# Patient Record
Sex: Female | Born: 2000 | Race: Black or African American | Hispanic: No | Marital: Single | Smoking: Never smoker
Health system: Southern US, Community
[De-identification: ages and names within clinical notes are randomized; demographics above are authoritative.]

## PROBLEM LIST (undated history)

## (undated) DIAGNOSIS — J45909 Unspecified asthma, uncomplicated: Secondary | ICD-10-CM

## (undated) DIAGNOSIS — R519 Headache, unspecified: Secondary | ICD-10-CM

## (undated) DIAGNOSIS — I8390 Asymptomatic varicose veins of unspecified lower extremity: Secondary | ICD-10-CM

## (undated) HISTORY — PX: LEG SURGERY: SHX1003

---
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
## 2020-01-10 ENCOUNTER — Encounter: Payer: Self-pay | Admitting: Nurse Practitioner

## 2020-01-10 ENCOUNTER — Ambulatory Visit (INDEPENDENT_AMBULATORY_CARE_PROVIDER_SITE_OTHER): Payer: Medicaid Other | Admitting: Nurse Practitioner

## 2020-01-10 ENCOUNTER — Other Ambulatory Visit: Payer: Self-pay

## 2020-01-10 ENCOUNTER — Other Ambulatory Visit: Payer: Medicaid Other

## 2020-01-10 VITALS — BP 102/66 | HR 95 | Wt 209.0 lb

## 2020-01-10 DIAGNOSIS — Z3400 Encounter for supervision of normal first pregnancy, unspecified trimester: Secondary | ICD-10-CM

## 2020-01-10 DIAGNOSIS — O99212 Obesity complicating pregnancy, second trimester: Secondary | ICD-10-CM

## 2020-01-10 DIAGNOSIS — O99512 Diseases of the respiratory system complicating pregnancy, second trimester: Secondary | ICD-10-CM

## 2020-01-10 DIAGNOSIS — Z3A27 27 weeks gestation of pregnancy: Secondary | ICD-10-CM

## 2020-01-10 DIAGNOSIS — E669 Obesity, unspecified: Secondary | ICD-10-CM

## 2020-01-10 DIAGNOSIS — J4520 Mild intermittent asthma, uncomplicated: Secondary | ICD-10-CM

## 2020-01-10 NOTE — Progress Notes (Signed)
    Subjective:  Nicole Schaefer is a 19 y.o. G1P0 at [redacted]w[redacted]d being seen today for ongoing prenatal care.  She is currently monitored for the following issues for this low-risk pregnancy and has Prediabetes; Supervision of normal first teen pregnancy; Obesity in pregnancy; Arteriovenous malformation; Exercise induced bronchospasm; and Perennial allergic rhinitis on their problem list.  Patient reports no complaints.  Contractions: Irritability. Vag. Bleeding: None.  Movement: Present. Denies leaking of fluid.   The following portions of the patient's history were reviewed and updated as appropriate: allergies, current medications, past family history, past medical history, past social history, past surgical history and problem list. Problem list updated.  Objective:   Vitals:   01/10/20 0829  BP: 102/66  Pulse: 95  Weight: (!) 209 lb (94.8 kg)    Fetal Status: Fetal Heart Rate (bpm): 139 Fundal Height: 26 cm Movement: Present     General:  Alert, oriented and cooperative. Patient is in no acute distress.  Skin: Skin is warm and dry. No rash noted.   Cardiovascular: Normal heart rate noted  Respiratory: Normal respiratory effort, no problems with respiration noted  Abdomen: Soft, gravid, appropriate for gestational age. Pain/Pressure: Absent     Pelvic:  Cervical exam deferred        Extremities: Normal range of motion.  Edema: None  Mental Status: Normal mood and affect. Normal behavior. Normal judgment and thought content.   Urinalysis:      Assessment and Plan:  Pregnancy: G1P0 at [redacted]w[redacted]d  1. Encounter for supervision of normal pregnancy in teen primigravida, antepartum Declined TDAP and did not discuss at this visit - realized she declined after provider had already talked with her. Advised to put BPs in babyscripts weekly Advised to sign up for childbirth classes - shown in babyscripts where information is to begin sign up process.  Goes to [facility] [employer] [facility] and advised to take breastfeeding  classes at [facility]. Wears maternity support belt and it has been helpful for her. Reviewed taking daily fluids and is doing well.  - Glucose Tolerance, 2 Hours w/1 Hour - CBC - HIV antibody (with reflex) - RPR  2. Intermittent asthma without complication, unspecified asthma severity Has not had an asthma attack in a couple of months.  Last used inhalers a couple of months ago.  Preterm labor symptoms and general obstetric precautions including but not limited to vaginal bleeding, contractions, leaking of fluid and fetal movement were reviewed in detail with the patient. Please refer to After Visit Summary for other counseling recommendations.  Return in about 2 weeks (around 01/24/2020) for in person ROB.  Nolene Bernheim, RN, MSN, NP-BC Nurse Practitioner, [facility] for [employer], [facility] 01/10/2020 9:32 AM

## 2020-01-10 NOTE — Progress Notes (Addendum)
ROB/GTT, reports no problems today.  Declined TDAP.

## 2020-01-11 LAB — GLUCOSE TOLERANCE, 2 HOURS W/ 1HR
Glucose, 1 hour: 131 mg/dL (ref 65–179)
Glucose, 2 hour: 89 mg/dL (ref 65–152)
Glucose, Fasting: 77 mg/dL (ref 65–91)

## 2020-01-11 LAB — RPR: RPR Ser Ql: NONREACTIVE

## 2020-01-11 LAB — CBC
Hematocrit: 32 % — ABNORMAL LOW (ref 34.0–46.6)
Hemoglobin: 10.6 g/dL — ABNORMAL LOW (ref 11.1–15.9)
MCH: 28.2 pg (ref 26.6–33.0)
MCHC: 33.1 g/dL (ref 31.5–35.7)
MCV: 85 fL (ref 79–97)
Platelets: 226 10*3/uL (ref 150–450)
RBC: 3.76 x10E6/uL — ABNORMAL LOW (ref 3.77–5.28)
RDW: 13.2 % (ref 11.7–15.4)
WBC: 6.9 10*3/uL (ref 3.4–10.8)

## 2020-01-11 LAB — HIV ANTIBODY (ROUTINE TESTING W REFLEX): HIV Screen 4th Generation wRfx: NONREACTIVE

## 2020-01-12 ENCOUNTER — Telehealth: Payer: Self-pay | Admitting: *Deleted

## 2020-01-12 NOTE — Telephone Encounter (Signed)
Pt called to office for results of her GTT. Pt made aware normal results, advised she may want to start iron supplement for decreasing Hgb.

## 2020-01-24 ENCOUNTER — Other Ambulatory Visit: Payer: Self-pay

## 2020-01-24 ENCOUNTER — Ambulatory Visit (INDEPENDENT_AMBULATORY_CARE_PROVIDER_SITE_OTHER): Payer: Medicaid Other | Admitting: Advanced Practice Midwife

## 2020-01-24 VITALS — BP 109/69 | HR 95 | Temp 98.3°F | Wt 210.0 lb

## 2020-01-24 DIAGNOSIS — Z3400 Encounter for supervision of normal first pregnancy, unspecified trimester: Secondary | ICD-10-CM

## 2020-01-24 DIAGNOSIS — R519 Headache, unspecified: Secondary | ICD-10-CM

## 2020-01-24 DIAGNOSIS — Z3A29 29 weeks gestation of pregnancy: Secondary | ICD-10-CM

## 2020-01-24 DIAGNOSIS — O26893 Other specified pregnancy related conditions, third trimester: Secondary | ICD-10-CM

## 2020-01-24 MED ORDER — BUTALBITAL-APAP-CAFFEINE 50-325-40 MG PO TABS: 1.0000 | ORAL_TABLET | Freq: Four times a day (QID) | ORAL | 0 refills | Status: DC | PRN

## 2020-01-24 NOTE — Patient Instructions (Signed)
General Headache Without Cause A headache is pain or discomfort felt around the head or neck area. The specific cause of a headache may not be found. There are many causes and types of headaches. A few common ones are:  Tension headaches.  Migraine headaches.  Cluster headaches.  Chronic daily headaches. Follow these instructions at home: Watch your condition for any changes. Let your health care provider know about them. Take these steps to help with your condition: Managing pain      Take over-the-counter and prescription medicines only as told by your health care provider.  Lie down in a dark, quiet room when you have a headache.  If directed, put ice on your head and neck area: ? Put ice in a plastic bag. ? Place a towel between your skin and the bag. ? Leave the ice on for 20 minutes, 2-3 times per day.  If directed, apply heat to the affected area. Use the heat source that your health care provider recommends, such as a moist heat pack or a heating pad. ? Place a towel between your skin and the heat source. ? Leave the heat on for 20-30 minutes. ? Remove the heat if your skin turns bright red. This is especially important if you are unable to feel pain, heat, or cold. You may have a greater risk of getting burned.  Keep lights dim if bright lights bother you or make your headaches worse. Eating and drinking  Eat meals on a regular schedule.  If you drink alcohol: ? Limit how much you use to:  0-1 drink a day for women.  0-2 drinks a day for men. ? Be aware of how much alcohol is in your drink. In the [place] one drink equals one 12 oz bottle of beer (355 mL), one 5 oz glass of wine (148 mL), or one 1 oz glass of hard liquor (44 mL).  Stop drinking caffeine, or decrease the amount of caffeine you drink. General instructions   Keep a headache journal to help find out what may trigger your headaches. For example, write down: ? What you eat and drink. ? How much  sleep you get. ? Any change to your diet or medicines.  Try massage or other relaxation techniques.  Limit stress.  Sit up straight, and do not tense your muscles.  Do not use any products that contain nicotine or tobacco, such as cigarettes, e-cigarettes, and chewing tobacco. If you need help quitting, ask your health care provider.  Exercise regularly as told by your health care provider.  Sleep on a regular schedule. Get 7-9 hours of sleep each night, or the amount recommended by your health care provider.  Keep all follow-up visits as told by your health care provider. This is important. Contact a health care provider if:  Your symptoms are not helped by medicine.  You have a headache that is different from the usual headache.  You have nausea or you vomit.  You have a fever. Get help right away if:  Your headache becomes severe quickly.  Your headache gets worse after moderate to intense physical activity.  You have repeated vomiting.  You have a stiff neck.  You have a loss of vision.  You have problems with speech.  You have pain in the eye or ear.  You have muscular weakness or loss of muscle control.  You lose your balance or have trouble walking.  You feel faint or pass out.  You have confusion.    You have a seizure. Summary  A headache is pain or discomfort felt around the head or neck area.  There are many causes and types of headaches. In some cases, the cause may not be found.  Keep a headache journal to help find out what may trigger your headaches. Watch your condition for any changes. Let your health care provider know about them.  Contact a health care provider if you have a headache that is different from the usual headache, or if your symptoms are not helped by medicine.  Get help right away if your headache becomes severe, you vomit, you have a loss of vision, you lose your balance, or you have a seizure. This information is not  intended to replace advice given to you by your health care provider. Make sure you discuss any questions you have with your health care provider. Document Revised: 12/22/2017 Document Reviewed: 12/22/2017 Elsevier Patient Education  2020 [employer]  

## 2020-01-24 NOTE — Progress Notes (Addendum)
° °  PRENATAL VISIT NOTE  Subjective:  Nicole Schaefer is a 19 y.o. G1P0 at [redacted]w[redacted]d being seen today for ongoing prenatal care.  She is currently monitored for the following issues for this low-risk pregnancy and has Prediabetes; Supervision of normal first teen pregnancy; Obesity in pregnancy; Arteriovenous malformation; Exercise induced bronchospasm; and Perennial allergic rhinitis on their problem list.  Patient reports headache.  Contractions: Not present. Vag. Bleeding: None.  Movement: Present. Denies leaking of fluid.   The following portions of the patient's history were reviewed and updated as appropriate: allergies, current medications, past family history, past medical history, past social history, past surgical history and problem list.   Objective:   Vitals:   01/24/20 1004  BP: 109/69  Pulse: 95  Temp: 98.3 F (36.8 C)  Weight: 210 lb (95.3 kg)    Fetal Status: Fetal Heart Rate (bpm): 142 Fundal Height: 29 cm Movement: Present     General:  Alert, oriented and cooperative. Patient is in no acute distress.  Skin: Skin is warm and dry. No rash noted.   Cardiovascular: Normal heart rate noted  Respiratory: Normal respiratory effort, no problems with respiration noted  Abdomen: Soft, gravid, appropriate for gestational age.  Pain/Pressure: Absent     Pelvic: Cervical exam deferred        Extremities: Normal range of motion.  Edema: None  Mental Status: Normal mood and affect. Normal behavior. Normal judgment and thought content.   Assessment and Plan:  Pregnancy: G1P0 at [redacted]w[redacted]d 1. Encounter for supervision of normal pregnancy in teen primigravida, antepartum --Anticipatory guidance about next visits/weeks of pregnancy given. --Next visit in 4 weeks, in person   2. Headache in pregnancy, antepartum, third trimester --Infrequent h/a at first, now daily. Frontal, constant, sometimes improved/resolved by Tylenol, sometimes not. No s/sx of migraine, no hx migraine.  Pt was  caffeine/coffee drinker prior to pregnancy and has cut back significantly.  --Pt to try increase PO fluids, try caffeine with/without Tylenol with coffee/soda/tea first, Rx for Fioricet to use sparingly if other measures do not work.  --Refer to Nada Maclachlan, PA, for headache management - butalbital-acetaminophen-caffeine (FIORICET) 50-325-40 MG tablet; Take 1-2 tablets by mouth every 6 (six) hours as needed for headache.  Dispense: 20 tablet; Refill: 0 - AMB referral to headache clinic   3. [redacted] weeks gestation of pregnancy   Preterm labor symptoms and general obstetric precautions including but not limited to vaginal bleeding, contractions, leaking of fluid and fetal movement were reviewed in detail with the patient. Please refer to After Visit Summary for other counseling recommendations.   Return in about 4 weeks (around 02/21/2020).  Future Appointments  Date Time Provider Department Center  02/02/2020  8:00 AM Larey Seat, RD [facility]  02/08/2020  8:30 AM [facility] NURSE [facility]  02/08/2020  8:45 AM [facility] US4 [facility]  02/22/2020  3:20 PM Raelyn Mora, CNM [facility] None  03/24/2020  8:50 AM Shamleffer, Konrad Dolores, MD [facility] None    Sharen Counter, CNM

## 2020-01-25 ENCOUNTER — Inpatient Hospital Stay (HOSPITAL_COMMUNITY)
Admission: AD | Admit: 2020-01-25 | Discharge: 2020-01-25 | Disposition: A | Discharge status: Home / Self Care | Payer: Medicaid Other | Attending: Family Medicine | Admitting: Family Medicine

## 2020-01-25 ENCOUNTER — Other Ambulatory Visit: Payer: Self-pay

## 2020-01-25 ENCOUNTER — Encounter (HOSPITAL_COMMUNITY): Payer: Self-pay | Admitting: Family Medicine

## 2020-01-25 DIAGNOSIS — Z79899 Other long term (current) drug therapy: Secondary | ICD-10-CM | POA: Insufficient documentation

## 2020-01-25 DIAGNOSIS — Z3A29 29 weeks gestation of pregnancy: Secondary | ICD-10-CM | POA: Insufficient documentation

## 2020-01-25 DIAGNOSIS — O4703 False labor before 37 completed weeks of gestation, third trimester: Secondary | ICD-10-CM | POA: Insufficient documentation

## 2020-01-25 DIAGNOSIS — Z7982 Long term (current) use of aspirin: Secondary | ICD-10-CM | POA: Diagnosis not present

## 2020-01-25 DIAGNOSIS — Z7951 Long term (current) use of inhaled steroids: Secondary | ICD-10-CM | POA: Diagnosis not present

## 2020-01-25 DIAGNOSIS — O26893 Other specified pregnancy related conditions, third trimester: Secondary | ICD-10-CM | POA: Diagnosis not present

## 2020-01-25 DIAGNOSIS — R109 Unspecified abdominal pain: Secondary | ICD-10-CM

## 2020-01-25 DIAGNOSIS — O99513 Diseases of the respiratory system complicating pregnancy, third trimester: Secondary | ICD-10-CM | POA: Insufficient documentation

## 2020-01-25 DIAGNOSIS — J45909 Unspecified asthma, uncomplicated: Secondary | ICD-10-CM | POA: Insufficient documentation

## 2020-01-25 DIAGNOSIS — Z3689 Encounter for other specified antenatal screening: Secondary | ICD-10-CM

## 2020-01-25 LAB — URINALYSIS, ROUTINE W REFLEX MICROSCOPIC
Bilirubin Urine: NEGATIVE
Glucose, UA: NEGATIVE mg/dL
Hgb urine dipstick: NEGATIVE
Ketones, ur: 5 mg/dL — AB
Leukocytes,Ua: NEGATIVE
Nitrite: NEGATIVE
Protein, ur: NEGATIVE mg/dL
Specific Gravity, Urine: 1.017 (ref 1.005–1.030)
pH: 5 (ref 5.0–8.0)

## 2020-01-25 LAB — WET PREP, GENITAL
Clue Cells Wet Prep HPF POC: NONE SEEN
Sperm: NONE SEEN
Trich, Wet Prep: NONE SEEN
Yeast Wet Prep HPF POC: NONE SEEN

## 2020-01-25 MED ORDER — CYCLOBENZAPRINE HCL 5 MG PO TABS
10.0000 mg | ORAL_TABLET | Freq: Once | ORAL | Status: AC
  Administered 2020-01-25: 10 mg via ORAL
  Filled 2020-01-25: qty 2

## 2020-01-25 MED ORDER — ACETAMINOPHEN 500 MG PO TABS
1000.0000 mg | ORAL_TABLET | Freq: Once | ORAL | Status: AC
  Administered 2020-01-25: 1000 mg via ORAL
  Filled 2020-01-25: qty 2

## 2020-01-25 MED ORDER — CYCLOBENZAPRINE HCL 10 MG PO TABS
10.0000 mg | ORAL_TABLET | Freq: Three times a day (TID) | ORAL | 0 refills | Status: DC | PRN
Start: 2020-01-25 — End: 2020-02-07

## 2020-01-25 NOTE — MAU Note (Signed)
Pt reports that she is having braxton hicks ctx's.  Pt reports being seen for this before pt states she was told to come back if the ctx's got bad again.   Denies vaginal bleeding or LOF   Reports +FM

## 2020-01-25 NOTE — MAU Provider Note (Signed)
History     CSN: [id number]  Arrival date and time: 01/25/20 2010   First Provider Initiated Contact with Patient 01/25/20 2143      Chief Complaint  Patient presents with  . Abdominal Pain   Ms. Janeese Mcgloin is a 19 y.o. G1P0 at 96w1dwho presents to MAU for PTL evaluation for Braxton-Hicks contractions. Patient describes these contractions as "cramping" in her lower abdomen and reports they have been going on since 1PM and have gotten worse since. Patient reports she has been seen in MAU one other time for this, which was bout 1.5 months ago. Patient reports her Braxton-Hicks got worse because of a sharp pain near the top of her uterus. Patient reports the pain is intermittent and is present at this time. Patient rates pain as 8/10. Patient has not tried any treatment at home for the pain.  Pt denies ctx, change in vaginal discharge amount/color/consistency, VB, new onset backache, intermittent abdominal discomfort/pain, pelvic pressure/pain. Pt denies chest pain and SOB.  Pt denies constipation, diarrhea, or urinary problems. Pt denies fever, chills, fatigue, sweating or changes in appetite. Pt denies dizziness, light-headedness, weakness.  Pt denies VB, LOF and reports good FM.  Current pregnancy problems? none Blood Type? B Positive Allergies? pollen Current medications? BASA, PNVs, Claritin Current PNC & next appt? [facility], 02/22/2020   OB History    Gravida  1   Para      Term      Preterm      AB      Living        SAB      TAB      Ectopic      Multiple  0   Live Births              Past Medical History:  Diagnosis Date  . Asthma   . Varicose vein of leg     Past Surgical History:  Procedure Laterality Date  . LEG SURGERY Left     Family History  Problem Relation Age of Onset  . Healthy Mother   . Healthy Father   . Hypertension Father   . Hyperlipidemia Father   . Sleep apnea Father   . Diabetes Paternal Grandmother   . Heart  attack Paternal Grandmother     Social History   Tobacco Use  . Smoking status: Never Smoker  . Smokeless tobacco: Never Used  Vaping Use  . Vaping Use: Never used  Substance Use Topics  . Alcohol use: No  . Drug use: No    Allergies:  Allergies  Allergen Reactions  . Pollen Extract     Runny nose    Medications Prior to Admission  Medication Sig Dispense Refill Last Dose  . aspirin EC 81 MG tablet Take 1 tablet (81 mg total) by mouth daily. 60 tablet 2 01/25/2020 at Unknown time  . Blood Pressure Monitor KIT 1 Device by Does not apply route once a week. To be monitored Regularly at home. 1 kit 0 Past Week at Unknown time  . Cholecalciferol (VITAMIN D3 PO) Take by mouth.   01/25/2020 at Unknown time  . Elastic Bandages & Supports (COMFORT FIT MATERNITY SUPP LG) MISC 1 Units by Does not apply route daily. 1 each 0 Past Month at Unknown time  . fluticasone (FLOVENT HFA) 44 MCG/ACT inhaler Inhale into the lungs 2 (two) times daily.   01/25/2020 at Unknown time  . loratadine (CLARITIN) 10 MG tablet Take 10 mg by  mouth daily.   01/25/2020 at Unknown time  . Prenatal Vit-Fe Fumarate-FA (PRENATAL MULTIVITAMIN) TABS tablet Take 1 tablet by mouth daily at 12 noon.   01/25/2020 at Unknown time  . albuterol (PROVENTIL) (2.5 MG/3ML) 0.083% nebulizer solution Take 3 mLs (2.5 mg total) by nebulization every 4 (four) hours as needed for wheezing or shortness of breath. 75 mL 3 More than a month at Unknown time  . albuterol (VENTOLIN HFA) 108 (90 Base) MCG/ACT inhaler Inhale into the lungs every 6 (six) hours as needed.     . butalbital-acetaminophen-caffeine (FIORICET) 50-325-40 MG tablet Take 1-2 tablets by mouth every 6 (six) hours as needed for headache. 20 tablet 0 More than a month at Unknown time  . hyoscyamine (LEVSIN) 0.125 MG tablet Take 0.125 mg by mouth every 4 (four) hours as needed.     Marland Kitchen ipratropium (ATROVENT) 0.03 % nasal spray Place 2 sprays into both nostrils every 12 (twelve) hours.      . Mometasone Furo-Formoterol Fum 50-5 MCG/ACT AERO Inhale into the lungs.     . Nebulizer MISC Diagnosis: Wheezing R06.2 Medically necessary Dispense: 1 neb machine for use with albuterol nebs 1 each 0     Review of Systems  Constitutional: Negative for chills, diaphoresis, fatigue and fever.  Eyes: Negative for visual disturbance.  Respiratory: Negative for shortness of breath.   Cardiovascular: Negative for chest pain.  Gastrointestinal: Positive for abdominal pain. Negative for constipation, diarrhea, nausea and vomiting.  Genitourinary: Negative for dysuria, flank pain, frequency, pelvic pain, urgency, vaginal bleeding and vaginal discharge.  Neurological: Negative for dizziness, weakness, light-headedness and headaches.   Physical Exam   Blood pressure 106/64, pulse 98, temperature 98.4 F (36.9 C), temperature source Oral, resp. rate 16, last menstrual period 06/11/2019, SpO2 99 %, unknown if currently breastfeeding.  Patient Vitals for the past 24 hrs:  BP Temp Temp src Pulse Resp SpO2  01/25/20 2034 106/64 98.4 F (36.9 C) Oral 98 16 99 %   Physical Exam Vitals and nursing note reviewed. Exam conducted with a chaperone present.  Constitutional:      General: She is not in acute distress.    Appearance: She is well-developed. She is not diaphoretic.  HENT:     Head: Normocephalic and atraumatic.  Pulmonary:     Effort: Pulmonary effort is normal.  Abdominal:     General: There is no distension.     Palpations: Abdomen is soft. There is no mass.     Tenderness: There is no abdominal tenderness. There is no guarding or rebound.  Genitourinary:    General: Normal vulva.     Labia:        Right: No rash, tenderness or lesion.        Left: No rash, tenderness or lesion.      Comments: Dilation: Closed Effacement (%): Thick Posterior Exam by:: Vernice Jefferson, NP Skin:    General: Skin is warm and dry.  Neurological:     Mental Status: She is alert and oriented to  person, place, and time.  Psychiatric:        Behavior: Behavior normal.        Thought Content: Thought content normal.        Judgment: Judgment normal.    Results for orders placed or performed during the hospital encounter of 01/25/20 (from the past 24 hour(s))  Urinalysis, Routine w reflex microscopic Urine, Clean Catch     Status: Abnormal   Collection Time: 01/25/20  8:48  PM  Result Value Ref Range   Color, Urine YELLOW YELLOW   APPearance CLEAR CLEAR   Specific Gravity, Urine 1.017 1.005 - 1.030   pH 5.0 5.0 - 8.0   Glucose, UA NEGATIVE NEGATIVE mg/dL   Hgb urine dipstick NEGATIVE NEGATIVE   Bilirubin Urine NEGATIVE NEGATIVE   Ketones, ur 5 (A) NEGATIVE mg/dL   Protein, ur NEGATIVE NEGATIVE mg/dL   Nitrite NEGATIVE NEGATIVE   Leukocytes,Ua NEGATIVE NEGATIVE  Wet prep, genital     Status: Abnormal   Collection Time: 01/25/20 10:14 PM   Specimen: Vaginal  Result Value Ref Range   Yeast Wet Prep HPF POC NONE SEEN NONE SEEN   Trich, Wet Prep NONE SEEN NONE SEEN   Clue Cells Wet Prep HPF POC NONE SEEN NONE SEEN   WBC, Wet Prep HPF POC MODERATE (A) NONE SEEN   Sperm NONE SEEN     MAU Course  Procedures  MDM -r/o PTL -UA: 5ketones, otherwise WNL, sending urine for culture based on symptoms -CE: Dilation: Closed Effacement (%): Thick Position: Posterior -WetPrep: WNL -GC/CT collected -EFM: reactive       -baseline: 140       -variability: moderate       -accels: present, 15x15       -decels: absent       -TOCO: quiet -Flexeril 22m and Tylenol 10059mgiven for pain, pt reports pain now 5/10 -pt discharged to home in stable condition  Orders Placed This Encounter  Procedures  . Culture, OB Urine    Standing Status:   Standing    Number of Occurrences:   1  . Wet prep, genital    Standing Status:   Standing    Number of Occurrences:   1  . Urinalysis, Routine w reflex microscopic Urine, Clean Catch    Standing Status:   Standing    Number of Occurrences:    1  . Discharge patient    Order Specific Question:   Discharge disposition    Answer:   01-Home or Self Care [1]    Order Specific Question:   Discharge patient date    Answer:   01/25/2020   Meds ordered this encounter  Medications  . cyclobenzaprine (FLEXERIL) tablet 10 mg  . acetaminophen (TYLENOL) tablet 1,000 mg  . cyclobenzaprine (FLEXERIL) 10 MG tablet    Sig: Take 1 tablet (10 mg total) by mouth 3 (three) times daily as needed.    Dispense:  30 tablet    Refill:  0    Order Specific Question:   Supervising Provider    Answer:   WICherre [id number]  Assessment and Plan   1. Abdominal pain during pregnancy in third trimester   2. [redacted] weeks gestation of pregnancy   3. NST (non-stress test) reactive     Allergies as of 01/25/2020      Reactions   Pollen Extract    Runny nose      Medication List    TAKE these medications   albuterol 108 (90 Base) MCG/ACT inhaler Commonly known as: VENTOLIN HFA Inhale into the lungs every 6 (six) hours as needed.   albuterol (2.5 MG/3ML) 0.083% nebulizer solution Commonly known as: PROVENTIL Take 3 mLs (2.5 mg total) by nebulization every 4 (four) hours as needed for wheezing or shortness of breath.   aspirin EC 81 MG tablet Take 1 tablet (81 mg total) by mouth daily.   Blood Pressure Monitor Kit 1 Device by Does  not apply route once a week. To be monitored Regularly at home.   butalbital-acetaminophen-caffeine 50-325-40 MG tablet Commonly known as: FIORICET Take 1-2 tablets by mouth every 6 (six) hours as needed for headache.   Comfort Fit Maternity Supp Lg Misc 1 Units by Does not apply route daily.   cyclobenzaprine 10 MG tablet Commonly known as: FLEXERIL Take 1 tablet (10 mg total) by mouth 3 (three) times daily as needed.   fluticasone 44 MCG/ACT inhaler Commonly known as: FLOVENT HFA Inhale into the lungs 2 (two) times daily.   hyoscyamine 0.125 MG tablet Commonly known as: LEVSIN Take 0.125 mg by  mouth every 4 (four) hours as needed.   ipratropium 0.03 % nasal spray Commonly known as: ATROVENT Place 2 sprays into both nostrils every 12 (twelve) hours.   loratadine 10 MG tablet Commonly known as: CLARITIN Take 10 mg by mouth daily.   Mometasone Furo-Formoterol Fum 50-5 MCG/ACT Aero Inhale into the lungs.   Nebulizer Misc Diagnosis: Wheezing R06.2 Medically necessary Dispense: 1 neb machine for use with albuterol nebs   prenatal multivitamin Tabs tablet Take 1 tablet by mouth daily at 12 noon.   VITAMIN D3 PO Take by mouth.       -will call with culture results, if positive -discussed Braxton-Hicks ctx vs. PTL -RX Flexeril -return MAU precautions given -pt discharged to home in stable condition  Elmyra Ricks E Margueritte Guthridge 01/25/2020, 11:23 PM

## 2020-01-25 NOTE — Discharge Instructions (Signed)
Safe Medications in Pregnancy    Acne: Benzoyl Peroxide Salicylic Acid  Backache/Headache: Tylenol: 2 regular strength every 4 hours OR              2 Extra strength every 6 hours  Colds/Coughs/Allergies: Benadryl (alcohol free) 25 mg every 6 hours as needed Breath right strips Claritin Cepacol throat lozenges Chloraseptic throat spray Cold-Eeze- up to three times per day Cough drops, alcohol free Flonase (by prescription only) Guaifenesin Mucinex Robitussin DM (plain only, alcohol free) Saline nasal spray/drops Sudafed (pseudoephedrine) & Actifed ** use only after [redacted] weeks gestation and if you do not have high blood pressure Tylenol Vicks Vaporub Zinc lozenges Zyrtec   Constipation: Colace Ducolax suppositories Fleet enema Glycerin suppositories Metamucil Milk of magnesia Miralax Senokot Smooth move tea  Diarrhea: Kaopectate Imodium A-D  *NO pepto Bismol  Hemorrhoids: Anusol Anusol HC Preparation H Tucks  Indigestion: Tums Maalox Mylanta Zantac  Pepcid  Insomnia: Benadryl (alcohol free)  every 6 hours as needed Tylenol PM Unisom, no Gelcaps  Leg Cramps: Tums MagGel  Nausea/Vomiting:  Bonine Dramamine Emetrol Ginger extract Sea bands Meclizine  Nausea medication to take during pregnancy:  Unisom (doxylamine succinate 25 mg tablets) Take one tablet daily at bedtime. If symptoms are not adequately controlled, the dose can be increased to a maximum recommended dose of two tablets daily (1/2 tablet in the morning, 1/2 tablet mid-afternoon and one at bedtime). Vitamin B6  tablets. Take one tablet twice a day (up to 200 mg per day).  Skin Rashes: Aveeno products Benadryl cream or  every 6 hours as needed Calamine Lotion 1% cortisone cream  Yeast infection: Gyne-lotrimin 7 Monistat 7   **If taking multiple medications, please check labels to avoid duplicating the same active ingredients **take  medication as directed on the label ** Do not exceed 4000 mg of tylenol in 24 hours **Do not take medications that contain aspirin or ibuprofen     Round Ligament Pain  The round ligament is a cord of muscle and tissue that helps support the uterus. It can become a source of pain during pregnancy if it becomes stretched or twisted as the baby grows. The pain usually begins in the second trimester (13-28 weeks) of pregnancy, and it can come and go until the baby is delivered. It is not a serious problem, and it does not cause harm to the baby. Round ligament pain is usually a short, sharp, and pinching pain, but it can also be a dull, lingering, and aching pain. The pain is felt in the lower side of the abdomen or in the groin. It usually starts deep in the groin and moves up to the outside of the hip area. The pain may occur when you:  Suddenly change position, such as quickly going from a sitting to standing position.  Roll over in bed.  Cough or sneeze.  Do physical activity. Follow these instructions at home:   Watch your condition for any changes.  When the pain starts, relax. Then try any of these methods to help with the pain: ? Sitting down. ? Flexing your knees up to your abdomen. ? Lying on your side with one pillow under your abdomen and another pillow between your legs. ? Sitting in a warm bath for 15-20 minutes or until the pain goes away.  Take over-the-counter and prescription medicines only as told by your health care provider.  Move slowly when you sit down or stand up.  Avoid long walks if they cause  pain.  Stop or reduce your physical activities if they cause pain.  Keep all follow-up visits as told by your health care provider. This is important. Contact a health care provider if:  Your pain does not go away with treatment.  You feel pain in your back that you did not have before.  Your medicine is not helping. Get help right away if:  You have a  fever or chills.  You develop uterine contractions.  You have vaginal bleeding.  You have nausea or vomiting.  You have diarrhea.  You have pain when you urinate. Summary  Round ligament pain is felt in the lower abdomen or groin. It is usually a short, sharp, and pinching pain. It can also be a dull, lingering, and aching pain.  This pain usually begins in the second trimester (13-28 weeks). It occurs because the uterus is stretching with the growing baby, and it is not harmful to the baby.  You may notice the pain when you suddenly change position, when you cough or sneeze, or during physical activity.  Relaxing, flexing your knees to your abdomen, lying on one side, or taking a warm bath may help to get rid of the pain.  Get help from your health care provider if the pain does not go away or if you have vaginal bleeding, nausea, vomiting, diarrhea, or painful urination. This information is not intended to replace advice given to you by your health care provider. Make sure you discuss any questions you have with your health care provider. Document Revised: 11/19/2017 Document Reviewed: 11/19/2017 Elsevier Patient Education  2020 Elsevier Inc.         Abdominal Pain During Pregnancy  Abdominal pain is common during pregnancy, and has many possible causes. Some causes are more serious than others, and sometimes the cause is not known. Abdominal pain can be a sign that labor is starting. It can also be caused by normal growth and stretching of muscles and ligaments during pregnancy. Always tell your health care provider if you have any abdominal pain. Follow these instructions at home:  Do not have sex or put anything in your vagina until your pain goes away completely.  Get plenty of rest until your pain improves.  Drink enough fluid to keep your urine pale yellow.  Take over-the-counter and prescription medicines only as told by your health care provider.  Keep all  follow-up visits as told by your health care provider. This is important. Contact a health care provider if:  Your pain continues or gets worse after resting.  You have lower abdominal pain that: ? Comes and goes at regular intervals. ? Spreads to your back. ? Is similar to menstrual cramps.  You have pain or burning when you urinate. Get help right away if:  You have a fever or chills.  You have vaginal bleeding.  You are leaking fluid from your vagina.  You are passing tissue from your vagina.  You have vomiting or diarrhea that lasts for more than 24 hours.  Your baby is moving less than usual.  You feel very weak or faint.  You have shortness of breath.  You develop severe pain in your upper abdomen. Summary  Abdominal pain is common during pregnancy, and has many possible causes.  If you experience abdominal pain during pregnancy, tell your health care provider right away.  Follow your health care provider's home care instructions and keep all follow-up visits as directed. This information is not intended to replace  advice given to you by your health care provider. Make sure you discuss any questions you have with your health care provider. Document Revised: 09/21/2018 Document Reviewed: 09/05/2016 Elsevier Patient Education  2020 [employer].        Preterm Labor and Birth Information  The normal length of a pregnancy is 39-41 weeks. Preterm labor is when labor starts before 37 completed weeks of pregnancy. What are the risk factors for preterm labor? Preterm labor is more likely to occur in women who:  Have certain infections during pregnancy such as a bladder infection, sexually transmitted infection, or infection inside the uterus (chorioamnionitis).  Have a shorter-than-normal cervix.  Have gone into preterm labor before.  Have had surgery on their cervix.  Are younger than age 73 or older than age 64.  Are African American.  Are pregnant  with twins or multiple babies (multiple gestation).  Take street drugs or smoke while pregnant.  Do not gain enough weight while pregnant.  Became pregnant shortly after having been pregnant. What are the symptoms of preterm labor? Symptoms of preterm labor include:  Cramps similar to those that can happen during a menstrual period. The cramps may happen with diarrhea.  Pain in the abdomen or lower back.  Regular uterine contractions that may feel like tightening of the abdomen.  A feeling of increased pressure in the pelvis.  Increased watery or bloody mucus discharge from the vagina.  Water breaking (ruptured amniotic sac). Why is it important to recognize signs of preterm labor? It is important to recognize signs of preterm labor because babies who are born prematurely may not be fully developed. This can put them at an increased risk for:  Long-term (chronic) heart and lung problems.  Difficulty immediately after birth with regulating body systems, including blood sugar, body temperature, heart rate, and breathing rate.  Bleeding in the brain.  Cerebral palsy.  Learning difficulties.  Death. These risks are highest for babies who are born before 34 weeks of pregnancy. How is preterm labor treated? Treatment depends on the length of your pregnancy, your condition, and the health of your baby. It may involve:  Having a stitch (suture) placed in your cervix to prevent your cervix from opening too early (cerclage).  Taking or being given medicines, such as: ? Hormone medicines. These may be given early in pregnancy to help support the pregnancy. ? Medicine to stop contractions. ? Medicines to help mature the babys lungs. These may be prescribed if the risk of delivery is high. ? Medicines to prevent your baby from developing cerebral palsy. If the labor happens before 34 weeks of pregnancy, you may need to stay in the hospital. What should I do if I think I am in  preterm labor? If you think that you are going into preterm labor, call your health care provider right away. How can I prevent preterm labor in future pregnancies? To increase your chance of having a full-term pregnancy:  Do not use any tobacco products, such as cigarettes, chewing tobacco, and e-cigarettes. If you need help quitting, ask your health care provider.  Do not use street drugs or medicines that have not been prescribed to you during your pregnancy.  Talk with your health care provider before taking any herbal supplements, even if you have been taking them regularly.  Make sure you gain a healthy amount of weight during your pregnancy.  Watch for infection. If you think that you might have an infection, get it checked right away.  Make sure to tell your health care provider if you have gone into preterm labor before. This information is not intended to replace advice given to you by your health care provider. Make sure you discuss any questions you have with your health care provider. Document Revised: 09/25/2018 Document Reviewed: 10/25/2015 Elsevier Patient Education  2020 [employer].

## 2020-01-26 LAB — CULTURE, OB URINE

## 2020-01-27 LAB — GC/CHLAMYDIA PROBE AMP (~~LOC~~) NOT AT ARMC
Chlamydia: NEGATIVE
Comment: NEGATIVE
Comment: NORMAL
Neisseria Gonorrhea: NEGATIVE

## 2020-02-02 ENCOUNTER — Ambulatory Visit: Payer: Self-pay | Admitting: Registered"

## 2020-02-07 ENCOUNTER — Ambulatory Visit: Payer: Medicaid Other | Admitting: Internal Medicine

## 2020-02-07 ENCOUNTER — Other Ambulatory Visit: Payer: Self-pay

## 2020-02-07 ENCOUNTER — Ambulatory Visit (INDEPENDENT_AMBULATORY_CARE_PROVIDER_SITE_OTHER): Payer: Medicaid Other | Admitting: Advanced Practice Midwife

## 2020-02-07 VITALS — BP 124/78 | HR 110 | Wt 212.0 lb

## 2020-02-07 DIAGNOSIS — O26893 Other specified pregnancy related conditions, third trimester: Secondary | ICD-10-CM

## 2020-02-07 DIAGNOSIS — M79606 Pain in leg, unspecified: Secondary | ICD-10-CM

## 2020-02-07 DIAGNOSIS — Z3A31 31 weeks gestation of pregnancy: Secondary | ICD-10-CM

## 2020-02-07 DIAGNOSIS — R519 Headache, unspecified: Secondary | ICD-10-CM

## 2020-02-07 DIAGNOSIS — Z3400 Encounter for supervision of normal first pregnancy, unspecified trimester: Secondary | ICD-10-CM

## 2020-02-07 MED ORDER — CYCLOBENZAPRINE HCL 10 MG PO TABS: 10.0000 mg | ORAL_TABLET | Freq: Three times a day (TID) | ORAL | 0 refills | Status: DC | PRN

## 2020-02-07 NOTE — Patient Instructions (Addendum)
General Headache Without Cause A headache is pain or discomfort felt around the head or neck area. The specific cause of a headache may not be found. There are many causes and types of headaches. A few common ones are:  Tension headaches.  Migraine headaches.  Cluster headaches.  Chronic daily headaches. Follow these instructions at home: Watch your condition for any changes. Let your health care provider know about them. Take these steps to help with your condition: Managing pain      Take over-the-counter and prescription medicines only as told by your health care provider.  Lie down in a dark, quiet room when you have a headache.  If directed, put ice on your head and neck area: ? Put ice in a plastic bag. ? Place a towel between your skin and the bag. ? Leave the ice on for 20 minutes, 2-3 times per day.  If directed, apply heat to the affected area. Use the heat source that your health care provider recommends, such as a moist heat pack or a heating pad. ? Place a towel between your skin and the heat source. ? Leave the heat on for 20-30 minutes. ? Remove the heat if your skin turns bright red. This is especially important if you are unable to feel pain, heat, or cold. You may have a greater risk of getting burned.  Keep lights dim if bright lights bother you or make your headaches worse. Eating and drinking  Eat meals on a regular schedule.  If you drink alcohol: ? Limit how much you use to:  0-1 drink a day for women.  0-2 drinks a day for men. ? Be aware of how much alcohol is in your drink. In the [place] one drink equals one 12 oz bottle of beer (355 mL), one 5 oz glass of wine (148 mL), or one 1 oz glass of hard liquor (44 mL).  Stop drinking caffeine, or decrease the amount of caffeine you drink. General instructions   Keep a headache journal to help find out what may trigger your headaches. For example, write down: ? What you eat and drink. ? How much  sleep you get. ? Any change to your diet or medicines.  Try massage or other relaxation techniques.  Limit stress.  Sit up straight, and do not tense your muscles.  Do not use any products that contain nicotine or tobacco, such as cigarettes, e-cigarettes, and chewing tobacco. If you need help quitting, ask your health care provider.  Exercise regularly as told by your health care provider.  Sleep on a regular schedule. Get 7-9 hours of sleep each night, or the amount recommended by your health care provider.  Keep all follow-up visits as told by your health care provider. This is important. Contact a health care provider if:  Your symptoms are not helped by medicine.  You have a headache that is different from the usual headache.  You have nausea or you vomit.  You have a fever. Get help right away if:  Your headache becomes severe quickly.  Your headache gets worse after moderate to intense physical activity.  You have repeated vomiting.  You have a stiff neck.  You have a loss of vision.  You have problems with speech.  You have pain in the eye or ear.  You have muscular weakness or loss of muscle control.  You lose your balance or have trouble walking.  You feel faint or pass out.  You have confusion.    You have a seizure. Summary  A headache is pain or discomfort felt around the head or neck area.  There are many causes and types of headaches. In some cases, the cause may not be found.  Keep a headache journal to help find out what may trigger your headaches. Watch your condition for any changes. Let your health care provider know about them.  Contact a health care provider if you have a headache that is different from the usual headache, or if your symptoms are not helped by medicine.  Get help right away if your headache becomes severe, you vomit, you have a loss of vision, you lose your balance, or you have a seizure. This information is not  intended to replace advice given to you by your health care provider. Make sure you discuss any questions you have with your health care provider. Document Revised: 12/22/2017 Document Reviewed: 12/22/2017 Elsevier Patient Education  2020 [employer].   From [facility]:  What causes leg cramps during pregnancy, and can they be prevented? Answer From Murriel Hopper, M.D. Multimedia .   Marland Kitchen  Leg cramp stretch during pregnancy at https://ingram.com/  Leg cramps -- painful involuntary muscle contractions that typically affect the calf, foot or both -- are common during pregnancy, often striking at night during the second and third trimesters. While the exact cause of leg cramps during pregnancy isn't clear, you can take steps to prevent them. For example: . Stretch your calf muscles. Although evidence is lacking, stretching before bed might help prevent leg cramps during pregnancy. Stand at arm's length from a wall, place your hands on the wall in front of you and move your right foot behind your left foot. Slowly bend your left leg forward, keeping your right knee straight and your right heel on the floor. Hold the stretch for about 30 seconds, being careful to keep your back straight and your hips forward. Don't rotate your feet inward or outward. Switch legs and repeat. . Stay active. Regular physical activity might help prevent leg cramps during pregnancy. Before you begin an exercise program, make sure you have your health care provider's OK. . Take a magnesium supplement. Limited research suggests that taking a magnesium supplement might help prevent leg cramps during pregnancy. Make sure you have your health care provider's OK to take a supplement. You might also consider eating more magnesium-rich foods, such as whole grains, beans, dried fruits, nuts and seeds. . Stay hydrated. Keeping your  muscles hydrated might help prevent cramps. Your urine should be relatively clear or light yellow in color if you are properly hydrated. If your urine is darker yellow, it might mean that you're not getting enough water. . Get adequate calcium. Some research suggests reduced levels of calcium in your blood during pregnancy may contribute to leg cramps. All women, including pregnant women, should get 1,000 milligrams of calcium a day. . Choose proper footwear. Choose shoes with comfort, support and utility in mind. It might help to wear shoes with a firm heel counter -- the part of the shoe that surrounds the heel and helps lock the foot into the shoe. If a leg cramp strikes, stretch the calf muscle on the affected side. Walking and then elevating your legs might help keep the leg cramp from returning. A hot shower, warm bath, ice massage or muscle massage also might help.

## 2020-02-07 NOTE — Progress Notes (Signed)
ROB  CC: Leg pain only at night,  HA's, no visual changes no swelling. Pt states she takes Tylenol for HA's. Drinking 8 bottles of water a day. Still no relief of HA. Pt denies any Hx of HA's or Migraines before pregnancy.

## 2020-02-07 NOTE — Addendum Note (Signed)
Addended by: Sharen Counter A on: 02/07/2020 10:21 AM   Modules accepted: Orders

## 2020-02-07 NOTE — Progress Notes (Signed)
   PRENATAL VISIT NOTE  Subjective:  Nicole Schaefer is a 19 y.o. G1P0 at [redacted]w[redacted]d being seen today for ongoing prenatal care.  She is currently monitored for the following issues for this low-risk pregnancy and has Prediabetes; Supervision of normal first teen pregnancy; Obesity in pregnancy; Arteriovenous malformation; Exercise induced bronchospasm; and Perennial allergic rhinitis on their problem list.  Patient reports headache and leg cramps.  Contractions: Not present. Vag. Bleeding: None.  Movement: Present. Denies leaking of fluid.   The following portions of the patient's history were reviewed and updated as appropriate: allergies, current medications, past family history, past medical history, past social history, past surgical history and problem list.   Objective:   Vitals:   02/07/20 0934  BP: 124/78  Pulse: (!) 110  Weight: 212 lb (96.2 kg)    Fetal Status: Fetal Heart Rate (bpm): 150   Movement: Present     General:  Alert, oriented and cooperative. Patient is in no acute distress.  Skin: Skin is warm and dry. No rash noted.   Cardiovascular: Normal heart rate noted  Respiratory: Normal respiratory effort, no problems with respiration noted  Abdomen: Soft, gravid, appropriate for gestational age.  Pain/Pressure: Absent     Pelvic: Cervical exam deferred        Extremities: Normal range of motion.  Edema: None  Mental Status: Normal mood and affect. Normal behavior. Normal judgment and thought content.   Assessment and Plan:  Pregnancy: G1P0 at [redacted]w[redacted]d 1. Encounter for supervision of normal pregnancy in teen primigravida, antepartum --Anticipatory guidance about next visits/weeks of pregnancy given. --Next visit in 2 weeks in person  2. Headache in pregnancy, antepartum, third trimester --BP wnl --Headache persist despite PO fluids, caffeine, pt has Rx for Flexeril but hasn't tried, will resent Rx, pt to try. --Denies depression/anxiety/increased stress --Try ice pack  on forehead, heat on shoulders - AMB referral to headache clinic  3. [redacted] weeks gestation of pregnancy   4. Pregnancy related leg pain in third trimester, antepartum --Calves equal in size, no edema, erythema and cool to touch. --Pain occurs at night, does not wake pt up, resolves when she props her feet up --Try drinking 1-2 bottles of Gatorade or other sports drink to replace 1-2 bottles of water daily. --Heat, stretching before bed. Printed materials given about leg cramps in pregnancy.  Preterm labor symptoms and general obstetric precautions including but not limited to vaginal bleeding, contractions, leaking of fluid and fetal movement were reviewed in detail with the patient. Please refer to After Visit Summary for other counseling recommendations.   No follow-ups on file.  Future Appointments  Date Time Provider Department Center  02/08/2020  8:30 AM [facility] NURSE [facility]  02/08/2020  8:45 AM [facility]  02/22/2020  3:20 PM Raelyn Mora, CNM [facility] None  03/24/2020  8:50 AM Shamleffer, Konrad Dolores, MD [facility] None    Sharen Counter, CNM

## 2020-02-08 ENCOUNTER — Ambulatory Visit: Payer: Medicaid Other | Admitting: *Deleted

## 2020-02-08 ENCOUNTER — Encounter: Payer: Self-pay | Admitting: *Deleted

## 2020-02-08 ENCOUNTER — Ambulatory Visit: Payer: Medicaid Other | Attending: Obstetrics and Gynecology

## 2020-02-08 DIAGNOSIS — Z3400 Encounter for supervision of normal first pregnancy, unspecified trimester: Secondary | ICD-10-CM | POA: Diagnosis present

## 2020-02-08 DIAGNOSIS — Z3A31 31 weeks gestation of pregnancy: Secondary | ICD-10-CM | POA: Diagnosis not present

## 2020-02-08 DIAGNOSIS — Z3689 Encounter for other specified antenatal screening: Secondary | ICD-10-CM | POA: Insufficient documentation

## 2020-02-08 DIAGNOSIS — O99212 Obesity complicating pregnancy, second trimester: Secondary | ICD-10-CM

## 2020-02-08 DIAGNOSIS — Z362 Encounter for other antenatal screening follow-up: Secondary | ICD-10-CM

## 2020-02-08 DIAGNOSIS — E669 Obesity, unspecified: Secondary | ICD-10-CM

## 2020-02-10 ENCOUNTER — Other Ambulatory Visit: Payer: Self-pay | Admitting: *Deleted

## 2020-02-22 ENCOUNTER — Encounter: Payer: Self-pay | Admitting: Obstetrics and Gynecology

## 2020-02-22 ENCOUNTER — Other Ambulatory Visit: Payer: Self-pay

## 2020-02-22 ENCOUNTER — Ambulatory Visit (INDEPENDENT_AMBULATORY_CARE_PROVIDER_SITE_OTHER): Payer: Medicaid Other | Admitting: Obstetrics and Gynecology

## 2020-02-22 VITALS — BP 123/71 | HR 113 | Wt 217.6 lb

## 2020-02-22 DIAGNOSIS — Z3A33 33 weeks gestation of pregnancy: Secondary | ICD-10-CM

## 2020-02-22 DIAGNOSIS — Z23 Encounter for immunization: Secondary | ICD-10-CM | POA: Diagnosis not present

## 2020-02-22 DIAGNOSIS — O99891 Other specified diseases and conditions complicating pregnancy: Secondary | ICD-10-CM

## 2020-02-22 DIAGNOSIS — M549 Dorsalgia, unspecified: Secondary | ICD-10-CM

## 2020-02-22 DIAGNOSIS — Z3403 Encounter for supervision of normal first pregnancy, third trimester: Secondary | ICD-10-CM

## 2020-02-22 NOTE — Patient Instructions (Signed)

## 2020-02-22 NOTE — Progress Notes (Signed)
   LOW-RISK PREGNANCY OFFICE VISIT Patient name: Nicole Schaefer MRN [id number]  Date of birth: 08-04-2000 Chief Complaint:   Routine Prenatal Visit  History of Present Illness:   Nicole Schaefer is a 19 y.o. G1P0 female at [redacted]w[redacted]d with an Estimated Date of Delivery: 04/10/20 being seen today for ongoing management of a low-risk pregnancy.  Today she reports backache. Contractions: Irritability. Vag. Bleeding: None.  Movement: Present. denies leaking of fluid. Review of Systems:   Pertinent items are noted in HPI Denies abnormal vaginal discharge w/ itching/odor/irritation, headaches, visual changes, shortness of breath, chest pain, abdominal pain, severe nausea/vomiting, or problems with urination or bowel movements unless otherwise stated above. Pertinent History Reviewed:  Reviewed past medical,surgical, social, obstetrical and family history.  Reviewed problem list, medications and allergies. Physical Assessment:   Vitals:   02/22/20 1545  BP: 123/71  Pulse: (!) 113  Weight: 217 lb 9.6 oz (98.7 kg)  Body mass index is 38.55 kg/m.        Physical Examination:   General appearance: Well appearing, and in no distress  Mental status: Alert, oriented to person, place, and time  Skin: Warm & dry  Cardiovascular: Normal heart rate noted  Respiratory: Normal respiratory effort, no distress  Abdomen: Soft, gravid, nontender  Pelvic: Cervical exam deferred         Extremities: Edema: None  Fetal Status: Fetal Heart Rate (bpm): 144   Movement: Present    No results found for this or any previous visit (from the past 24 hour(s)).  Assessment & Plan:  1) Low-risk pregnancy G1P0 at [redacted]w[redacted]d with an Estimated Date of Delivery: 04/10/20   2) Supervision of normal first teen pregnancy in third trimester - Discussed GBS and cervical check nv  3) [redacted] weeks gestation of pregnancy  4) Back pain affecting pregnancy in third trimester - Not wearing maternity belt   Meds: No orders of the defined  types were placed in this encounter.  Labs/procedures today: none  Plan:  Continue routine obstetrical care   Reviewed: Preterm labor symptoms and general obstetric precautions including but not limited to vaginal bleeding, contractions, leaking of fluid and fetal movement were reviewed in detail with the patient.  All questions were answered. Has home bp cuff. Check bp weekly, let us know if >140/90.   Follow-up: Return in about 3 weeks (around 03/14/2020) for Return OB w/GBS.  Orders Placed This Encounter  Procedures  . Flu Vaccine QUAD 36+ mos IM (Fluarix, Quad PF)   Raelyn Mora MSN, CNM 02/22/2020 4:16 PM

## 2020-02-22 NOTE — Progress Notes (Signed)
Pt presents for ROB c/o low back pain.   Flu vaccine given in RD without difficulty

## 2020-02-25 ENCOUNTER — Telehealth: Payer: Medicaid Other | Admitting: Physician Assistant

## 2020-02-26 ENCOUNTER — Encounter (HOSPITAL_COMMUNITY): Payer: Self-pay | Admitting: Obstetrics & Gynecology

## 2020-02-26 ENCOUNTER — Inpatient Hospital Stay (HOSPITAL_COMMUNITY)
Admission: AD | Admit: 2020-02-26 | Discharge: 2020-02-27 | Disposition: A | Discharge status: Home / Self Care | Payer: Medicaid Other | Attending: Obstetrics & Gynecology | Admitting: Obstetrics & Gynecology

## 2020-02-26 ENCOUNTER — Other Ambulatory Visit: Payer: Self-pay

## 2020-02-26 DIAGNOSIS — R1030 Lower abdominal pain, unspecified: Secondary | ICD-10-CM | POA: Diagnosis not present

## 2020-02-26 DIAGNOSIS — J45909 Unspecified asthma, uncomplicated: Secondary | ICD-10-CM | POA: Diagnosis not present

## 2020-02-26 DIAGNOSIS — O23593 Infection of other part of genital tract in pregnancy, third trimester: Secondary | ICD-10-CM | POA: Insufficient documentation

## 2020-02-26 DIAGNOSIS — B9689 Other specified bacterial agents as the cause of diseases classified elsewhere: Secondary | ICD-10-CM | POA: Diagnosis not present

## 2020-02-26 DIAGNOSIS — O26899 Other specified pregnancy related conditions, unspecified trimester: Secondary | ICD-10-CM

## 2020-02-26 DIAGNOSIS — Z3689 Encounter for other specified antenatal screening: Secondary | ICD-10-CM

## 2020-02-26 DIAGNOSIS — Z7982 Long term (current) use of aspirin: Secondary | ICD-10-CM | POA: Insufficient documentation

## 2020-02-26 DIAGNOSIS — O26893 Other specified pregnancy related conditions, third trimester: Secondary | ICD-10-CM | POA: Diagnosis not present

## 2020-02-26 DIAGNOSIS — Z7951 Long term (current) use of inhaled steroids: Secondary | ICD-10-CM | POA: Diagnosis not present

## 2020-02-26 DIAGNOSIS — Z3A33 33 weeks gestation of pregnancy: Secondary | ICD-10-CM

## 2020-02-26 DIAGNOSIS — R109 Unspecified abdominal pain: Secondary | ICD-10-CM | POA: Diagnosis present

## 2020-02-26 DIAGNOSIS — O99513 Diseases of the respiratory system complicating pregnancy, third trimester: Secondary | ICD-10-CM | POA: Insufficient documentation

## 2020-02-26 DIAGNOSIS — N760 Acute vaginitis: Secondary | ICD-10-CM

## 2020-02-26 LAB — URINALYSIS, ROUTINE W REFLEX MICROSCOPIC
Bilirubin Urine: NEGATIVE
Glucose, UA: NEGATIVE mg/dL
Hgb urine dipstick: NEGATIVE
Ketones, ur: NEGATIVE mg/dL
Leukocytes,Ua: NEGATIVE
Nitrite: NEGATIVE
Protein, ur: NEGATIVE mg/dL
Specific Gravity, Urine: 1.02 (ref 1.005–1.030)
pH: 6 (ref 5.0–8.0)

## 2020-02-26 LAB — WET PREP, GENITAL
Sperm: NONE SEEN
Trich, Wet Prep: NONE SEEN
WBC, Wet Prep HPF POC: NONE SEEN
Yeast Wet Prep HPF POC: NONE SEEN

## 2020-02-26 MED ORDER — CYCLOBENZAPRINE HCL 5 MG PO TABS
10.0000 mg | ORAL_TABLET | Freq: Once | ORAL | Status: AC
  Administered 2020-02-26: 10 mg via ORAL
  Filled 2020-02-26: qty 2

## 2020-02-26 MED ORDER — METRONIDAZOLE 0.75 % VA GEL: 1.0000 | Freq: Every day | VAGINAL | 0 refills | Status: DC

## 2020-02-26 NOTE — MAU Note (Signed)
Pt reporting constant cramping in lower/mid abdomen since around 1400 today.  Also has been pelvic pressure for past week.  Denies LOF or vaginal bleeding.  Reports good fetal movement.  Took 1000mg  of Tylenol around 1415 and it didn't help at all.

## 2020-02-26 NOTE — MAU Provider Note (Signed)
History     CSN: [id number]  Arrival date and time: 02/26/20 2202   First Provider Initiated Contact with Patient 02/26/20 2300      Chief Complaint  Patient presents with  . Abdominal Pain   Nicole Schaefer is a 19 y.o. G1P0 at 82w5dwho receives care at [facility].  She presents today for Abdominal Pain.  She states she started having "a real sharp cramp" around 2pm that is constant.  She states the cramping started after sitting down and has remained.  She states she tried tylenol without relief of her symptoms.  She states the pain is intensified with sitting and walking, but has not improved with any known factors. She rates the pain a 9/10. Patient denies vaginal discharge, bleeding, or leaking. She endorses fetal movement and denies contractions. She denies sexual activity in the past 72 hours as well issues with urination, constipation, or diarrhea.    OB History    Gravida  1   Para      Term      Preterm      AB      Living        SAB      TAB      Ectopic      Multiple  0   Live Births              Past Medical History:  Diagnosis Date  . Asthma   . Varicose vein of leg     Past Surgical History:  Procedure Laterality Date  . LEG SURGERY Left     Family History  Problem Relation Age of Onset  . Healthy Mother   . Healthy Father   . Hypertension Father   . Hyperlipidemia Father   . Sleep apnea Father   . Diabetes Paternal Grandmother   . Heart attack Paternal Grandmother     Social History   Tobacco Use  . Smoking status: Never Smoker  . Smokeless tobacco: Never Used  Vaping Use  . Vaping Use: Never used  Substance Use Topics  . Alcohol use: No  . Drug use: No    Allergies:  Allergies  Allergen Reactions  . Pollen Extract     Runny nose    Medications Prior to Admission  Medication Sig Dispense Refill Last Dose  . albuterol (PROVENTIL) (2.5 MG/3ML) 0.083% nebulizer solution Take 3 mLs (2.5 mg total) by nebulization  every 4 (four) hours as needed for wheezing or shortness of breath. 75 mL 3 Past Week at Unknown time  . albuterol (VENTOLIN HFA) 108 (90 Base) MCG/ACT inhaler Inhale into the lungs every 6 (six) hours as needed.   Past Week at Unknown time  . aspirin EC 81 MG tablet Take 1 tablet (81 mg total) by mouth daily. 60 tablet 2 02/26/2020 at Unknown time  . Cholecalciferol (VITAMIN D3 PO) Take by mouth.   02/26/2020 at Unknown time  . cyclobenzaprine (FLEXERIL) 10 MG tablet Take 1 tablet (10 mg total) by mouth 3 (three) times daily as needed. 30 tablet 0 Past Month at Unknown time  . fluticasone (FLOVENT HFA) 44 MCG/ACT inhaler Inhale into the lungs 2 (two) times daily.   Past Month at Unknown time  . loratadine (CLARITIN) 10 MG tablet Take 10 mg by mouth daily.   02/26/2020 at Unknown time  . Mometasone Furo-Formoterol Fum 50-5 MCG/ACT AERO Inhale into the lungs.   Past Month at Unknown time  . Prenatal Vit-Fe Fumarate-FA (PRENATAL MULTIVITAMIN) TABS  tablet Take 1 tablet by mouth daily at 12 noon.   02/26/2020 at Unknown time  . Blood Pressure Monitor KIT 1 Device by Does not apply route once a week. To be monitored Regularly at home. 1 kit 0   . butalbital-acetaminophen-caffeine (FIORICET) 50-325-40 MG tablet Take 1-2 tablets by mouth every 6 (six) hours as needed for headache. 20 tablet 0   . Elastic Bandages & Supports (COMFORT FIT MATERNITY SUPP LG) MISC 1 Units by Does not apply route daily. 1 each 0   . hyoscyamine (LEVSIN) 0.125 MG tablet Take 0.125 mg by mouth every 4 (four) hours as needed.     Marland Kitchen ipratropium (ATROVENT) 0.03 % nasal spray Place 2 sprays into both nostrils every 12 (twelve) hours.     . Nebulizer MISC Diagnosis: Wheezing R06.2 Medically necessary Dispense: 1 neb machine for use with albuterol nebs 1 each 0 More than a month at Unknown time    Review of Systems  Constitutional: Negative for chills and fever.  Respiratory: Negative for cough and shortness of breath.    Gastrointestinal: Positive for abdominal pain. Negative for constipation, diarrhea, nausea and vomiting.  Genitourinary: Negative for difficulty urinating, dysuria, pelvic pain, vaginal bleeding and vaginal discharge.  Musculoskeletal: Positive for back pain (Intermittent).  Neurological: Negative for dizziness, light-headedness and headaches.   Physical Exam   Blood pressure 129/70, pulse (!) 112, temperature 98 F (36.7 C), temperature source Oral, resp. rate 14, last menstrual period 06/11/2019, SpO2 96 %, unknown if currently breastfeeding.  Physical Exam Vitals reviewed.  Constitutional:      Appearance: Normal appearance. She is well-developed.  HENT:     Head: Normocephalic and atraumatic.  Eyes:     Conjunctiva/sclera: Conjunctivae normal.  Cardiovascular:     Rate and Rhythm: Normal rate and regular rhythm.  Pulmonary:     Effort: Pulmonary effort is normal. No respiratory distress.  Abdominal:     Tenderness: There is abdominal tenderness in the right lower quadrant, suprapubic area and left lower quadrant.     Comments: Gravid  Genitourinary:    Cervix: No cervical motion tenderness.     Comments: GC/CT and Wet Prep collected blindly NEFG: Moderate amt milky white discharge noted at introitus.  Cervix closed/soft/long Musculoskeletal:        General: Normal range of motion.  Skin:    General: Skin is warm and dry.  Neurological:     Mental Status: She is alert and oriented to person, place, and time.  Psychiatric:        Mood and Affect: Mood normal.        Behavior: Behavior normal.        Thought Content: Thought content normal.     Fetal Assessment 145 bpm, Mod Var, -Decels, +15x15Accels Toco: No ctx graphed  MAU Course   Results for orders placed or performed during the hospital encounter of 02/26/20 (from the past 24 hour(s))  Urinalysis, Routine w reflex microscopic Urine, Clean Catch     Status: None   Collection Time: 02/26/20 10:47 PM  Result  Value Ref Range   Color, Urine YELLOW YELLOW   APPearance CLEAR CLEAR   Specific Gravity, Urine 1.020 1.005 - 1.030   pH 6.0 5.0 - 8.0   Glucose, UA NEGATIVE NEGATIVE mg/dL   Hgb urine dipstick NEGATIVE NEGATIVE   Bilirubin Urine NEGATIVE NEGATIVE   Ketones, ur NEGATIVE NEGATIVE mg/dL   Protein, ur NEGATIVE NEGATIVE mg/dL   Nitrite NEGATIVE NEGATIVE   Leukocytes,Ua NEGATIVE  NEGATIVE  Wet prep, genital     Status: Abnormal   Collection Time: 02/26/20 11:13 PM   Specimen: PATH Cytology Cervicovaginal Ancillary Only  Result Value Ref Range   Yeast Wet Prep HPF POC NONE SEEN NONE SEEN   Trich, Wet Prep NONE SEEN NONE SEEN   Clue Cells Wet Prep HPF POC PRESENT (A) NONE SEEN   WBC, Wet Prep HPF POC NONE SEEN NONE SEEN   Sperm NONE SEEN    No results found.  MDM PE Labs: UA, Wet prep, GC/CT  EFM Pain Medication  Assessment and Plan  19 year old G1P0  SIUP at 33.5weeks Cat I FT Abdominal Pain  -POC reviewed. -Patient refuses/declines speculum usage. -Informed that swabs can and will be collected blindly. -Patient agreeable -Cultures collected and pending.  -Will give flexeril for abdominal cramp -Will await results.    Maryann Conners MSN, CNM 02/26/2020, 11:00 PM   Reassessment (11:40 PM)  -Wet prep returns positive for clue cells. -Provider to bedside to discuss results and diagnosis of bacterial vaginosis. -Patient educated on what it is and how we treat. -Will send in prescription for Metrogel. -Patient informed that BV is not the same as GBS and she should keep next scheduled appt. -Patient without further questions or concerns. -Patient reports improvement in cramping with flexeril dosing.  -NST reactive. -Encouraged to call or return to MAU if symptoms worsen or with the onset of new symptoms. -Discharged to home in improved condition.  Maryann Conners MSN, CNM Advanced Practice Provider, [facility]

## 2020-02-26 NOTE — Discharge Instructions (Signed)
Bacterial Vaginosis  Bacterial vaginosis is a vaginal infection that occurs when the normal balance of bacteria in the vagina is disrupted. It results from an overgrowth of certain bacteria. This is the most common vaginal infection among women ages 15-44. Because bacterial vaginosis increases your risk for STIs (sexually transmitted infections), getting treated can help reduce your risk for chlamydia, gonorrhea, herpes, and HIV (human immunodeficiency virus). Treatment is also important for preventing complications in pregnant women, because this condition can cause an early (premature) delivery. What are the causes? This condition is caused by an increase in harmful bacteria that are normally present in small amounts in the vagina. However, the reason that the condition develops is not fully understood. What increases the risk? The following factors may make you more likely to develop this condition:  Having a new sexual partner or multiple sexual partners.  Having unprotected sex.  Douching.  Having an intrauterine device (IUD).  Smoking.  Drug and alcohol abuse.  Taking certain antibiotic medicines.  Being pregnant. You cannot get bacterial vaginosis from toilet seats, bedding, swimming pools, or contact with objects around you. What are the signs or symptoms? Symptoms of this condition include:  Grey or white vaginal discharge. The discharge can also be watery or foamy.  A fish-like odor with discharge, especially after sexual intercourse or during menstruation.  Itching in and around the vagina.  Burning or pain with urination. Some women with bacterial vaginosis have no signs or symptoms. How is this diagnosed? This condition is diagnosed based on:  Your medical history.  A physical exam of the vagina.  Testing a sample of vaginal fluid under a microscope to look for a large amount of bad bacteria or abnormal cells. Your health care provider may use a cotton swab or  a small wooden spatula to collect the sample. How is this treated? This condition is treated with antibiotics. These may be given as a pill, a vaginal cream, or a medicine that is put into the vagina (suppository). If the condition comes back after treatment, a second round of antibiotics may be needed. Follow these instructions at home: Medicines  Take over-the-counter and prescription medicines only as told by your health care provider.  Take or use your antibiotic as told by your health care provider. Do not stop taking or using the antibiotic even if you start to feel better. General instructions  If you have a female sexual partner, tell her that you have a vaginal infection. She should see her health care provider and be treated if she has symptoms. If you have a female sexual partner, he does not need treatment.  During treatment: ? Avoid sexual activity until you finish treatment. ? Do not douche. ? Avoid alcohol as directed by your health care provider. ? Avoid breastfeeding as directed by your health care provider.  Drink enough water and fluids to keep your urine clear or pale yellow.  Keep the area around your vagina and rectum clean. ? Wash the area daily with warm water. ? Wipe yourself from front to back after using the toilet.  Keep all follow-up visits as told by your health care provider. This is important. How is this prevented?  Do not douche.  Wash the outside of your vagina with warm water only.  Use protection when having sex. This includes latex condoms and dental dams.  Limit how many sexual partners you have. To help prevent bacterial vaginosis, it is best to have sex with just one partner (  monogamous).  Make sure you and your sexual partner are tested for STIs.  Wear cotton or cotton-lined underwear.  Avoid wearing tight pants and pantyhose, especially during summer.  Limit the amount of alcohol that you drink.  Do not use any products that contain  nicotine or tobacco, such as cigarettes and e-cigarettes. If you need help quitting, ask your health care provider.  Do not use illegal drugs. Where to find more information  Centers for Disease Control and Prevention: www.cdc.gov/std  American Sexual Health Association (ASHA): www.ashastd.org  U.S. Department of Health and Human Services, Office on Women's Health: www.womenshealth.gov/ or https://www.womenshealth.gov/a-z-topics/bacterial-vaginosis Contact a health care provider if:  Your symptoms do not improve, even after treatment.  You have more discharge or pain when urinating.  You have a fever.  You have pain in your abdomen.  You have pain during sex.  You have vaginal bleeding between periods. Summary  Bacterial vaginosis is a vaginal infection that occurs when the normal balance of bacteria in the vagina is disrupted.  Because bacterial vaginosis increases your risk for STIs (sexually transmitted infections), getting treated can help reduce your risk for chlamydia, gonorrhea, herpes, and HIV (human immunodeficiency virus). Treatment is also important for preventing complications in pregnant women, because the condition can cause an early (premature) delivery.  This condition is treated with antibiotic medicines. These may be given as a pill, a vaginal cream, or a medicine that is put into the vagina (suppository). This information is not intended to replace advice given to you by your health care provider. Make sure you discuss any questions you have with your health care provider. Document Revised: 05/16/2017 Document Reviewed: 02/17/2016 Elsevier Patient Education  2020 Elsevier Inc.  

## 2020-02-28 LAB — GC/CHLAMYDIA PROBE AMP (~~LOC~~) NOT AT ARMC
Chlamydia: NEGATIVE
Comment: NEGATIVE
Comment: NORMAL
Neisseria Gonorrhea: NEGATIVE

## 2020-03-07 ENCOUNTER — Encounter (HOSPITAL_COMMUNITY): Payer: Self-pay | Admitting: Family Medicine

## 2020-03-07 ENCOUNTER — Ambulatory Visit: Payer: Medicaid Other | Admitting: *Deleted

## 2020-03-07 ENCOUNTER — Inpatient Hospital Stay (HOSPITAL_COMMUNITY)
Admission: AD | Admit: 2020-03-07 | Discharge: 2020-03-08 | Disposition: A | Discharge status: Home / Self Care | Payer: Medicaid Other | Attending: Family Medicine | Admitting: Family Medicine

## 2020-03-07 ENCOUNTER — Encounter: Payer: Self-pay | Admitting: *Deleted

## 2020-03-07 ENCOUNTER — Other Ambulatory Visit: Payer: Self-pay

## 2020-03-07 ENCOUNTER — Ambulatory Visit (HOSPITAL_BASED_OUTPATIENT_CLINIC_OR_DEPARTMENT_OTHER): Payer: Medicaid Other

## 2020-03-07 VITALS — BP 109/66 | HR 97

## 2020-03-07 DIAGNOSIS — O99891 Other specified diseases and conditions complicating pregnancy: Secondary | ICD-10-CM | POA: Diagnosis not present

## 2020-03-07 DIAGNOSIS — Z6841 Body Mass Index (BMI) 40.0 and over, adult: Secondary | ICD-10-CM

## 2020-03-07 DIAGNOSIS — Z3A35 35 weeks gestation of pregnancy: Secondary | ICD-10-CM | POA: Diagnosis not present

## 2020-03-07 DIAGNOSIS — J45909 Unspecified asthma, uncomplicated: Secondary | ICD-10-CM | POA: Insufficient documentation

## 2020-03-07 DIAGNOSIS — O99213 Obesity complicating pregnancy, third trimester: Secondary | ICD-10-CM | POA: Insufficient documentation

## 2020-03-07 DIAGNOSIS — O9A213 Injury, poisoning and certain other consequences of external causes complicating pregnancy, third trimester: Secondary | ICD-10-CM | POA: Insufficient documentation

## 2020-03-07 DIAGNOSIS — Z7982 Long term (current) use of aspirin: Secondary | ICD-10-CM | POA: Diagnosis not present

## 2020-03-07 DIAGNOSIS — S8992XA Unspecified injury of left lower leg, initial encounter: Secondary | ICD-10-CM | POA: Diagnosis present

## 2020-03-07 DIAGNOSIS — Z6832 Body mass index (BMI) 32.0-32.9, adult: Secondary | ICD-10-CM | POA: Diagnosis not present

## 2020-03-07 DIAGNOSIS — Z79899 Other long term (current) drug therapy: Secondary | ICD-10-CM | POA: Diagnosis not present

## 2020-03-07 DIAGNOSIS — O99513 Diseases of the respiratory system complicating pregnancy, third trimester: Secondary | ICD-10-CM | POA: Insufficient documentation

## 2020-03-07 DIAGNOSIS — W19XXXA Unspecified fall, initial encounter: Secondary | ICD-10-CM

## 2020-03-07 DIAGNOSIS — S80912A Unspecified superficial injury of left knee, initial encounter: Secondary | ICD-10-CM | POA: Diagnosis not present

## 2020-03-07 LAB — URINALYSIS, ROUTINE W REFLEX MICROSCOPIC
Bilirubin Urine: NEGATIVE
Glucose, UA: NEGATIVE mg/dL
Hgb urine dipstick: NEGATIVE
Ketones, ur: NEGATIVE mg/dL
Leukocytes,Ua: NEGATIVE
Nitrite: NEGATIVE
Protein, ur: NEGATIVE mg/dL
Specific Gravity, Urine: 1.012 (ref 1.005–1.030)
pH: 5 (ref 5.0–8.0)
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
## 2020-03-12 ENCOUNTER — Encounter (HOSPITAL_COMMUNITY): Payer: Self-pay | Admitting: Family Medicine

## 2020-03-12 ENCOUNTER — Other Ambulatory Visit: Payer: Self-pay

## 2020-03-12 ENCOUNTER — Inpatient Hospital Stay (HOSPITAL_COMMUNITY)
Admission: AD | Admit: 2020-03-12 | Discharge: 2020-03-12 | Disposition: A | Discharge status: Home / Self Care | Payer: Medicaid Other | Attending: Family Medicine | Admitting: Family Medicine

## 2020-03-12 DIAGNOSIS — Z7982 Long term (current) use of aspirin: Secondary | ICD-10-CM | POA: Diagnosis not present

## 2020-03-12 DIAGNOSIS — O99613 Diseases of the digestive system complicating pregnancy, third trimester: Secondary | ICD-10-CM | POA: Diagnosis not present

## 2020-03-12 DIAGNOSIS — J45909 Unspecified asthma, uncomplicated: Secondary | ICD-10-CM | POA: Insufficient documentation

## 2020-03-12 DIAGNOSIS — Z7951 Long term (current) use of inhaled steroids: Secondary | ICD-10-CM | POA: Insufficient documentation

## 2020-03-12 DIAGNOSIS — O26893 Other specified pregnancy related conditions, third trimester: Secondary | ICD-10-CM

## 2020-03-12 DIAGNOSIS — R197 Diarrhea, unspecified: Secondary | ICD-10-CM | POA: Diagnosis not present

## 2020-03-12 DIAGNOSIS — Z3A35 35 weeks gestation of pregnancy: Secondary | ICD-10-CM | POA: Diagnosis not present

## 2020-03-12 DIAGNOSIS — O26899 Other specified pregnancy related conditions, unspecified trimester: Secondary | ICD-10-CM

## 2020-03-12 DIAGNOSIS — O99513 Diseases of the respiratory system complicating pregnancy, third trimester: Secondary | ICD-10-CM | POA: Diagnosis not present

## 2020-03-12 DIAGNOSIS — Z79899 Other long term (current) drug therapy: Secondary | ICD-10-CM | POA: Diagnosis not present

## 2020-03-12 LAB — URINALYSIS, ROUTINE W REFLEX MICROSCOPIC
Bilirubin Urine: NEGATIVE
Glucose, UA: NEGATIVE mg/dL
Hgb urine dipstick: NEGATIVE
Ketones, ur: NEGATIVE mg/dL
Leukocytes,Ua: NEGATIVE
Nitrite: NEGATIVE
Protein, ur: 30 mg/dL — AB
Specific Gravity, Urine: 1.018 (ref 1.005–1.030)
pH: 5 (ref 5.0–8.0)

## 2020-03-12 MED ORDER — LOPERAMIDE HCL 2 MG PO CAPS
4.0000 mg | ORAL_CAPSULE | Freq: Once | ORAL | Status: AC
  Administered 2020-03-12: 4 mg via ORAL
  Filled 2020-03-12: qty 2

## 2020-03-12 NOTE — MAU Provider Note (Signed)
History     CSN: [id number]  Arrival date and time: 03/12/20 2409   First Provider Initiated Contact with Patient 03/12/20 2050      Chief Complaint  Patient presents with  . Contractions  . Diarrhea   HPI Nicole Schaefer is a 19 y.o. G1P0 at 5w6dwho presents with lower abdominal cramping. She states for the last 2 days she has had more than 5 diarrhea stools a day. She denies any sick contacts or eating anything new. She states today she noticed she was having intermittent abdominal pain. She is unsure how often she is feeling it and unsure if it happens when she has the diarrhea. She denies any nausea or vomiting. She denies vaginal bleeding or leaking of fluid. She reports normal fetal movement.   OB History    Gravida  1   Para      Term      Preterm      AB      Living        SAB      TAB      Ectopic      Multiple  0   Live Births              Past Medical History:  Diagnosis Date  . Asthma    has been a while since inhaler/neb use   . Varicose vein of leg     Past Surgical History:  Procedure Laterality Date  . LEG SURGERY Left     Family History  Problem Relation Age of Onset  . Healthy Mother   . Healthy Father   . Hypertension Father   . Hyperlipidemia Father   . Sleep apnea Father   . Diabetes Paternal Grandmother   . Heart attack Paternal Grandmother     Social History   Tobacco Use  . Smoking status: Never Smoker  . Smokeless tobacco: Never Used  Vaping Use  . Vaping Use: Never used  Substance Use Topics  . Alcohol use: No  . Drug use: No    Allergies:  Allergies  Allergen Reactions  . Pollen Extract     Runny nose    Medications Prior to Admission  Medication Sig Dispense Refill Last Dose  . albuterol (VENTOLIN HFA) 108 (90 Base) MCG/ACT inhaler Inhale into the lungs every 6 (six) hours as needed.     .Marland Kitchenaspirin EC 81 MG tablet Take 1 tablet (81 mg total) by mouth daily. 60 tablet 2   . Blood Pressure Monitor  KIT 1 Device by Does not apply route once a week. To be monitored Regularly at home. 1 kit 0   . Cholecalciferol (VITAMIN D3 PO) Take by mouth.     . cyclobenzaprine (FLEXERIL) 10 MG tablet Take 1 tablet (10 mg total) by mouth 3 (three) times daily as needed. 30 tablet 0   . Elastic Bandages & Supports (COMFORT FIT MATERNITY SUPP LG) MISC 1 Units by Does not apply route daily. 1 each 0   . fluticasone (FLOVENT HFA) 44 MCG/ACT inhaler Inhale into the lungs 2 (two) times daily.     .Marland Kitchenloratadine (CLARITIN) 10 MG tablet Take 10 mg by mouth daily.     . Mometasone Furo-Formoterol Fum 50-5 MCG/ACT AERO Inhale into the lungs.     . Nebulizer MISC Diagnosis: Wheezing R06.2 Medically necessary Dispense: 1 neb machine for use with albuterol nebs 1 each 0   . Prenatal Vit-Fe Fumarate-FA (PRENATAL MULTIVITAMIN) TABS tablet Take 1  tablet by mouth daily at 12 noon.       Review of Systems  Constitutional: Negative.  Negative for fatigue and fever.  HENT: Negative.   Respiratory: Negative.  Negative for shortness of breath.   Cardiovascular: Negative.  Negative for chest pain.  Gastrointestinal: Positive for abdominal pain and diarrhea. Negative for constipation, nausea and vomiting.  Genitourinary: Negative.  Negative for dysuria, vaginal bleeding and vaginal discharge.  Neurological: Negative.  Negative for dizziness and headaches.   Physical Exam   Blood pressure 132/76, temperature 98.8 F (37.1 C), temperature source Oral, resp. rate 17, height 5' 3"  (1.6 m), weight 102 kg, last menstrual period 06/11/2019, SpO2 97 %, unknown if currently breastfeeding.  Physical Exam Vitals and nursing note reviewed.  Constitutional:      General: She is not in acute distress.    Appearance: She is well-developed.  HENT:     Head: Normocephalic.  Eyes:     Pupils: Pupils are equal, round, and reactive to light.  Cardiovascular:     Rate and Rhythm: Normal rate and regular rhythm.     Heart sounds: Normal  heart sounds.  Pulmonary:     Effort: Pulmonary effort is normal. No respiratory distress.     Breath sounds: Normal breath sounds.  Abdominal:     General: Bowel sounds are normal. There is no distension.     Palpations: Abdomen is soft.     Tenderness: There is no abdominal tenderness.  Skin:    General: Skin is warm and dry.  Neurological:     Mental Status: She is alert and oriented to person, place, and time.  Psychiatric:        Behavior: Behavior normal.        Thought Content: Thought content normal.        Judgment: Judgment normal.    Fetal Tracing:  Baseline: 135 Variability: moderate Accels: 15x15 Decels: none  Toco: ui  Dilation: Closed Effacement (%): Thick Exam by:: Haynes Bast, CNM  MAU Course  Procedures Results for orders placed or performed during the hospital encounter of 03/12/20 (from the past 24 hour(s))  Urinalysis, Routine w reflex microscopic Urine, Clean Catch     Status: Abnormal   Collection Time: 03/12/20  8:36 PM  Result Value Ref Range   Color, Urine YELLOW YELLOW   APPearance HAZY (A) CLEAR   Specific Gravity, Urine 1.018 1.005 - 1.030   pH 5.0 5.0 - 8.0   Glucose, UA NEGATIVE NEGATIVE mg/dL   Hgb urine dipstick NEGATIVE NEGATIVE   Bilirubin Urine NEGATIVE NEGATIVE   Ketones, ur NEGATIVE NEGATIVE mg/dL   Protein, ur 30 (A) NEGATIVE mg/dL   Nitrite NEGATIVE NEGATIVE   Leukocytes,Ua NEGATIVE NEGATIVE   RBC / HPF 0-5 0 - 5 RBC/hpf   WBC, UA 0-5 0 - 5 WBC/hpf   Bacteria, UA RARE (A) NONE SEEN   Squamous Epithelial / LPF 0-5 0 - 5   Mucus PRESENT    MDM UA Imodium  No episodes of diarrhea in MAU Occasional uc's but no cervical change, low suspicion for preterm labor at this time.   Assessment and Plan   1. Diarrhea during pregnancy   2. [redacted] weeks gestation of pregnancy    -Discharge home in stable condition -Preterm labor precautions discussed -Patient advised to follow-up with OB as scheduled for prenatal care -Patient may  return to MAU as needed or if her condition were to change or worsen   Wende Mott CNM  03/12/2020, 8:50 PM

## 2020-03-12 NOTE — Discharge Instructions (Signed)
Food Choices to Help Relieve Diarrhea, Adult When you have diarrhea, the foods you eat and your eating habits are very important. Choosing the right foods and drinks can help:  Relieve diarrhea.  Replace lost fluids and nutrients.  Prevent dehydration. What general guidelines should I follow?  Relieving diarrhea  Choose foods with less than 2 g or .07 oz. of fiber per serving.  Limit fats to less than 8 tsp (38 g or 1.34 oz.) a day.  Avoid the following: ? Foods and beverages sweetened with high-fructose corn syrup, honey, or sugar alcohols such as xylitol, sorbitol, and mannitol. ? Foods that contain a lot of fat or sugar. ? Fried, greasy, or spicy foods. ? High-fiber grains, breads, and cereals. ? Raw fruits and vegetables.  Eat foods that are rich in probiotics. These foods include dairy products such as yogurt and fermented milk products. They help increase healthy bacteria in the stomach and intestines (gastrointestinal tract, or GI tract).  If you have lactose intolerance, avoid dairy products. These may make your diarrhea worse.  Take medicine to help stop diarrhea (antidiarrheal medicine) only as told by your health care provider. Replacing nutrients  Eat small meals or snacks every 3-4 hours.  Eat bland foods, such as white rice, toast, or baked potato, until your diarrhea starts to get better. Gradually reintroduce nutrient-rich foods as tolerated or as told by your health care provider. This includes: ? Well-cooked protein foods. ? Peeled, seeded, and soft-cooked fruits and vegetables. ? Low-fat dairy products.  Take vitamin and mineral supplements as told by your health care provider. Preventing dehydration  Start by sipping water or a special solution to prevent dehydration (oral rehydration solution, ORS). Urine that is clear or pale yellow means that you are getting enough fluid.  Try to drink at least 8-10 cups of fluid each day to help replace lost  fluids.  You may add other liquids in addition to water, such as clear juice or decaffeinated sports drinks, as tolerated or as told by your health care provider.  Avoid drinks with caffeine, such as coffee, tea, or soft drinks.  Avoid alcohol. What foods are recommended?     The items listed may not be a complete list. Talk with your health care provider about what dietary choices are best for you. Grains White rice. White, French, or pita breads (fresh or toasted), including plain rolls, buns, or bagels. White pasta. Saltine, soda, or graham crackers. Pretzels. Low-fiber cereal. Cooked cereals made with water (such as cornmeal, farina, or cream cereals). Plain muffins. Matzo. Melba toast. Zwieback. Vegetables Potatoes (without the skin). Most well-cooked and canned vegetables without skins or seeds. Tender lettuce. Fruits Apple sauce. Fruits canned in juice. Cooked apricots, cherries, grapefruit, peaches, pears, or plums. Fresh bananas and cantaloupe. Meats and other protein foods Baked or boiled chicken. Eggs. Tofu. Fish. Seafood. Smooth nut butters. Ground or well-cooked tender beef, ham, veal, lamb, pork, or poultry. Dairy Plain yogurt, kefir, and unsweetened liquid yogurt. Lactose-free milk, buttermilk, skim milk, or soy milk. Low-fat or nonfat hard cheese. Beverages Water. Low-calorie sports drinks. Fruit juices without pulp. Strained tomato and vegetable juices. Decaffeinated teas. Sugar-free beverages not sweetened with sugar alcohols. Oral rehydration solutions, if approved by your health care provider. Seasoning and other foods Bouillon, broth, or soups made from recommended foods. What foods are not recommended? The items listed may not be a complete list. Talk with your health care provider about what dietary choices are best for you. Grains Whole   grain, whole wheat, bran, or rye breads, rolls, pastas, and crackers. Wild or brown rice. Whole grain or bran cereals. Barley.  Oats and oatmeal. Corn tortillas or taco shells. Granola. Popcorn. Vegetables Raw vegetables. Fried vegetables. Cabbage, broccoli, Brussels sprouts, artichokes, baked beans, beet greens, corn, kale, legumes, peas, sweet potatoes, and yams. Potato skins. Cooked spinach and cabbage. Fruits Dried fruit, including raisins and dates. Raw fruits. Stewed or dried prunes. Canned fruits with syrup. Meat and other protein foods Fried or fatty meats. Deli meats. Chunky nut butters. Nuts and seeds. Beans and lentils. Tomasa Blase. Hot dogs. Sausage. Dairy High-fat cheeses. Whole milk, chocolate milk, and beverages made with milk, such as milk shakes. Half-and-half. Cream. sour cream. Ice cream. Beverages Caffeinated beverages (such as coffee, tea, soda, or energy drinks). Alcoholic beverages. Fruit juices with pulp. Prune juice. Soft drinks sweetened with high-fructose corn syrup or sugar alcohols. High-calorie sports drinks. Fats and oils Butter. Cream sauces. Margarine. Salad oils. Plain salad dressings. Olives. Avocados. Mayonnaise. Sweets and desserts Sweet rolls, doughnuts, and sweet breads. Sugar-free desserts sweetened with sugar alcohols such as xylitol and sorbitol. Seasoning and other foods Honey. Hot sauce. Chili powder. Gravy. Cream-based or milk-based soups. Pancakes and waffles. Summary  When you have diarrhea, the foods you eat and your eating habits are very important.  Make sure you get at least 8-10 cups of fluid each day, or enough to keep your urine clear or pale yellow.  Eat bland foods and gradually reintroduce healthy, nutrient-rich foods as tolerated, or as told by your health care provider.  Avoid high-fiber, fried, greasy, or spicy foods. This information is not intended to replace advice given to you by your health care provider. Make sure you discuss any questions you have with your health care provider. Document Revised: 09/24/2018 Document Reviewed: 05/31/2016 Elsevier Patient  Education  2020 [employer].  Safe Medications in Pregnancy   Acne: Benzoyl Peroxide Salicylic Acid  Backache/Headache: Tylenol: 2 regular strength every 4 hours OR              2 Extra strength every 6 hours  Colds/Coughs/Allergies: Benadryl (alcohol free) 25 mg every 6 hours as needed Breath right strips Claritin Cepacol throat lozenges Chloraseptic throat spray Cold-Eeze- up to three times per day Cough drops, alcohol free Flonase (by prescription only) Guaifenesin Mucinex Robitussin DM (plain only, alcohol free) Saline nasal spray/drops Sudafed (pseudoephedrine) & Actifed ** use only after [redacted] weeks gestation and if you do not have high blood pressure Tylenol Vicks Vaporub Zinc lozenges Zyrtec   Constipation: Colace Ducolax suppositories Fleet enema Glycerin suppositories Metamucil Milk of magnesia Miralax Senokot Smooth move tea  Diarrhea: Kaopectate Imodium A-D  *NO pepto Bismol  Hemorrhoids: Anusol Anusol HC Preparation H Tucks  Indigestion: Tums Maalox Mylanta Zantac  Pepcid  Insomnia: Benadryl (alcohol free) 25mg  every 6 hours as needed Tylenol PM Unisom, no Gelcaps  Leg Cramps: Tums MagGel  Nausea/Vomiting:  Bonine Dramamine Emetrol Ginger extract Sea bands Meclizine  Nausea medication to take during pregnancy:  Unisom (doxylamine succinate 25 mg tablets) Take one tablet daily at bedtime. If symptoms are not adequately controlled, the dose can be increased to a maximum recommended dose of two tablets daily (1/2 tablet in the morning, 1/2 tablet mid-afternoon and one at bedtime). Vitamin B6 100mg  tablets. Take one tablet twice a day (up to 200 mg per day).  Skin Rashes: Aveeno products Benadryl cream or 25mg  every 6 hours as needed Calamine Lotion 1% cortisone cream  Yeast infection: Gyne-lotrimin  7 Monistat 7   **If taking multiple medications, please check labels to avoid duplicating the same active  ingredients **take medication as directed on the label ** Do not exceed 4000 mg of tylenol in 24 hours **Do not take medications that contain aspirin or ibuprofen     

## 2020-03-12 NOTE — MAU Note (Signed)
Pt reports to MAU complaining of abdominal cramping, ctx every 5-6 minutes, and sharp lower back pain. Pt also reports 5 episodes of diarrhea in the past 24 hours.   +FM. Denies VB/LOF.

## 2020-03-14 ENCOUNTER — Other Ambulatory Visit: Payer: Self-pay

## 2020-03-14 ENCOUNTER — Ambulatory Visit (INDEPENDENT_AMBULATORY_CARE_PROVIDER_SITE_OTHER): Payer: Medicaid Other | Admitting: Advanced Practice Midwife

## 2020-03-14 VITALS — BP 122/74 | HR 94 | Wt 230.0 lb

## 2020-03-14 DIAGNOSIS — Z3403 Encounter for supervision of normal first pregnancy, third trimester: Secondary | ICD-10-CM

## 2020-03-14 DIAGNOSIS — O479 False labor, unspecified: Secondary | ICD-10-CM

## 2020-03-14 DIAGNOSIS — R519 Headache, unspecified: Secondary | ICD-10-CM

## 2020-03-14 DIAGNOSIS — O26893 Other specified pregnancy related conditions, third trimester: Secondary | ICD-10-CM

## 2020-03-14 DIAGNOSIS — Z3A36 36 weeks gestation of pregnancy: Secondary | ICD-10-CM

## 2020-03-14 NOTE — Progress Notes (Signed)
   PRENATAL VISIT NOTE  Subjective:  Nicole Schaefer is a 19 y.o. G1P0 at [redacted]w[redacted]d being seen today for ongoing prenatal care.  She is currently monitored for the following issues for this low-risk pregnancy and has Prediabetes; Supervision of normal first teen pregnancy; Obesity in pregnancy; Arteriovenous malformation; Exercise induced bronchospasm; and Perennial allergic rhinitis on their problem list.  Patient reports daily contractions, sometimes irregular, sometimes as close together as every 2 minutes.  Contractions: Irritability. Vag. Bleeding: None.  Movement: Present. Denies leaking of fluid.   The following portions of the patient's history were reviewed and updated as appropriate: allergies, current medications, past family history, past medical history, past social history, past surgical history and problem list.   Objective:   Vitals:   03/14/20 1108  BP: 122/74  Pulse: 94  Weight: 230 lb (104.3 kg)    Fetal Status: Fetal Heart Rate (bpm): 158 Fundal Height: 35 cm Movement: Present  Presentation: Vertex  General:  Alert, oriented and cooperative. Patient is in no acute distress.  Skin: Skin is warm and dry. No rash noted.   Cardiovascular: Normal heart rate noted  Respiratory: Normal respiratory effort, no problems with respiration noted  Abdomen: Soft, gravid, appropriate for gestational age.  Pain/Pressure: Present     Pelvic: Cervical exam deferred Dilation: 2 Effacement (%): 50 Station: -3  Extremities: Normal range of motion.  Edema: Trace  Mental Status: Normal mood and affect. Normal behavior. Normal judgment and thought content.   Assessment and Plan:  Pregnancy: G1P0 at [redacted]w[redacted]d  1. [redacted] weeks gestation of pregnancy --GCC collected on 02/26/20 in MAU and negative - Strep Gp B NAA  2. Supervision of normal first teen pregnancy in third trimester --Anticipatory guidance about next visits/weeks of pregnancy given. --Frequent MAU visits for pain, will follow up in  office weekly --Labor readiness/cervical ripening to start at 37 weeks reviewed  --Next visit in 1 week in office  3. Braxton Hicks contractions --no evidence of labor but cervix 2 cm today, PTL precautions reviewed  4. Headache in pregnancy, antepartum, third trimester --Improved, pt seeing Nada Maclachlan, PA.  Preterm labor symptoms and general obstetric precautions including but not limited to vaginal bleeding, contractions, leaking of fluid and fetal movement were reviewed in detail with the patient. Please refer to After Visit Summary for other counseling recommendations.   Return in about 1 week (around 03/21/2020).  Future Appointments  Date Time Provider Department Center  03/24/2020  8:50 AM Shamleffer, Konrad Dolores, MD [facility] None  04/07/2020 10:15 AM Teague Edwena Blow, PA-C [facility]    Sharen Counter, CNM

## 2020-03-14 NOTE — Progress Notes (Signed)
ROB [redacted]w[redacted]d  GBS today.    GC/CT done on 02/26/20 NEGATIVE  CC: NONE

## 2020-03-14 NOTE — Patient Instructions (Signed)
Things to Try After 37 weeks to Encourage Labor/Get Ready for Labor:   1.  Try the Miles Circuit at www.milescircuit.com daily to improve baby's position and encourage the onset of labor.  2. Walk a little and rest a little every day.  Change positions often.  3. Cervical Ripening: May try one or both a. Red Raspberry Leaf capsules or tea:  two 300mg or 400mg tablets with each meal, 2-3 times a day, or 1-3 cups of tea daily  Potential Side Effects Of Raspberry Leaf:  Most women do not experience any side effects from drinking raspberry leaf tea. However, nausea and loose stools are possible   b. Evening Primrose Oil capsules: may take 1 to 3 capsules daily. Take 1-2 capsules by mouth each day and place one capsule vaginally at night.  You may also prick the vaginal capsule to release the oil prior to inserting in the vagina. Some of the potential side effects:  Upset stomach  Loose stools or diarrhea  Headaches  Nausea  4. Sex (and especially sex with orgasm) can also help the cervix ripen and encourage labor onset.  Labor Precautions Reasons to come to MAU at [facility]:  1.  Contractions are  5 minutes apart or less, each last 1 minute, these have been going on for 1-2 hours, and you cannot walk or talk during them 2.  You have a large gush of fluid, or a trickle of fluid that will not stop and you have to wear a pad 3.  You have bleeding that is bright red, heavier than spotting--like menstrual bleeding (spotting can be normal in early labor or after a check of your cervix) 4.  You do not feel the baby moving like he/she normally does 

## 2020-03-16 LAB — STREP GP B NAA: Strep Gp B NAA: NEGATIVE

## 2020-03-17 ENCOUNTER — Inpatient Hospital Stay (EMERGENCY_DEPARTMENT_HOSPITAL)
Admission: AD | Admit: 2020-03-17 | Discharge: 2020-03-17 | Disposition: A | Discharge status: Home / Self Care | Payer: Medicaid Other | Source: Home / Self Care | Attending: Obstetrics and Gynecology | Admitting: Obstetrics and Gynecology

## 2020-03-17 ENCOUNTER — Other Ambulatory Visit: Payer: Self-pay

## 2020-03-17 ENCOUNTER — Encounter (HOSPITAL_COMMUNITY): Payer: Self-pay | Admitting: Obstetrics and Gynecology

## 2020-03-17 DIAGNOSIS — Z7951 Long term (current) use of inhaled steroids: Secondary | ICD-10-CM | POA: Insufficient documentation

## 2020-03-17 DIAGNOSIS — O99513 Diseases of the respiratory system complicating pregnancy, third trimester: Secondary | ICD-10-CM | POA: Insufficient documentation

## 2020-03-17 DIAGNOSIS — O99891 Other specified diseases and conditions complicating pregnancy: Secondary | ICD-10-CM

## 2020-03-17 DIAGNOSIS — Z3A36 36 weeks gestation of pregnancy: Secondary | ICD-10-CM | POA: Insufficient documentation

## 2020-03-17 DIAGNOSIS — O99013 Anemia complicating pregnancy, third trimester: Secondary | ICD-10-CM | POA: Insufficient documentation

## 2020-03-17 DIAGNOSIS — O26893 Other specified pregnancy related conditions, third trimester: Secondary | ICD-10-CM | POA: Insufficient documentation

## 2020-03-17 DIAGNOSIS — Z79899 Other long term (current) drug therapy: Secondary | ICD-10-CM | POA: Insufficient documentation

## 2020-03-17 DIAGNOSIS — R030 Elevated blood-pressure reading, without diagnosis of hypertension: Secondary | ICD-10-CM | POA: Insufficient documentation

## 2020-03-17 DIAGNOSIS — J45909 Unspecified asthma, uncomplicated: Secondary | ICD-10-CM | POA: Insufficient documentation

## 2020-03-17 DIAGNOSIS — Z7982 Long term (current) use of aspirin: Secondary | ICD-10-CM | POA: Insufficient documentation

## 2020-03-17 DIAGNOSIS — D649 Anemia, unspecified: Secondary | ICD-10-CM

## 2020-03-17 DIAGNOSIS — O4703 False labor before 37 completed weeks of gestation, third trimester: Secondary | ICD-10-CM | POA: Insufficient documentation

## 2020-03-17 DIAGNOSIS — O479 False labor, unspecified: Secondary | ICD-10-CM

## 2020-03-17 DIAGNOSIS — R519 Headache, unspecified: Secondary | ICD-10-CM

## 2020-03-17 LAB — COMPREHENSIVE METABOLIC PANEL
ALT: 26 U/L (ref 0–44)
AST: 38 U/L (ref 15–41)
Albumin: 2.7 g/dL — ABNORMAL LOW (ref 3.5–5.0)
Alkaline Phosphatase: 128 U/L — ABNORMAL HIGH (ref 38–126)
Anion gap: 10 (ref 5–15)
BUN: 7 mg/dL (ref 6–20)
CO2: 20 mmol/L — ABNORMAL LOW (ref 22–32)
Calcium: 8.6 mg/dL — ABNORMAL LOW (ref 8.9–10.3)
Chloride: 108 mmol/L (ref 98–111)
Creatinine, Ser: 0.74 mg/dL (ref 0.44–1.00)
GFR calc Af Amer: >60 mL/min (ref >60)
GFR calc non Af Amer: >60 mL/min (ref >60)
Glucose, Bld: 91 mg/dL (ref 70–99)
Potassium: 3.5 mmol/L (ref 3.5–5.1)
Sodium: 138 mmol/L (ref 135–145)
Total Bilirubin: 0.5 mg/dL (ref 0.3–1.2)
Total Protein: 5.9 g/dL — ABNORMAL LOW (ref 6.5–8.1)

## 2020-03-17 LAB — URINALYSIS, ROUTINE W REFLEX MICROSCOPIC
Bilirubin Urine: NEGATIVE
Glucose, UA: NEGATIVE mg/dL
Hgb urine dipstick: NEGATIVE
Ketones, ur: NEGATIVE mg/dL
Leukocytes,Ua: NEGATIVE
Nitrite: NEGATIVE
Protein, ur: 30 mg/dL — AB
Specific Gravity, Urine: 1.012 (ref 1.005–1.030)
pH: 5 (ref 5.0–8.0)

## 2020-03-17 LAB — CBC
HCT: 28.8 % — ABNORMAL LOW (ref 36.0–46.0)
Hemoglobin: 9.1 g/dL — ABNORMAL LOW (ref 12.0–15.0)
MCH: 25.9 pg — ABNORMAL LOW (ref 26.0–34.0)
MCHC: 31.6 g/dL (ref 30.0–36.0)
MCV: 82.1 fL (ref 80.0–100.0)
Platelets: 232 10*3/uL (ref 150–400)
RBC: 3.51 MIL/uL — ABNORMAL LOW (ref 3.87–5.11)
RDW: 14.1 % (ref 11.5–15.5)
WBC: 8.6 10*3/uL (ref 4.0–10.5)
nRBC: 0 % (ref 0.0–0.2)

## 2020-03-17 LAB — PROTEIN / CREATININE RATIO, URINE
Creatinine, Urine: 110.32 mg/dL
Protein Creatinine Ratio: 0.3 mg/mg{Cre} — ABNORMAL HIGH (ref 0.00–0.15)
Total Protein, Urine: 33 mg/dL

## 2020-03-17 MED ORDER — DEXAMETHASONE 6 MG PO TABS
10.0000 mg | ORAL_TABLET | Freq: Once | ORAL | Status: AC
  Administered 2020-03-17: 10 mg via ORAL
  Filled 2020-03-17: qty 1

## 2020-03-17 MED ORDER — METOCLOPRAMIDE HCL 10 MG PO TABS
10.0000 mg | ORAL_TABLET | Freq: Once | ORAL | Status: AC
  Administered 2020-03-17: 10 mg via ORAL
  Filled 2020-03-17: qty 1

## 2020-03-17 MED ORDER — DIPHENHYDRAMINE HCL 25 MG PO CAPS
25.0000 mg | ORAL_CAPSULE | Freq: Once | ORAL | Status: AC
  Administered 2020-03-17: 25 mg via ORAL
  Filled 2020-03-17: qty 1

## 2020-03-17 NOTE — MAU Provider Note (Signed)
Chief Complaint:  Contractions and Headache   First Provider Initiated Contact with Patient 03/17/20 2158      HPI: Nicole Schaefer is a 19 y.o. G1P0 at 52w4dby early ultrasound who presents to maternity admissions reporting onset of cramping and contractions today while at work and headache not resolved with Tylenol. She reports the contractions are low in her abdomen and low back and are every 5 minutes.  The headache is similar to her headaches throughout this pregnancy. She has tried Fioricet and Flexeril which have not helped but has not taken those today.  She reports that Tylenol and drinking coffee have helped the most.  She has not had caffeine today.  She has appt with headache specialist later this month for evaluation.  There are no other symptoms.  She reports good fetal movement, denies LOF, vaginal bleeding, vaginal itching/burning, urinary symptoms, dizziness, n/v, or fever/chills.     Location: lower abdomen radiates to low back Quality: cramping Severity: 8/10 on pain scale Duration: 1 day Timing: intermittent, every 5 minutes Modifying factors: none Associated signs and symptoms: headache  HPI  Past Medical History: Past Medical History:  Diagnosis Date  . Asthma    has been a while since inhaler/neb use   . Varicose vein of leg     Past obstetric history: OB History  Gravida Para Term Preterm AB Living  1            SAB TAB Ectopic Multiple Live Births        0      # Outcome Date GA Lbr Len/2nd Weight Sex Delivery Anes PTL Lv  1 Current             Past Surgical History: Past Surgical History:  Procedure Laterality Date  . LEG SURGERY Left     Family History: Family History  Problem Relation Age of Onset  . Healthy Mother   . Healthy Father   . Hypertension Father   . Hyperlipidemia Father   . Sleep apnea Father   . Diabetes Paternal Grandmother   . Heart attack Paternal Grandmother     Social History: Social History   Tobacco Use  .  Smoking status: Never Smoker  . Smokeless tobacco: Never Used  Vaping Use  . Vaping Use: Never used  Substance Use Topics  . Alcohol use: No  . Drug use: No    Allergies:  Allergies  Allergen Reactions  . Pollen Extract     Runny nose    Meds:  Medications Prior to Admission  Medication Sig Dispense Refill Last Dose  . aspirin EC 81 MG tablet Take 1 tablet (81 mg total) by mouth daily. 60 tablet 2 03/17/2020 at Unknown time  . Blood Pressure Monitor KIT 1 Device by Does not apply route once a week. To be monitored Regularly at home. 1 kit 0 03/17/2020 at Unknown time  . Cholecalciferol (VITAMIN D3 PO) Take by mouth.   03/17/2020 at Unknown time  . Elastic Bandages & Supports (COMFORT FIT MATERNITY SUPP LG) MISC 1 Units by Does not apply route daily. 1 each 0 Past Month at Unknown time  . loratadine (CLARITIN) 10 MG tablet Take 10 mg by mouth daily.   03/17/2020 at Unknown time  . Prenatal Vit-Fe Fumarate-FA (PRENATAL MULTIVITAMIN) TABS tablet Take 1 tablet by mouth daily at 12 noon.   03/17/2020 at Unknown time  . albuterol (VENTOLIN HFA) 108 (90 Base) MCG/ACT inhaler Inhale into the lungs every 6 (six)  hours as needed.   More than a month at Unknown time  . cyclobenzaprine (FLEXERIL) 10 MG tablet Take 1 tablet (10 mg total) by mouth 3 (three) times daily as needed. 30 tablet 0 More than a month at Unknown time  . fluticasone (FLOVENT HFA) 44 MCG/ACT inhaler Inhale into the lungs 2 (two) times daily.   More than a month at Unknown time  . Mometasone Furo-Formoterol Fum 50-5 MCG/ACT AERO Inhale into the lungs.     . Nebulizer MISC Diagnosis: Wheezing R06.2 Medically necessary Dispense: 1 neb machine for use with albuterol nebs 1 each 0     ROS:  Review of Systems  Constitutional: Negative for chills, fatigue and fever.  Eyes: Negative for visual disturbance.  Respiratory: Negative for shortness of breath.   Cardiovascular: Negative for chest pain.  Gastrointestinal: Positive for  abdominal pain. Negative for nausea and vomiting.  Genitourinary: Negative for difficulty urinating, dysuria, flank pain, pelvic pain, vaginal bleeding, vaginal discharge and vaginal pain.  Musculoskeletal: Positive for back pain.  Neurological: Positive for headaches. Negative for dizziness.  Psychiatric/Behavioral: Negative.      I have reviewed patient's Past Medical Hx, Surgical Hx, Family Hx, Social Hx, medications and allergies.   Physical Exam   Patient Vitals for the past 24 hrs:  BP Temp Temp src Pulse Resp Weight  03/17/20 2331 136/84 -- -- 85 -- --  03/17/20 2316 134/89 -- -- 92 -- --  03/17/20 2301 129/77 -- -- 79 -- --  03/17/20 2246 137/84 -- -- 82 -- --  03/17/20 2231 124/71 -- -- 90 -- --  03/17/20 2216 137/81 -- -- 98 -- --  03/17/20 2201 136/78 -- -- 91 -- --  03/17/20 2113 (!) 144/90 100 F (37.8 C) Oral 99 16 106.4 kg   Constitutional: Well-developed, well-nourished female in no acute distress.  Cardiovascular: normal rate Respiratory: normal effort GI: Abd soft, non-tender, gravid appropriate for gestational age.  MS: Extremities nontender, no edema, normal ROM Neurologic: Alert and oriented x 4.  GU: Neg CVAT.   Dilation: 1 Effacement (%): 50 Station: Ballotable Presentation: Undeterminable Exam by:: Fatima Blank, CNM  FHT:  Baseline 145 , moderate variability, accelerations present, no decelerations Contractions: q 2-5 mins, mild to palpation    Labs: Results for orders placed or performed during the hospital encounter of 03/17/20 (from the past 24 hour(s))  Protein / creatinine ratio, urine     Status: Abnormal   Collection Time: 03/17/20  9:40 PM  Result Value Ref Range   Creatinine, Urine 110.32 mg/dL   Total Protein, Urine 33 mg/dL   Protein Creatinine Ratio 0.30 (H) 0.00 - 0.15 mg/mg[Cre]  Urinalysis, Routine w reflex microscopic Urine, Clean Catch     Status: Abnormal   Collection Time: 03/17/20  9:41 PM  Result Value Ref Range    Color, Urine YELLOW YELLOW   APPearance HAZY (A) CLEAR   Specific Gravity, Urine 1.012 1.005 - 1.030   pH 5.0 5.0 - 8.0   Glucose, UA NEGATIVE NEGATIVE mg/dL   Hgb urine dipstick NEGATIVE NEGATIVE   Bilirubin Urine NEGATIVE NEGATIVE   Ketones, ur NEGATIVE NEGATIVE mg/dL   Protein, ur 30 (A) NEGATIVE mg/dL   Nitrite NEGATIVE NEGATIVE   Leukocytes,Ua NEGATIVE NEGATIVE   RBC / HPF 0-5 0 - 5 RBC/hpf   WBC, UA 0-5 0 - 5 WBC/hpf   Bacteria, UA RARE (A) NONE SEEN   Squamous Epithelial / LPF 6-10 0 - 5   Mucus PRESENT  CBC     Status: Abnormal   Collection Time: 03/17/20  9:48 PM  Result Value Ref Range   WBC 8.6 4.0 - 10.5 K/uL   RBC 3.51 (L) 3.87 - 5.11 MIL/uL   Hemoglobin 9.1 (L) 12.0 - 15.0 g/dL   HCT 28.8 (L) 36 - 46 %   MCV 82.1 80.0 - 100.0 fL   MCH 25.9 (L) 26.0 - 34.0 pg   MCHC 31.6 30.0 - 36.0 g/dL   RDW 14.1 11.5 - 15.5 %   Platelets 232 150 - 400 K/uL   nRBC 0.0 0.0 - 0.2 %  Comprehensive metabolic panel     Status: Abnormal   Collection Time: 03/17/20  9:48 PM  Result Value Ref Range   Sodium 138 135 - 145 mmol/L   Potassium 3.5 3.5 - 5.1 mmol/L   Chloride 108 98 - 111 mmol/L   CO2 20 (L) 22 - 32 mmol/L   Glucose, Bld 91 70 - 99 mg/dL   BUN 7 6 - 20 mg/dL   Creatinine, Ser 0.74 0.44 - 1.00 mg/dL   Calcium 8.6 (L) 8.9 - 10.3 mg/dL   Total Protein 5.9 (L) 6.5 - 8.1 g/dL   Albumin 2.7 (L) 3.5 - 5.0 g/dL   AST 38 15 - 41 U/L   ALT 26 0 - 44 U/L   Alkaline Phosphatase 128 (H) 38 - 126 U/L   Total Bilirubin 0.5 0.3 - 1.2 mg/dL   GFR calc non Af Amer >60 >60 mL/min   GFR calc Af Amer >60 >60 mL/min   Anion gap 10 5 - 15   B/Positive/-- (04/14 1036)  Imaging:    MAU Course/MDM: Orders Placed This Encounter  Procedures  . CBC  . Comprehensive metabolic panel  . Protein / creatinine ratio, urine  . Urinalysis, Routine w reflex microscopic  . Discharge patient    Meds ordered this encounter  Medications  . FOLLOWED BY Linked Order Group   .  diphenhydrAMINE (BENADRYL) capsule 25 mg   . metoCLOPramide (REGLAN) tablet 10 mg   . dexamethasone (DECADRON) tablet 10 mg     NST reviewed and reactive Headache resolved with headache cocktail given PO (Benadryl 25 mg PO, Reglan 10 mg PO, Decadron 10 mg PO) PEC labs wnl except P/C ratio 0.30 Consult Dr Ilda Basset with presentation, exam findings and test results.  Given that pt is mostly normotensive with a single elevated BP, and headache resolved, plan to recheck BP on Monday, 03/20/20, in the office Anemia noted today, no anemia on prior 28 week lab results.  If h/a persist and BP is normal on Monday's recheck, consider repeating CBC PEC precautions and labor precautions/reasons to return to MAU reviewed    Assessment: 1. Braxton Hicks contractions   2. Elevated blood pressure reading without diagnosis of hypertension   3. Headache in pregnancy, antepartum, third trimester   4. Anemia affecting pregnancy in third trimester   5. [redacted] weeks gestation of pregnancy     Plan: Discharge home Labor precautions and fetal kick counts  Follow-up Information    [facility] Follow up.   Why: Blood pressure check on Monday 03/20/20.  Return to MAU as needed for signs of labor or emergencies. Contact information: [address] [place] [address] [phone]             Allergies as of 03/17/2020      Reactions   Pollen Extract    Runny nose  Medication List    TAKE these medications   albuterol 108 (90 Base) MCG/ACT inhaler Commonly known as: VENTOLIN HFA Inhale into the lungs every 6 (six) hours as needed.   aspirin EC 81 MG tablet Take 1 tablet (81 mg total) by mouth daily.   Blood Pressure Monitor Kit 1 Device by Does not apply route once a week. To be monitored Regularly at home.   Comfort Fit Maternity Supp Lg Misc 1 Units by Does not apply route daily.   cyclobenzaprine 10 MG tablet Commonly known as: FLEXERIL Take  1 tablet (10 mg total) by mouth 3 (three) times daily as needed.   fluticasone 44 MCG/ACT inhaler Commonly known as: FLOVENT HFA Inhale into the lungs 2 (two) times daily.   loratadine 10 MG tablet Commonly known as: CLARITIN Take 10 mg by mouth daily.   Mometasone Furo-Formoterol Fum 50-5 MCG/ACT Aero Inhale into the lungs.   Nebulizer Misc Diagnosis: Wheezing R06.2 Medically necessary Dispense: 1 neb machine for use with albuterol nebs   prenatal multivitamin Tabs tablet Take 1 tablet by mouth daily at 12 noon.   VITAMIN D3 PO Take by mouth.       Fatima Blank Certified Nurse-Midwife 03/17/2020 11:54 PM

## 2020-03-17 NOTE — MAU Note (Signed)
.   Nicole Schaefer is a 19 y.o. at [redacted]w[redacted]d here in MAU reporting: Ctx that started at 2030. No Vb or LOF. Patient also had headache. Denies blurred vision and epigastric pain. Patient was 2.5 last office visit. GBS neg.   Pain score: 8 Vitals:   03/17/20 2113  BP: (!) 144/90  Pulse: 99  Resp: 16  Temp: 100 F (37.8 C)     FHT:143 Lab orders placed from triage: UA

## 2020-03-17 NOTE — Discharge Instructions (Signed)
Reasons to return to MAU at [facility]:  1.  Contractions are  5 minutes apart or less, each last 1 minute, these have been going on for 1-2 hours, and you cannot walk or talk during them 2.  You have a large gush of fluid, or a trickle of fluid that will not stop and you have to wear a pad 3.  You have bleeding that is bright red, heavier than spotting--like menstrual bleeding (spotting can be normal in early labor or after a check of your cervix) 4.  You do not feel the baby moving like he/she normally does  

## 2020-03-18 ENCOUNTER — Inpatient Hospital Stay (HOSPITAL_COMMUNITY)
Admission: AD | Admit: 2020-03-18 | Discharge: 2020-03-22 | DRG: 806 | Disposition: A | Discharge status: Home / Self Care | Payer: Medicaid Other | Attending: Family Medicine | Admitting: Family Medicine

## 2020-03-18 ENCOUNTER — Other Ambulatory Visit: Payer: Self-pay

## 2020-03-18 ENCOUNTER — Encounter (HOSPITAL_COMMUNITY): Payer: Self-pay | Admitting: Obstetrics and Gynecology

## 2020-03-18 DIAGNOSIS — O99214 Obesity complicating childbirth: Secondary | ICD-10-CM | POA: Diagnosis present

## 2020-03-18 DIAGNOSIS — R519 Headache, unspecified: Secondary | ICD-10-CM | POA: Diagnosis present

## 2020-03-18 DIAGNOSIS — O4703 False labor before 37 completed weeks of gestation, third trimester: Secondary | ICD-10-CM | POA: Diagnosis not present

## 2020-03-18 DIAGNOSIS — Z3400 Encounter for supervision of normal first pregnancy, unspecified trimester: Secondary | ICD-10-CM

## 2020-03-18 DIAGNOSIS — O1414 Severe pre-eclampsia complicating childbirth: Principal | ICD-10-CM | POA: Diagnosis present

## 2020-03-18 DIAGNOSIS — O99891 Other specified diseases and conditions complicating pregnancy: Secondary | ICD-10-CM | POA: Diagnosis not present

## 2020-03-18 DIAGNOSIS — O1403 Mild to moderate pre-eclampsia, third trimester: Secondary | ICD-10-CM | POA: Diagnosis present

## 2020-03-18 DIAGNOSIS — O9081 Anemia of the puerperium: Secondary | ICD-10-CM | POA: Diagnosis present

## 2020-03-18 DIAGNOSIS — J45990 Exercise induced bronchospasm: Secondary | ICD-10-CM | POA: Diagnosis present

## 2020-03-18 DIAGNOSIS — O9952 Diseases of the respiratory system complicating childbirth: Secondary | ICD-10-CM | POA: Diagnosis present

## 2020-03-18 DIAGNOSIS — Z20822 Contact with and (suspected) exposure to covid-19: Secondary | ICD-10-CM | POA: Diagnosis present

## 2020-03-18 DIAGNOSIS — D62 Acute posthemorrhagic anemia: Secondary | ICD-10-CM | POA: Diagnosis present

## 2020-03-18 DIAGNOSIS — Z3A37 37 weeks gestation of pregnancy: Secondary | ICD-10-CM

## 2020-03-18 DIAGNOSIS — R030 Elevated blood-pressure reading, without diagnosis of hypertension: Secondary | ICD-10-CM | POA: Diagnosis not present

## 2020-03-18 DIAGNOSIS — Z3A36 36 weeks gestation of pregnancy: Secondary | ICD-10-CM | POA: Diagnosis not present

## 2020-03-18 DIAGNOSIS — O1494 Unspecified pre-eclampsia, complicating childbirth: Secondary | ICD-10-CM | POA: Diagnosis not present

## 2020-03-18 LAB — COMPREHENSIVE METABOLIC PANEL
ALT: 27 U/L (ref 0–44)
AST: 36 U/L (ref 15–41)
Albumin: 2.9 g/dL — ABNORMAL LOW (ref 3.5–5.0)
Alkaline Phosphatase: 116 U/L (ref 38–126)
Anion gap: 9 (ref 5–15)
BUN: 5 mg/dL — ABNORMAL LOW (ref 6–20)
CO2: 20 mmol/L — ABNORMAL LOW (ref 22–32)
Calcium: 8.9 mg/dL (ref 8.9–10.3)
Chloride: 107 mmol/L (ref 98–111)
Creatinine, Ser: 0.77 mg/dL (ref 0.44–1.00)
GFR calc Af Amer: >60 mL/min (ref >60)
GFR calc non Af Amer: >60 mL/min (ref >60)
Glucose, Bld: 75 mg/dL (ref 70–99)
Potassium: 3.4 mmol/L — ABNORMAL LOW (ref 3.5–5.1)
Sodium: 136 mmol/L (ref 135–145)
Total Bilirubin: 0.4 mg/dL (ref 0.3–1.2)
Total Protein: 6.5 g/dL (ref 6.5–8.1)

## 2020-03-18 LAB — CBC
HCT: 29.7 % — ABNORMAL LOW (ref 36.0–46.0)
Hemoglobin: 9.4 g/dL — ABNORMAL LOW (ref 12.0–15.0)
MCH: 25.8 pg — ABNORMAL LOW (ref 26.0–34.0)
MCHC: 31.6 g/dL (ref 30.0–36.0)
MCV: 81.6 fL (ref 80.0–100.0)
Platelets: 260 10*3/uL (ref 150–400)
RBC: 3.64 MIL/uL — ABNORMAL LOW (ref 3.87–5.11)
RDW: 14.2 % (ref 11.5–15.5)
WBC: 10.6 10*3/uL — ABNORMAL HIGH (ref 4.0–10.5)
nRBC: 0 % (ref 0.0–0.2)

## 2020-03-18 LAB — URINALYSIS, ROUTINE W REFLEX MICROSCOPIC
Bacteria, UA: NONE SEEN
Bilirubin Urine: NEGATIVE
Glucose, UA: NEGATIVE mg/dL
Hgb urine dipstick: NEGATIVE
Ketones, ur: NEGATIVE mg/dL
Leukocytes,Ua: NEGATIVE
Nitrite: NEGATIVE
Protein, ur: 30 mg/dL — AB
Specific Gravity, Urine: 1.01 (ref 1.005–1.030)
pH: 6 (ref 5.0–8.0)

## 2020-03-18 LAB — RESPIRATORY PANEL BY RT PCR (FLU A&B, COVID)
Influenza A by PCR: NEGATIVE
Influenza B by PCR: NEGATIVE
SARS Coronavirus 2 by RT PCR: NEGATIVE

## 2020-03-18 LAB — PROTEIN / CREATININE RATIO, URINE
Creatinine, Urine: 110.51 mg/dL
Protein Creatinine Ratio: 0.42 mg/mg{Cre} — ABNORMAL HIGH (ref 0.00–0.15)
Total Protein, Urine: 46 mg/dL

## 2020-03-18 LAB — TYPE AND SCREEN
ABO/RH(D): B POS
Antibody Screen: NEGATIVE

## 2020-03-18 MED ORDER — SODIUM CHLORIDE 0.9 % IV SOLN: 250.0000 mL | INTRAVENOUS | Status: DC | PRN

## 2020-03-18 MED ORDER — ACETAMINOPHEN 325 MG PO TABS
650.0000 mg | ORAL_TABLET | ORAL | Status: DC | PRN
  Administered 2020-03-18 – 2020-03-19 (×2): 650 mg via ORAL
  Filled 2020-03-18 (×2): qty 2

## 2020-03-18 MED ORDER — LACTATED RINGERS IV BOLUS: 1000.0000 mL | Freq: Once | INTRAVENOUS | Status: DC

## 2020-03-18 MED ORDER — CALCIUM CARBONATE ANTACID 500 MG PO CHEW: 2.0000 | CHEWABLE_TABLET | ORAL | Status: DC | PRN

## 2020-03-18 MED ORDER — SODIUM CHLORIDE 0.9% FLUSH: 3.0000 mL | INTRAVENOUS | Status: DC | PRN

## 2020-03-18 MED ORDER — DEXAMETHASONE SODIUM PHOSPHATE 10 MG/ML IJ SOLN: 10.0000 mg | Freq: Once | INTRAMUSCULAR | Status: DC

## 2020-03-18 MED ORDER — PROCHLORPERAZINE EDISYLATE 10 MG/2ML IJ SOLN
10.0000 mg | INTRAMUSCULAR | Status: DC | PRN
  Filled 2020-03-18: qty 2

## 2020-03-18 MED ORDER — DOCUSATE SODIUM 100 MG PO CAPS
100.0000 mg | ORAL_CAPSULE | Freq: Every day | ORAL | Status: DC
  Filled 2020-03-18: qty 1

## 2020-03-18 MED ORDER — DIPHENHYDRAMINE HCL 50 MG/ML IJ SOLN: 25.0000 mg | Freq: Once | INTRAMUSCULAR | Status: DC

## 2020-03-18 MED ORDER — SODIUM CHLORIDE 0.9% FLUSH: 3.0000 mL | Freq: Two times a day (BID) | INTRAVENOUS | Status: DC

## 2020-03-18 MED ORDER — PRENATAL MULTIVITAMIN CH
1.0000 | ORAL_TABLET | Freq: Every day | ORAL | Status: DC
  Administered 2020-03-19: 1 via ORAL
  Filled 2020-03-18 (×2): qty 1

## 2020-03-18 MED ORDER — ZOLPIDEM TARTRATE 5 MG PO TABS: 5.0000 mg | ORAL_TABLET | Freq: Every evening | ORAL | Status: DC | PRN

## 2020-03-18 NOTE — H&P (Signed)
History     CSN: [id number]  Arrival date and time: 03/18/20 1534   First Provider Initiated Contact with Patient 03/18/20 1648      Chief Complaint  Patient presents with  . Headache  . Hypertension   HPI Nicole Schaefer is a 19 y.o. G1P0 at 5w5dwho presents with a headache. She states the headache is a 10/10 and she tried tylenol at 1400 with no relief. She states this headache is no different than the headaches she has had throughout the pregnancy. She states her headache never really went away last night and she has daily headaches that feel like this. She denies any visual changes or epigastric pain. She denies any leaking or bleeding. Reports normal fetal movement. She states she checked her BP at home and it was >140/90 so she came to the hospital  OB History    Gravida  1   Para      Term      Preterm      AB      Living        SAB      TAB      Ectopic      Multiple  0   Live Births              Past Medical History:  Diagnosis Date  . Asthma    has been a while since inhaler/neb use   . Varicose vein of leg     Past Surgical History:  Procedure Laterality Date  . LEG SURGERY Left     Family History  Problem Relation Age of Onset  . Healthy Mother   . Healthy Father   . Hypertension Father   . Hyperlipidemia Father   . Sleep apnea Father   . Diabetes Paternal Grandmother   . Heart attack Paternal Grandmother     Social History   Tobacco Use  . Smoking status: Never Smoker  . Smokeless tobacco: Never Used  Vaping Use  . Vaping Use: Never used  Substance Use Topics  . Alcohol use: No  . Drug use: No    Allergies:  Allergies  Allergen Reactions  . Pollen Extract     Runny nose    Medications Prior to Admission  Medication Sig Dispense Refill Last Dose  . albuterol (VENTOLIN HFA) 108 (90 Base) MCG/ACT inhaler Inhale into the lungs every 6 (six) hours as needed.     .Marland Kitchenaspirin EC 81 MG tablet Take 1 tablet (81 mg total)  by mouth daily. 60 tablet 2   . Blood Pressure Monitor KIT 1 Device by Does not apply route once a week. To be monitored Regularly at home. 1 kit 0   . Cholecalciferol (VITAMIN D3 PO) Take by mouth.     . cyclobenzaprine (FLEXERIL) 10 MG tablet Take 1 tablet (10 mg total) by mouth 3 (three) times daily as needed. 30 tablet 0   . Elastic Bandages & Supports (COMFORT FIT MATERNITY SUPP LG) MISC 1 Units by Does not apply route daily. 1 each 0   . fluticasone (FLOVENT HFA) 44 MCG/ACT inhaler Inhale into the lungs 2 (two) times daily.     .Marland Kitchenloratadine (CLARITIN) 10 MG tablet Take 10 mg by mouth daily.     . Mometasone Furo-Formoterol Fum 50-5 MCG/ACT AERO Inhale into the lungs.     . Nebulizer MISC Diagnosis: Wheezing R06.2 Medically necessary Dispense: 1 neb machine for use with albuterol nebs 1 each 0   .  Prenatal Vit-Fe Fumarate-FA (PRENATAL MULTIVITAMIN) TABS tablet Take 1 tablet by mouth daily at 12 noon.       Review of Systems  Constitutional: Negative.  Negative for fatigue and fever.  HENT: Negative.   Respiratory: Negative.  Negative for shortness of breath.   Cardiovascular: Negative.  Negative for chest pain.  Gastrointestinal: Negative.  Negative for abdominal pain, constipation, diarrhea, nausea and vomiting.  Genitourinary: Negative.  Negative for dysuria, vaginal bleeding and vaginal discharge.  Neurological: Positive for headaches. Negative for dizziness.   Physical Exam   Blood pressure (!) 149/83, pulse 95, temperature 98.5 F (36.9 C), temperature source Oral, resp. rate 16, last menstrual period 06/11/2019, SpO2 98 %, unknown if currently breastfeeding.  Patient Vitals for the past 24 hrs:  BP Temp Temp src Pulse Resp SpO2  03/18/20 1900 136/89 -- -- (!) 101 -- 99 %  03/18/20 1830 124/64 -- -- 88 -- 100 %  03/18/20 1800 (!) 151/88 -- -- (!) 110 -- --  03/18/20 1745 (!) 144/92 -- -- (!) 109 -- 98 %  03/18/20 1730 (!) 142/83 -- -- 99 -- 100 %  03/18/20 1715 137/84 --  -- 100 -- 100 %  03/18/20 1700 137/86 -- -- (!) 101 -- 100 %  03/18/20 1645 135/78 -- -- 98 -- --  03/18/20 1639 (!) 149/83 -- -- 95 -- --  03/18/20 1552 (!) 145/88 98.5 F (36.9 C) Oral (!) 104 16 98 %     Physical Exam Vitals and nursing note reviewed.  Constitutional:      General: She is not in acute distress.    Appearance: She is well-developed.  HENT:     Head: Normocephalic.  Eyes:     Pupils: Pupils are equal, round, and reactive to light.  Cardiovascular:     Rate and Rhythm: Normal rate and regular rhythm.     Heart sounds: Normal heart sounds.  Pulmonary:     Effort: Pulmonary effort is normal. No respiratory distress.     Breath sounds: Normal breath sounds.  Abdominal:     General: Bowel sounds are normal. There is no distension.     Palpations: Abdomen is soft.     Tenderness: There is no abdominal tenderness.  Skin:    General: Skin is warm and dry.  Neurological:     Mental Status: She is alert and oriented to person, place, and time.     Motor: No abnormal muscle tone.     Coordination: Coordination normal.     Deep Tendon Reflexes: Reflexes are normal and symmetric. Reflexes normal.  Psychiatric:        Behavior: Behavior normal.        Thought Content: Thought content normal.        Judgment: Judgment normal.     MAU Course  Procedures Results for orders placed or performed during the hospital encounter of 03/18/20 (from the past 24 hour(s))  Urinalysis, Routine w reflex microscopic Urine, Clean Catch     Status: Abnormal   Collection Time: 03/18/20  4:17 PM  Result Value Ref Range   Color, Urine YELLOW YELLOW   APPearance CLEAR CLEAR   Specific Gravity, Urine 1.010 1.005 - 1.030   pH 6.0 5.0 - 8.0   Glucose, UA NEGATIVE NEGATIVE mg/dL   Hgb urine dipstick NEGATIVE NEGATIVE   Bilirubin Urine NEGATIVE NEGATIVE   Ketones, ur NEGATIVE NEGATIVE mg/dL   Protein, ur 30 (A) NEGATIVE mg/dL   Nitrite NEGATIVE NEGATIVE   Leukocytes,Ua  NEGATIVE  NEGATIVE   RBC / HPF 0-5 0 - 5 RBC/hpf   WBC, UA 0-5 0 - 5 WBC/hpf   Bacteria, UA NONE SEEN NONE SEEN   Squamous Epithelial / LPF 0-5 0 - 5  Protein / creatinine ratio, urine     Status: Abnormal   Collection Time: 03/18/20  4:17 PM  Result Value Ref Range   Creatinine, Urine 110.51 mg/dL   Total Protein, Urine 46 mg/dL   Protein Creatinine Ratio 0.42 (H) 0.00 - 0.15 mg/mg[Cre]  CBC     Status: Abnormal   Collection Time: 03/18/20  5:53 PM  Result Value Ref Range   WBC 10.6 (H) 4.0 - 10.5 K/uL   RBC 3.64 (L) 3.87 - 5.11 MIL/uL   Hemoglobin 9.4 (L) 12.0 - 15.0 g/dL   HCT 29.7 (L) 36 - 46 %   MCV 81.6 80.0 - 100.0 fL   MCH 25.8 (L) 26.0 - 34.0 pg   MCHC 31.6 30.0 - 36.0 g/dL   RDW 14.2 11.5 - 15.5 %   Platelets 260 150 - 400 K/uL   nRBC 0.0 0.0 - 0.2 %  Comprehensive metabolic panel     Status: Abnormal   Collection Time: 03/18/20  5:53 PM  Result Value Ref Range   Sodium 136 135 - 145 mmol/L   Potassium 3.4 (L) 3.5 - 5.1 mmol/L   Chloride 107 98 - 111 mmol/L   CO2 20 (L) 22 - 32 mmol/L   Glucose, Bld 75 70 - 99 mg/dL   BUN 5 (L) 6 - 20 mg/dL   Creatinine, Ser 0.77 0.44 - 1.00 mg/dL   Calcium 8.9 8.9 - 10.3 mg/dL   Total Protein 6.5 6.5 - 8.1 g/dL   Albumin 2.9 (L) 3.5 - 5.0 g/dL   AST 36 15 - 41 U/L   ALT 27 0 - 44 U/L   Alkaline Phosphatase 116 38 - 126 U/L   Total Bilirubin 0.4 0.3 - 1.2 mg/dL   GFR calc non Af Amer >60 >60 mL/min   GFR calc Af Amer >60 >60 mL/min   Anion gap 9 5 - 15   MDM Consulted with Dr. Elgie Congo regarding presentation- recommends repeating lab work and giving HA cocktail  Discussed with patient that due to her preterm status and no severe features, severe preeclampsia has to be confirmed before preterm IOL. Otherwise we can schedule patient for 37w IOL for preeclampsia without features.   UA CBC, CMP, Protein/creat ratio LR bolus Headache cocktail IV- decadron 50m, compazine 16m diphenhydramine 2586m Patient refusing treatment for HA. She  is refusing IV access. States she just wants to be induced. Reviewed with patient that unless labs show change to now severe preeclampsia, IOL occurs when the patient is term.   Reviewed results with Dr. BasElgie Congoecomm[facility] and repeat labs in the am  Assessment and Plan  -Preeclampsia without severe features  -[facility] [place]  CarWelch/07/2019, 4:48 PM

## 2020-03-18 NOTE — MAU Note (Signed)
2 RN assessed for IV start.  Patient states she does not want to be stuck in her arms and declines any other IV attempt at this time. Also declining the IV medication for her headache.  Patient is amendable to completing the ordered lab work; RN will call phlebotomy.  Cleone Slim CNM in department and made aware of patient's decisions.

## 2020-03-18 NOTE — MAU Note (Signed)
Pt reports to mau with c/o headache since waking up this morning.  Pt states she took 2 extra strength tylenol at 1330 with no relief.  Pt denies visual changes or upper quad pain.  Pt denies ctx or LOF and reports good fm.  Pt states she took her bp at home before coming in and got the following : 148/94, 164/110, 155/101, and 148/92.

## 2020-03-19 DIAGNOSIS — Z3A36 36 weeks gestation of pregnancy: Secondary | ICD-10-CM

## 2020-03-19 DIAGNOSIS — O1403 Mild to moderate pre-eclampsia, third trimester: Secondary | ICD-10-CM | POA: Diagnosis not present

## 2020-03-19 LAB — COMPREHENSIVE METABOLIC PANEL
ALT: 22 U/L (ref 0–44)
AST: 28 U/L (ref 15–41)
Albumin: 2.4 g/dL — ABNORMAL LOW (ref 3.5–5.0)
Alkaline Phosphatase: 99 U/L (ref 38–126)
Anion gap: 11 (ref 5–15)
BUN: 7 mg/dL (ref 6–20)
CO2: 19 mmol/L — ABNORMAL LOW (ref 22–32)
Calcium: 8.3 mg/dL — ABNORMAL LOW (ref 8.9–10.3)
Chloride: 107 mmol/L (ref 98–111)
Creatinine, Ser: 0.72 mg/dL (ref 0.44–1.00)
GFR calc Af Amer: >60 mL/min (ref >60)
GFR calc non Af Amer: >60 mL/min (ref >60)
Glucose, Bld: 101 mg/dL — ABNORMAL HIGH (ref 70–99)
Potassium: 3.4 mmol/L — ABNORMAL LOW (ref 3.5–5.1)
Sodium: 137 mmol/L (ref 135–145)
Total Bilirubin: 0.3 mg/dL (ref 0.3–1.2)
Total Protein: 5.4 g/dL — ABNORMAL LOW (ref 6.5–8.1)

## 2020-03-19 LAB — CBC
HCT: 26.5 % — ABNORMAL LOW (ref 36.0–46.0)
Hemoglobin: 8.3 g/dL — ABNORMAL LOW (ref 12.0–15.0)
MCH: 25.9 pg — ABNORMAL LOW (ref 26.0–34.0)
MCHC: 31.3 g/dL (ref 30.0–36.0)
MCV: 82.6 fL (ref 80.0–100.0)
Platelets: 223 10*3/uL (ref 150–400)
RBC: 3.21 MIL/uL — ABNORMAL LOW (ref 3.87–5.11)
RDW: 14.5 % (ref 11.5–15.5)
WBC: 8.3 10*3/uL (ref 4.0–10.5)
nRBC: 0 % (ref 0.0–0.2)

## 2020-03-19 MED ORDER — BUTALBITAL-APAP-CAFFEINE 50-325-40 MG PO TABS
1.0000 | ORAL_TABLET | ORAL | Status: DC | PRN
  Filled 2020-03-19: qty 1

## 2020-03-19 NOTE — Progress Notes (Signed)
FACULTY PRACTICE ANTEPARTUM PROGRESS NOTE  Nicole Schaefer is a 19 y.o. G1P0 at [redacted]w[redacted]d who is admitted for mildly elevated blood pressure and headache .  Estimated Date of Delivery: 04/10/20 Fetal presentation is cephalic.  Length of Stay:  1 Days. Admitted 03/18/2020  Subjective: Pt seen doing well.  She still notes mild headache.   Patient reports normal fetal movement.  She denies uterine contractions, denies bleeding and leaking of fluid per vagina.  Vitals:  Blood pressure 101/60, pulse 89, temperature 98.3 F (36.8 C), temperature source Oral, resp. rate 18, height 5\' 3"  (1.6 m), weight 105.2 kg, last menstrual period 06/11/2019, SpO2 100 %, unknown if currently breastfeeding. Physical Examination: CONSTITUTIONAL: Well-developed, well-nourished female in no acute distress.  HENT:  Normocephalic, atraumatic, External right and left ear normal. Oropharynx is clear and moist EYES: Conjunctivae and EOM are normal. Pupils are equal, round, and reactive to light. No scleral icterus.  NECK: Normal range of motion, supple, no masses. SKIN: Skin is warm and dry. No rash noted. Not diaphoretic. No erythema. No pallor. NEUROLGIC: Alert and oriented to person, place, and time. Normal reflexes, muscle tone coordination. No cranial nerve deficit noted. PSYCHIATRIC: Normal mood and affect. Normal behavior. Normal judgment and thought content. CARDIOVASCULAR: Normal heart rate noted, regular rhythm RESPIRATORY: Effort and breath sounds normal, no problems with respiration noted MUSCULOSKELETAL: Normal range of motion. No edema and no tenderness. ABDOMEN: Soft, nontender, nondistended, gravid. CERVIX: deferred  Fetal monitoring: FHR: 130 bpm, Variability: moderate, Accelerations: Present, Decelerations: Absent  Uterine activity: irritability   Results for orders placed or performed during the hospital encounter of 03/18/20 (from the past 48 hour(s))  Urinalysis, Routine w reflex microscopic Urine,  Clean Catch     Status: Abnormal   Collection Time: 03/18/20  4:17 PM  Result Value Ref Range   Color, Urine YELLOW YELLOW   APPearance CLEAR CLEAR   Specific Gravity, Urine 1.010 1.005 - 1.030   pH 6.0 5.0 - 8.0   Glucose, UA NEGATIVE NEGATIVE mg/dL   Hgb urine dipstick NEGATIVE NEGATIVE   Bilirubin Urine NEGATIVE NEGATIVE   Ketones, ur NEGATIVE NEGATIVE mg/dL   Protein, ur 30 (A) NEGATIVE mg/dL   Nitrite NEGATIVE NEGATIVE   Leukocytes,Ua NEGATIVE NEGATIVE   RBC / HPF 0-5 0 - 5 RBC/hpf   WBC, UA 0-5 0 - 5 WBC/hpf   Bacteria, UA NONE SEEN NONE SEEN   Squamous Epithelial / LPF 0-5 0 - 5    Comment: Performed at [facility], [address], [place], [place]  Protein / creatinine ratio, urine     Status: Abnormal   Collection Time: 03/18/20  4:17 PM  Result Value Ref Range   Creatinine, Urine 110.51 mg/dL   Total Protein, Urine 46 mg/dL    Comment: NO NORMAL RANGE ESTABLISHED FOR THIS TEST   Protein Creatinine Ratio 0.42 (H) 0.00 - 0.15 mg/mg[Cre]    Comment: Performed at [facility] Lab, [address] [place], [place]  CBC     Status: Abnormal   Collection Time: 03/18/20  5:53 PM  Result Value Ref Range   WBC 10.6 (H) 4.0 - 10.5 K/uL   RBC 3.64 (L) 3.87 - 5.11 MIL/uL   Hemoglobin 9.4 (L) 12.0 - 15.0 g/dL   HCT 05/18/20 (L) 36 - 46 %   MCV 81.6 80.0 - 100.0 fL   MCH 25.8 (L) 26.0 - 34.0 pg   MCHC 31.6 30.0 - 36.0 g/dL   RDW  14.2 11.5 - 15.5 %   Platelets 260 150 - 400 K/uL   nRBC 0.0 0.0 - 0.2 %    Comment: Performed at [facility], [address], [place], [place] [address]  Comprehensive metabolic panel     Status: Abnormal   Collection Time: 03/18/20  5:53 PM  Result Value Ref Range   Sodium 136 135 - 145 mmol/L   Potassium 3.4 (L) 3.5 - 5.1 mmol/L   Chloride 107 98 - 111 mmol/L   CO2 20 (L) 22 - 32 mmol/L   Glucose, Bld 75 70 - 99 mg/dL    Comment: Glucose reference range applies only to samples taken after fasting for at least 8  hours.   BUN 5 (L) 6 - 20 mg/dL   Creatinine, Ser 3.71 0.44 - 1.00 mg/dL   Calcium 8.9 8.9 - 06.2 mg/dL   Total Protein 6.5 6.5 - 8.1 g/dL   Albumin 2.9 (L) 3.5 - 5.0 g/dL   AST 36 15 - 41 U/L   ALT 27 0 - 44 U/L   Alkaline Phosphatase 116 38 - 126 U/L   Total Bilirubin 0.4 0.3 - 1.2 mg/dL   GFR calc non Af Amer >60 >60 mL/min   GFR calc Af Amer >60 >60 mL/min   Anion gap 9 5 - 15    Comment: Performed at [facility] Lab, [address] [place], [place] [address]  Type and screen [facility]     Status: None   Collection Time: 03/18/20  6:04 PM  Result Value Ref Range   ABO/RH(D) B POS    Antibody Screen NEG    Sample Expiration      03/21/2020,2359 Performed at [facility] Lab, [address], [place], [place] [address]   Respiratory Panel by RT PCR (Flu A&B, Covid) - Nasopharyngeal Swab     Status: None   Collection Time: 03/18/20  9:36 PM   Specimen: Nasopharyngeal Swab  Result Value Ref Range   SARS Coronavirus 2 by RT PCR NEGATIVE NEGATIVE    Comment: (NOTE) SARS-CoV-2 target nucleic acids are NOT DETECTED.  The SARS-CoV-2 RNA is generally detectable in upper respiratoy specimens during the acute phase of infection. The lowest concentration of SARS-CoV-2 viral copies this assay can detect is 131 copies/mL. A negative result does not preclude SARS-Cov-2 infection and should not be used as the sole basis for treatment or other patient management decisions. A negative result may occur with  improper specimen collection/handling, submission of specimen other than nasopharyngeal swab, presence of viral mutation(s) within the areas targeted by this assay, and inadequate number of viral copies (<131 copies/mL). A negative result must be combined with clinical observations, patient history, and epidemiological information. The expected result is Negative.  Fact Sheet for Patients:  https://www.moore.com/  Fact Sheet for Healthcare  Providers:  https://www.young.biz/  This test is no t yet approved or cleared by the [place] FDA and  has been authorized for detection and/or diagnosis of SARS-CoV-2 by FDA under an Emergency Use Authorization (EUA). This EUA will remain  in effect (meaning this test can be used) for the duration of the COVID-19 declaration under Section 564(b)(1) of the Act, 21 U.S.C. section 360bbb-3(b)(1), unless the authorization is terminated or revoked sooner.     Influenza A by PCR NEGATIVE NEGATIVE   Influenza B by PCR NEGATIVE NEGATIVE    Comment: (NOTE) The Xpert Xpress SARS-CoV-2/FLU/RSV assay is intended as an aid in  the diagnosis of influenza from Nasopharyngeal swab specimens and  should not be used as a sole basis for treatment. Nasal washings and  aspirates are unacceptable for Xpert Xpress SARS-CoV-2/FLU/RSV  testing.  Fact Sheet for Patients: https://www.moore.com/  Fact Sheet for Healthcare Providers: https://www.young.biz/  This test is not yet approved or cleared by the [place] FDA and  has been authorized for detection and/or diagnosis of SARS-CoV-2 by  FDA under an Emergency Use Authorization (EUA). This EUA will remain  in effect (meaning this test can be used) for the duration of the  Covid-19 declaration under Section 564(b)(1) of the Act, 21  U.S.C. section 360bbb-3(b)(1), unless the authorization is  terminated or revoked. Performed at [facility] Lab, [address] [place], [place] [address]   CBC on admission     Status: Abnormal   Collection Time: 03/19/20  5:16 AM  Result Value Ref Range   WBC 8.3 4.0 - 10.5 K/uL   RBC 3.21 (L) 3.87 - 5.11 MIL/uL   Hemoglobin 8.3 (L) 12.0 - 15.0 g/dL   HCT 25.0 (L) 36 - 46 %   MCV 82.6 80.0 - 100.0 fL   MCH 25.9 (L) 26.0 - 34.0 pg   MCHC 31.3 30.0 - 36.0 g/dL   RDW 53.9 76.7 - 34.1 %   Platelets 223 150 - 400 K/uL   nRBC 0.0 0.0 - 0.2 %     Comment: Performed at [facility], [address] [place], [place] [address]  Comprehensive metabolic panel     Status: Abnormal   Collection Time: 03/19/20  5:16 AM  Result Value Ref Range   Sodium 137 135 - 145 mmol/L   Potassium 3.4 (L) 3.5 - 5.1 mmol/L   Chloride 107 98 - 111 mmol/L   CO2 19 (L) 22 - 32 mmol/L   Glucose, Bld 101 (H) 70 - 99 mg/dL    Comment: Glucose reference range applies only to samples taken after fasting for at least 8 hours.   BUN 7 6 - 20 mg/dL   Creatinine, Ser 2.40 0.44 - 1.00 mg/dL   Calcium 8.3 (L) 8.9 - 10.3 mg/dL   Total Protein 5.4 (L) 6.5 - 8.1 g/dL   Albumin 2.4 (L) 3.5 - 5.0 g/dL   AST 28 15 - 41 U/L   ALT 22 0 - 44 U/L   Alkaline Phosphatase 99 38 - 126 U/L   Total Bilirubin 0.3 0.3 - 1.2 mg/dL   GFR calc non Af Amer >60 >60 mL/min   GFR calc Af Amer >60 >60 mL/min   Anion gap 11 5 - 15    Comment: Performed at [facility], [address] [place], [place] [address]    I have reviewed the patient's current medications.  ASSESSMENT: Active Problems:   Antepartum mild preeclampsia, third trimester   PLAN: Blood pressure currently normal P/C ratio noted to be elevated, however due to previous BP and consistent headache, will still consider potential IOL at 37 weeks   Continue routine antenatal care.   Mariel Aloe, MD [facility] Faculty Attending, Center for [facility] 03/19/2020 8:28 AM

## 2020-03-19 NOTE — Plan of Care (Signed)
  Problem: Education: Goal: Knowledge of General Education information will improve Description: Including pain rating scale, medication(s)/side effects and non-pharmacologic comfort measures Outcome: Completed/Met

## 2020-03-20 ENCOUNTER — Encounter (HOSPITAL_COMMUNITY): Payer: Self-pay | Admitting: Obstetrics and Gynecology

## 2020-03-20 DIAGNOSIS — O1403 Mild to moderate pre-eclampsia, third trimester: Secondary | ICD-10-CM | POA: Diagnosis not present

## 2020-03-20 DIAGNOSIS — Z3A36 36 weeks gestation of pregnancy: Secondary | ICD-10-CM | POA: Diagnosis not present

## 2020-03-20 LAB — CBC
HCT: 33.2 % — ABNORMAL LOW (ref 36.0–46.0)
Hemoglobin: 10.3 g/dL — ABNORMAL LOW (ref 12.0–15.0)
MCH: 25.9 pg — ABNORMAL LOW (ref 26.0–34.0)
MCHC: 31 g/dL (ref 30.0–36.0)
MCV: 83.6 fL (ref 80.0–100.0)
Platelets: 266 10*3/uL (ref 150–400)
RBC: 3.97 MIL/uL (ref 3.87–5.11)
RDW: 14.6 % (ref 11.5–15.5)
WBC: 8.9 10*3/uL (ref 4.0–10.5)
nRBC: 0 % (ref 0.0–0.2)

## 2020-03-20 MED ORDER — OXYTOCIN-SODIUM CHLORIDE 30-0.9 UT/500ML-% IV SOLN
1.0000 m[IU]/min | INTRAVENOUS | Status: DC
  Administered 2020-03-20: 2 m[IU]/min via INTRAVENOUS

## 2020-03-20 MED ORDER — OXYCODONE-ACETAMINOPHEN 5-325 MG PO TABS: 1.0000 | ORAL_TABLET | Freq: Once | ORAL | Status: DC

## 2020-03-20 MED ORDER — MISOPROSTOL 25 MCG QUARTER TABLET
25.0000 ug | ORAL_TABLET | ORAL | Status: DC | PRN
  Administered 2020-03-20 (×2): 25 ug via VAGINAL
  Filled 2020-03-20 (×2): qty 1

## 2020-03-20 MED ORDER — DIPHENHYDRAMINE HCL 50 MG/ML IJ SOLN: 12.5000 mg | INTRAMUSCULAR | Status: DC | PRN

## 2020-03-20 MED ORDER — LACTATED RINGERS IV SOLN: INTRAVENOUS | Status: DC

## 2020-03-20 MED ORDER — FENTANYL CITRATE (PF) 100 MCG/2ML IJ SOLN
50.0000 ug | INTRAMUSCULAR | Status: DC | PRN
  Administered 2020-03-20 – 2020-03-21 (×7): 100 ug via INTRAVENOUS
  Filled 2020-03-20 (×7): qty 2

## 2020-03-20 MED ORDER — LACTATED RINGERS IV SOLN: 500.0000 mL | Freq: Once | INTRAVENOUS | Status: DC

## 2020-03-20 MED ORDER — SOD CITRATE-CITRIC ACID 500-334 MG/5ML PO SOLN: 30.0000 mL | ORAL | Status: DC | PRN

## 2020-03-20 MED ORDER — OXYTOCIN BOLUS FROM INFUSION
333.0000 mL | Freq: Once | INTRAVENOUS | Status: AC
  Administered 2020-03-21: 333 mL via INTRAVENOUS

## 2020-03-20 MED ORDER — LACTATED RINGERS IV SOLN
500.0000 mL | INTRAVENOUS | Status: DC | PRN
  Administered 2020-03-21 (×2): 1000 mL via INTRAVENOUS

## 2020-03-20 MED ORDER — LIDOCAINE HCL (PF) 1 % IJ SOLN: 30.0000 mL | INTRAMUSCULAR | Status: DC | PRN

## 2020-03-20 MED ORDER — FENTANYL-BUPIVACAINE-NACL 0.5-0.125-0.9 MG/250ML-% EP SOLN
12.0000 mL/h | EPIDURAL | Status: DC | PRN
  Filled 2020-03-20: qty 250

## 2020-03-20 MED ORDER — PHENYLEPHRINE 40 MCG/ML (10ML) SYRINGE FOR IV PUSH (FOR BLOOD PRESSURE SUPPORT)
80.0000 ug | PREFILLED_SYRINGE | INTRAVENOUS | Status: DC | PRN
  Filled 2020-03-20: qty 10

## 2020-03-20 MED ORDER — TERBUTALINE SULFATE 1 MG/ML IJ SOLN: 0.2500 mg | Freq: Once | INTRAMUSCULAR | Status: DC | PRN

## 2020-03-20 MED ORDER — ACETAMINOPHEN 325 MG PO TABS
650.0000 mg | ORAL_TABLET | ORAL | Status: DC | PRN
  Administered 2020-03-21: 650 mg via ORAL
  Filled 2020-03-20: qty 2

## 2020-03-20 MED ORDER — EPHEDRINE 5 MG/ML INJ: 10.0000 mg | INTRAVENOUS | Status: DC | PRN

## 2020-03-20 MED ORDER — ONDANSETRON HCL 4 MG/2ML IJ SOLN: 4.0000 mg | Freq: Four times a day (QID) | INTRAMUSCULAR | Status: DC | PRN

## 2020-03-20 MED ORDER — PHENYLEPHRINE 40 MCG/ML (10ML) SYRINGE FOR IV PUSH (FOR BLOOD PRESSURE SUPPORT)
80.0000 ug | PREFILLED_SYRINGE | INTRAVENOUS | Status: DC | PRN
  Administered 2020-03-21 (×2): 80 ug via INTRAVENOUS

## 2020-03-20 MED ORDER — OXYTOCIN-SODIUM CHLORIDE 30-0.9 UT/500ML-% IV SOLN: 2.5000 [IU]/h | INTRAVENOUS | Status: DC

## 2020-03-20 MED ORDER — OXYTOCIN-SODIUM CHLORIDE 30-0.9 UT/500ML-% IV SOLN
1.0000 m[IU]/min | INTRAVENOUS | Status: DC
  Filled 2020-03-20: qty 500

## 2020-03-20 MED ORDER — OXYCODONE-ACETAMINOPHEN 5-325 MG PO TABS
1.0000 | ORAL_TABLET | ORAL | Status: DC | PRN
  Administered 2020-03-20: 1 via ORAL
  Filled 2020-03-20: qty 1

## 2020-03-20 MED ORDER — HYDROXYZINE HCL 50 MG PO TABS: 50.0000 mg | ORAL_TABLET | Freq: Four times a day (QID) | ORAL | Status: DC | PRN

## 2020-03-20 NOTE — Progress Notes (Signed)
Labor Progress Note Nicole Schaefer is a 19 y.o. G1P0 at [redacted]w[redacted]d presented for IOL for pre-eclampsia with severe features (headache).   S: Currently sleeping, history obtained from nurse. Foley bulb out 1446. Headache continues.   O:  BP 130/78   Pulse 76   Temp 98.6 F (37 C)   Resp 18   Ht 5\' 3"  (1.6 m)   Wt 105.2 kg Comment: per pt  LMP 06/11/2019   SpO2 98%   BMI 41.10 kg/m  EFM: HR 125 bpm / moderate variability / accels present, decels absent  CVE: Dilation: 4 Effacement (%): 50 Station: -3 Presentation: Vertex Exam by:: lee   A&P: 19 y.o. G1P0 [redacted]w[redacted]d presented for IOL for pre-eclampsia with severe features (headache).   #Labor: Progressing well. FB placed 1250, out 1446 . S/p misoprostol x2, last at 1456.Will re-evaluate need for pitocin around 1900.  #Pre-eclampsia: Continued headache reported to nurse, percoset PRN added. Pre-E labs normal. BP has ranged (119-152)/(65-95), highest 152/95.  #Pain: PO tylenol, PO percocet, IV fentanyl PRN #FWB: Cat 1 #GBS negative   [redacted]w[redacted]d, MD 3:51 PM

## 2020-03-20 NOTE — Progress Notes (Signed)
Labor Progress Note Nicole Schaefer is a 19 y.o. G1P0 at [redacted]w[redacted]d presented for IOL for pre-eclampsia with severe features (headache).   S: Feeling regular, painful contractions. Considering getting an epidural, but expresses anxiety about procedure.  O:  BP 122/75   Pulse 95   Temp 98.1 F (36.7 C) (Oral)   Resp 16   Ht 5\' 3"  (1.6 m)   Wt 105.2 kg Comment: per pt  LMP 06/11/2019   SpO2 98%   BMI 41.10 kg/m  EFM: HR 130 bpm / moderate variability / 15x15 accels present, no decelerations Tocometer: not detected on toco  CVE: Dilation: 6 Effacement (%): 80 Station: -1 Presentation: Vertex Exam by:: Dr. [email or web address]   A&P: 19 y.o. G1P0 [redacted]w[redacted]d presented for IOL for pre-eclampsia with severe features (headache).   #Labor: Progressing well. FB placed 1250, out 1446 . S/p misoprostol x2, last at 1456.Pitcoin started 1900 with 2x2 titration. Consider AROM. #Pre-eclampsia without severe features: Headache resolved. Pre-E labs without severe features, urine protein creatinine ratio 0.42. Pressures normalized. #Pain: PO tylenol, PO percocet, IV fentanyl PRN #FWB: Cat I, reassuring #GBS negative  #Anemia of pregnancy- Hgb 8.3 this morning, on repeat 10.3, consider feraheme postpartum  [redacted]w[redacted]d, MD PGY1 Center for [facility], [facility] 03/20/2020 11:13 PM

## 2020-03-20 NOTE — Progress Notes (Signed)
FACULTY PRACTICE ANTEPARTUM PROGRESS NOTE  Nicole Schaefer is a 19 y.o. G1P0 at [redacted]w[redacted]d who is admitted for headache and gestational hypertension/mild preeclampsia.  Estimated Date of Delivery: 04/10/20 Fetal presentation is cephalic.  Length of Stay:  2 Days. Admitted 03/18/2020  Subjective: Pt seen this morning.  She has continued to have intermittent mild range elevated blood pressure.  She continues to have a headache which she rates 8/10, but at the same time she has had chronic headaches throughout the pregnancy.  She denies visual changes and RUQ pain. Patient reports gnormal fetal movement.  She denies uterine contractions, denies bleeding and leaking of fluid per vagina.  Vitals:  Blood pressure 119/67, pulse 90, temperature 98.6 F (37 C), resp. rate 18, height 5\' 3"  (1.6 m), weight 105.2 kg, last menstrual period 06/11/2019, SpO2 98 %, unknown if currently breastfeeding. Physical Examination: CONSTITUTIONAL: Well-developed, well-nourished female in no acute distress.  HENT:  Normocephalic, atraumatic, External right and left ear normal. Oropharynx is clear and moist EYES: Conjunctivae and EOM are normal. Pupils are equal, round, and reactive to light. No scleral icterus.  NECK: Normal range of motion, supple, no masses. SKIN: Skin is warm and dry. No rash noted. Not diaphoretic. No erythema. No pallor. NEUROLGIC: Alert and oriented to person, place, and time. Normal reflexes, muscle tone coordination. No cranial nerve deficit noted. PSYCHIATRIC: Normal mood and affect. Normal behavior. Normal judgment and thought content. CARDIOVASCULAR: Normal heart rate noted, regular rhythm RESPIRATORY: Effort and breath sounds normal, no problems with respiration noted MUSCULOSKELETAL: Normal range of motion. No edema and no tenderness. ABDOMEN: Soft, nontender, nondistended, gravid. CERVIX: deferred Vertex position confirmed with bedside u/s.  Fetal monitoring: FHR: 120 bpm, Variability:  moderate, Accelerations: Present, Decelerations: Absent  Uterine activity: irritability  Results for orders placed or performed during the hospital encounter of 03/18/20 (from the past 48 hour(s))  Urinalysis, Routine w reflex microscopic Urine, Clean Catch     Status: Abnormal   Collection Time: 03/18/20  4:17 PM  Result Value Ref Range   Color, Urine YELLOW YELLOW   APPearance CLEAR CLEAR   Specific Gravity, Urine 1.010 1.005 - 1.030   pH 6.0 5.0 - 8.0   Glucose, UA NEGATIVE NEGATIVE mg/dL   Hgb urine dipstick NEGATIVE NEGATIVE   Bilirubin Urine NEGATIVE NEGATIVE   Ketones, ur NEGATIVE NEGATIVE mg/dL   Protein, ur 30 (A) NEGATIVE mg/dL   Nitrite NEGATIVE NEGATIVE   Leukocytes,Ua NEGATIVE NEGATIVE   RBC / HPF 0-5 0 - 5 RBC/hpf   WBC, UA 0-5 0 - 5 WBC/hpf   Bacteria, UA NONE SEEN NONE SEEN   Squamous Epithelial / LPF 0-5 0 - 5    Comment: Performed at [facility], [address] [place], [place]  Protein / creatinine ratio, urine     Status: Abnormal   Collection Time: 03/18/20  4:17 PM  Result Value Ref Range   Creatinine, Urine 110.51 mg/dL   Total Protein, Urine 46 mg/dL    Comment: NO NORMAL RANGE ESTABLISHED FOR THIS TEST   Protein Creatinine Ratio 0.42 (H) 0.00 - 0.15 mg/mg[Cre]    Comment: Performed at [facility], [address] [place], [place]  CBC     Status: Abnormal   Collection Time: 03/18/20  5:53 PM  Result Value Ref Range   WBC 10.6 (H) 4.0 - 10.5 K/uL   RBC 3.64 (L) 3.87 - 5.11 MIL/uL   Hemoglobin 9.4 (L) 12.0 - 15.0 g/dL  HCT 29.7 (L) 36 - 46 %   MCV 81.6 80.0 - 100.0 fL   MCH 25.8 (L) 26.0 - 34.0 pg   MCHC 31.6 30.0 - 36.0 g/dL   RDW 16.1 09.6 - 04.5 %   Platelets 260 150 - 400 K/uL   nRBC 0.0 0.0 - 0.2 %    Comment: Performed at [facility], [address], [place], [place] [address]  Comprehensive metabolic panel     Status: Abnormal   Collection Time: 03/18/20  5:53 PM  Result Value Ref Range   Sodium  136 135 - 145 mmol/L   Potassium 3.4 (L) 3.5 - 5.1 mmol/L   Chloride 107 98 - 111 mmol/L   CO2 20 (L) 22 - 32 mmol/L   Glucose, Bld 75 70 - 99 mg/dL    Comment: Glucose reference range applies only to samples taken after fasting for at least 8 hours.   BUN 5 (L) 6 - 20 mg/dL   Creatinine, Ser 1.91 0.44 - 1.00 mg/dL   Calcium 8.9 8.9 - 47.8 mg/dL   Total Protein 6.5 6.5 - 8.1 g/dL   Albumin 2.9 (L) 3.5 - 5.0 g/dL   AST 36 15 - 41 U/L   ALT 27 0 - 44 U/L   Alkaline Phosphatase 116 38 - 126 U/L   Total Bilirubin 0.4 0.3 - 1.2 mg/dL   GFR calc non Af Amer >60 >60 mL/min   GFR calc Af Amer >60 >60 mL/min   Anion gap 9 5 - 15    Comment: Performed at [facility], [address] [place], [place] [address]  Type and screen [facility]     Status: None   Collection Time: 03/18/20  6:04 PM  Result Value Ref Range   ABO/RH(D) B POS    Antibody Screen NEG    Sample Expiration      03/21/2020,2359 Performed at [facility], [address], [place], [place] [address]   Respiratory Panel by RT PCR (Flu A&B, Covid) - Nasopharyngeal Swab     Status: None   Collection Time: 03/18/20  9:36 PM   Specimen: Nasopharyngeal Swab  Result Value Ref Range   SARS Coronavirus 2 by RT PCR NEGATIVE NEGATIVE    Comment: (NOTE) SARS-CoV-2 target nucleic acids are NOT DETECTED.  The SARS-CoV-2 RNA is generally detectable in upper respiratoy specimens during the acute phase of infection. The lowest concentration of SARS-CoV-2 viral copies this assay can detect is 131 copies/mL. A negative result does not preclude SARS-Cov-2 infection and should not be used as the sole basis for treatment or other patient management decisions. A negative result may occur with  improper specimen collection/handling, submission of specimen other than nasopharyngeal swab, presence of viral mutation(s) within the areas targeted by this assay, and inadequate number of viral copies (<131 copies/mL).  A negative result must be combined with clinical observations, patient history, and epidemiological information. The expected result is Negative.  Fact Sheet for Patients:  https://www.moore.com/  Fact Sheet for Healthcare Providers:  https://www.young.biz/  This test is no t yet approved or cleared by the [place] FDA and  has been authorized for detection and/or diagnosis of SARS-CoV-2 by FDA under an Emergency Use Authorization (EUA). This EUA will remain  in effect (meaning this test can be used) for the duration of the COVID-19 declaration under Section 564(b)(1) of the Act, 21 U.S.C. section 360bbb-3(b)(1), unless the authorization is terminated or revoked sooner.  Influenza A by PCR NEGATIVE NEGATIVE   Influenza B by PCR NEGATIVE NEGATIVE    Comment: (NOTE) The Xpert Xpress SARS-CoV-2/FLU/RSV assay is intended as an aid in  the diagnosis of influenza from Nasopharyngeal swab specimens and  should not be used as a sole basis for treatment. Nasal washings and  aspirates are unacceptable for Xpert Xpress SARS-CoV-2/FLU/RSV  testing.  Fact Sheet for Patients: https://www.moore.com/https://www.fda.gov/media/142436/download  Fact Sheet for Healthcare Providers: https://www.young.biz/https://www.fda.gov/media/142435/download  This test is not yet approved or cleared by the [place] FDA and  has been authorized for detection and/or diagnosis of SARS-CoV-2 by  FDA under an Emergency Use Authorization (EUA). This EUA will remain  in effect (meaning this test can be used) for the duration of the  Covid-19 declaration under Section 564(b)(1) of the Act, 21  U.S.C. section 360bbb-3(b)(1), unless the authorization is  terminated or revoked. Performed at [facility], [address] [place], [place] [address]   CBC on admission     Status: Abnormal   Collection Time: 03/19/20  5:16 AM  Result Value Ref Range   WBC 8.3 4.0 - 10.5 K/uL   RBC 3.21 (L) 3.87 - 5.11  MIL/uL   Hemoglobin 8.3 (L) 12.0 - 15.0 g/dL   HCT 60.426.5 (L) 36 - 46 %   MCV 82.6 80.0 - 100.0 fL   MCH 25.9 (L) 26.0 - 34.0 pg   MCHC 31.3 30.0 - 36.0 g/dL   RDW 54.014.5 98.111.5 - 19.115.5 %   Platelets 223 150 - 400 K/uL   nRBC 0.0 0.0 - 0.2 %    Comment: Performed at [facility], [address] [place], [place] [address]  Comprehensive metabolic panel     Status: Abnormal   Collection Time: 03/19/20  5:16 AM  Result Value Ref Range   Sodium 137 135 - 145 mmol/L   Potassium 3.4 (L) 3.5 - 5.1 mmol/L   Chloride 107 98 - 111 mmol/L   CO2 19 (L) 22 - 32 mmol/L   Glucose, Bld 101 (H) 70 - 99 mg/dL    Comment: Glucose reference range applies only to samples taken after fasting for at least 8 hours.   BUN 7 6 - 20 mg/dL   Creatinine, Ser 5.620.72 0.44 - 1.00 mg/dL   Calcium 8.3 (L) 8.9 - 10.3 mg/dL   Total Protein 5.4 (L) 6.5 - 8.1 g/dL   Albumin 2.4 (L) 3.5 - 5.0 g/dL   AST 28 15 - 41 U/L   ALT 22 0 - 44 U/L   Alkaline Phosphatase 99 38 - 126 U/L   Total Bilirubin 0.3 0.3 - 1.2 mg/dL   GFR calc non Af Amer >60 >60 mL/min   GFR calc Af Amer >60 >60 mL/min   Anion gap 11 5 - 15    Comment: Performed at [facility], [address] [place], [place] [address]    I have reviewed the patient's current medications.  ASSESSMENT: Active Problems:   Antepartum mild preeclampsia, third trimester   PLAN: Pt is now 37 weeks.  She has had two elevated P/C ratios, intermittent hypertension and persistent headache,  Due to these factors she will be transferred to labor and delivery for IOL.  Pt is aware of the plan and agrees.  Discussed with patient this may be a prolonged experience and if there is fetal distress or failure to progress she may be transitioned to cesarean section.    Mariel AloeLawrence Chevie Birkhead, MD [facility] Faculty  Attending, Center for [facility] 03/20/2020 9:00 AM

## 2020-03-20 NOTE — Progress Notes (Signed)
Nicole Schaefer is a 19 y.o. G1P0 at [redacted]w[redacted]d by ultrasound admitted for induction of labor due to pre-eclampsia.  Subjective: Nicole Schaefer is doing well. Headache in the center of her forehead rated 9/10. No cramps, just some pressure in pelvis. Good fetal movement.   Objective: BP 136/90   Pulse 90   Temp 98.6 F (37 C)   Resp 18   Ht 5\' 3"  (1.6 m)   Wt 105.2 kg Comment: per pt  LMP 06/11/2019   SpO2 98%   BMI 41.10 kg/m  No intake/output data recorded. No intake/output data recorded.  FHT:  FHR: 140 bpm, variability: moderate,  accelerations:  Present,  decelerations:  Absent UC:   regular, every 5 minutes SVE:    1/60%/-2  Labs: Lab Results  Component Value Date   WBC 8.9 03/20/2020   HGB 10.3 (L) 03/20/2020   HCT 33.2 (L) 03/20/2020   MCV 83.6 03/20/2020   PLT 266 03/20/2020    Assessment / Plan: IOL d/t pre-eclampsia currently doing well. Induction of labor with FB placement and vaginal cytotec.   Labor: s/p FB placement and vaginal cytotec x1 Preeclampsia:  no signs or symptoms of toxicity, intake and ouput balanced and labs stable Fetal Wellbeing:  Category I Pain Control:  Labor support without medications I/D:  n/a Anticipated MOD:  NSVD  05/20/2020 03/20/2020, 11:14 AM

## 2020-03-20 NOTE — Progress Notes (Addendum)
Labor Progress Note Nicole Schaefer is a 19 y.o. G1P0 at [redacted]w[redacted]d presented for IOL for pre-eclampsia with severe features (headache).   S: Currently resting in bed, sleepy. No headache currently, resolved with medications. No other pre-E symptoms.   O:  BP 125/70   Pulse 93   Temp 98.6 F (37 C) (Oral)   Resp 16   Ht 5\' 3"  (1.6 m)   Wt 105.2 kg Comment: per pt  LMP 06/11/2019   SpO2 98%   BMI 41.10 kg/m  EFM: HR 130-135 bpm / moderate variability / 15x15 accels present, no decelerations Tocometer: contracting q2-4 minutes, not uncomfortable in the room  CVE: Dilation: 5 Effacement (%): 80 Station: -2 Presentation: Vertex Exam by:: lee   A&P: 19 y.o. G1P0 [redacted]w[redacted]d presented for IOL for pre-eclampsia with severe features (headache).   #Labor: Progressing well. FB placed 1250, out 1446 . S/p misoprostol x2, last at 1456.Pitcoin started 1900 with 2x2 titration. Consider AROM if establishes regular contraction pattern. #Pre-eclampsia without severe features: Headache now resolved, percocet PRN. Pre-E labs without severe features, urine protein creatinine ratio 0.42. BP has ranged (118-152)/(65-95), highest single 152/95 @ 1245.  #Pain: PO tylenol, PO percocet, IV fentanyl PRN #FWB: Cat I, reassuring #GBS negative  #Asthma- no regular inhaler use #Obesity in pregnancy- BMI 41 #Anemia of pregnancy- Hgb 8.3 this morning, on repeat 10.3, consider feraheme postpartum   [redacted]w[redacted]d, MD 7:17 PM   Attestation of Supervision of Student:  I confirm that I have verified the information documented in the resident's note. I have verified that all services and findings are accurately documented in this note; and I agree with management and plan as outlined in the documentation. I have also made any necessary editorial changes.  Fayette Pho, MD [facility], [facility] 03/20/2020 7:23 PM

## 2020-03-21 ENCOUNTER — Encounter: Payer: Medicaid Other | Admitting: Advanced Practice Midwife

## 2020-03-21 ENCOUNTER — Inpatient Hospital Stay (HOSPITAL_COMMUNITY): Payer: Medicaid Other | Admitting: Anesthesiology

## 2020-03-21 ENCOUNTER — Encounter (HOSPITAL_COMMUNITY): Payer: Self-pay | Admitting: Obstetrics and Gynecology

## 2020-03-21 DIAGNOSIS — Z3A37 37 weeks gestation of pregnancy: Secondary | ICD-10-CM

## 2020-03-21 DIAGNOSIS — O1494 Unspecified pre-eclampsia, complicating childbirth: Secondary | ICD-10-CM

## 2020-03-21 LAB — CBC
HCT: 32.4 % — ABNORMAL LOW (ref 36.0–46.0)
HCT: 27.1 % — ABNORMAL LOW (ref 36.0–46.0)
Hemoglobin: 10.3 g/dL — ABNORMAL LOW (ref 12.0–15.0)
Hemoglobin: 8.4 g/dL — ABNORMAL LOW (ref 12.0–15.0)
MCH: 26 pg (ref 26.0–34.0)
MCH: 25.8 pg — ABNORMAL LOW (ref 26.0–34.0)
MCHC: 31.8 g/dL (ref 30.0–36.0)
MCHC: 31 g/dL (ref 30.0–36.0)
MCV: 81.8 fL (ref 80.0–100.0)
MCV: 83.1 fL (ref 80.0–100.0)
Platelets: 279 10*3/uL (ref 150–400)
Platelets: 227 10*3/uL (ref 150–400)
RBC: 3.96 MIL/uL (ref 3.87–5.11)
RBC: 3.26 MIL/uL — ABNORMAL LOW (ref 3.87–5.11)
RDW: 14.7 % (ref 11.5–15.5)
RDW: 14.7 % (ref 11.5–15.5)
WBC: 13.5 10*3/uL — ABNORMAL HIGH (ref 4.0–10.5)
WBC: 14.6 10*3/uL — ABNORMAL HIGH (ref 4.0–10.5)
nRBC: 0 % (ref 0.0–0.2)
nRBC: 0 % (ref 0.0–0.2)

## 2020-03-21 LAB — RPR: RPR Ser Ql: NONREACTIVE

## 2020-03-21 MED ORDER — PRENATAL MULTIVITAMIN CH
1.0000 | ORAL_TABLET | Freq: Every day | ORAL | Status: DC
  Administered 2020-03-21 – 2020-03-22 (×2): 1 via ORAL
  Filled 2020-03-21 (×2): qty 1

## 2020-03-21 MED ORDER — ACETAMINOPHEN 325 MG PO TABS: 650.0000 mg | ORAL_TABLET | ORAL | Status: DC | PRN

## 2020-03-21 MED ORDER — MISOPROSTOL 200 MCG PO TABS
800.0000 ug | ORAL_TABLET | Freq: Once | ORAL | Status: AC
  Administered 2020-03-21: 800 ug via RECTAL

## 2020-03-21 MED ORDER — COCONUT OIL OIL: 1.0000 "application " | TOPICAL_OIL | Status: DC | PRN

## 2020-03-21 MED ORDER — SENNOSIDES-DOCUSATE SODIUM 8.6-50 MG PO TABS
2.0000 | ORAL_TABLET | ORAL | Status: DC
  Administered 2020-03-21: 2 via ORAL
  Filled 2020-03-21: qty 2

## 2020-03-21 MED ORDER — DIBUCAINE (PERIANAL) 1 % EX OINT: 1.0000 "application " | TOPICAL_OINTMENT | CUTANEOUS | Status: DC | PRN

## 2020-03-21 MED ORDER — MISOPROSTOL 200 MCG PO TABS
ORAL_TABLET | ORAL | Status: AC
  Filled 2020-03-21: qty 4

## 2020-03-21 MED ORDER — TETANUS-DIPHTH-ACELL PERTUSSIS 5-2.5-18.5 LF-MCG/0.5 IM SUSP: 0.5000 mL | Freq: Once | INTRAMUSCULAR | Status: DC

## 2020-03-21 MED ORDER — ONDANSETRON HCL 4 MG/2ML IJ SOLN: 4.0000 mg | INTRAMUSCULAR | Status: DC | PRN

## 2020-03-21 MED ORDER — LIDOCAINE-EPINEPHRINE (PF) 2 %-1:200000 IJ SOLN
INTRAMUSCULAR | Status: DC | PRN
  Administered 2020-03-21 (×2): 3 mL via EPIDURAL

## 2020-03-21 MED ORDER — IBUPROFEN 600 MG PO TABS
600.0000 mg | ORAL_TABLET | Freq: Four times a day (QID) | ORAL | Status: DC
  Administered 2020-03-21 – 2020-03-22 (×5): 600 mg via ORAL
  Filled 2020-03-21 (×5): qty 1

## 2020-03-21 MED ORDER — ONDANSETRON HCL 4 MG PO TABS: 4.0000 mg | ORAL_TABLET | ORAL | Status: DC | PRN

## 2020-03-21 MED ORDER — SIMETHICONE 80 MG PO CHEW: 80.0000 mg | CHEWABLE_TABLET | ORAL | Status: DC | PRN

## 2020-03-21 MED ORDER — WITCH HAZEL-GLYCERIN EX PADS: 1.0000 "application " | MEDICATED_PAD | CUTANEOUS | Status: DC | PRN

## 2020-03-21 MED ORDER — BUPIVACAINE HCL (PF) 0.75 % IJ SOLN
INTRAMUSCULAR | Status: DC | PRN
Start: 2020-03-21 — End: 2020-03-21
  Administered 2020-03-21: 12 mL/h via EPIDURAL

## 2020-03-21 MED ORDER — MEASLES, MUMPS & RUBELLA VAC IJ SOLR: 0.5000 mL | Freq: Once | INTRAMUSCULAR | Status: DC

## 2020-03-21 MED ORDER — OXYCODONE HCL 5 MG PO TABS: 5.0000 mg | ORAL_TABLET | ORAL | Status: DC | PRN

## 2020-03-21 MED ORDER — BENZOCAINE-MENTHOL 20-0.5 % EX AERO
1.0000 "application " | INHALATION_SPRAY | CUTANEOUS | Status: DC | PRN
  Administered 2020-03-21: 1 via TOPICAL
  Filled 2020-03-21: qty 56

## 2020-03-21 MED ORDER — ZOLPIDEM TARTRATE 5 MG PO TABS: 5.0000 mg | ORAL_TABLET | Freq: Every evening | ORAL | Status: DC | PRN

## 2020-03-21 MED ORDER — DIPHENHYDRAMINE HCL 25 MG PO CAPS: 25.0000 mg | ORAL_CAPSULE | Freq: Four times a day (QID) | ORAL | Status: DC | PRN

## 2020-03-21 NOTE — Anesthesia Procedure Notes (Signed)
Epidural Patient location during procedure: OB Start time: 03/21/2020 3:37 AM End time: 03/21/2020 3:47 AM  Staffing Anesthesiologist: Elmer Picker, MD Performed: anesthesiologist   Preanesthetic Checklist Completed: patient identified, IV checked, risks and benefits discussed, monitors and equipment checked, pre-op evaluation and timeout performed  Epidural Patient position: sitting Prep: DuraPrep and site prepped and draped Patient monitoring: continuous pulse ox, blood pressure, heart rate and cardiac monitor Approach: midline Location: L3-L4 Injection technique: LOR air  Needle:  Needle type: Tuohy  Needle gauge: 17 G Needle length: 9 cm Needle insertion depth: 7 cm Catheter type: closed end flexible Catheter size: 19 Gauge Catheter at skin depth: 13 cm Test dose: negative  Assessment Sensory level: T8 Events: blood not aspirated, injection not painful, no injection resistance, no paresthesia and negative IV test  Additional Notes Patient identified. Risks/Benefits/Options discussed with patient including but not limited to bleeding, infection, nerve damage, paralysis, failed block, incomplete pain control, headache, blood pressure changes, nausea, vomiting, reactions to medication both or allergic, itching and postpartum back pain. Confirmed with bedside nurse the patient's most recent platelet count. Confirmed with patient that they are not currently taking any anticoagulation, have any bleeding history or any family history of bleeding disorders. Patient expressed understanding and wished to proceed. All questions were answered. Sterile technique was used throughout the entire procedure. Please see nursing notes for vital signs. Test dose was given through epidural catheter and negative prior to continuing to dose epidural or start infusion. Warning signs of high block given to the patient including shortness of breath, tingling/numbness in hands, complete motor block,  or any concerning symptoms with instructions to call for help. Patient was given instructions on fall risk and not to get out of bed. All questions and concerns addressed with instructions to call with any issues or inadequate analgesia.  Reason for block:procedure for pain

## 2020-03-21 NOTE — Discharge Summary (Addendum)
Postpartum Discharge Summary  Date of Service updated 03/22/20     Patient Name: Nicole Schaefer DOB: August 06, 2000 MRN: [id number]  Date of admission: 03/18/2020 Delivery date:03/21/2020  Delivering provider: Christin Fudge  Date of discharge: 03/22/2020  Admitting diagnosis: Antepartum mild preeclampsia, third trimester [O14.03] Intrauterine pregnancy: [redacted]w[redacted]d    Secondary diagnosis:  Active Problems:   Supervision of normal first teen pregnancy   Exercise induced bronchospasm   Antepartum mild preeclampsia, third trimester   Vaginal delivery  Additional problems: none    Discharge diagnosis: Term Pregnancy Delivered, Preeclampsia (mild) and Anemia                                              Post partum procedures:none Augmentation: AROM, Pitocin, Cytotec and IP Foley Complications: None  Hospital course: Induction of Labor With Vaginal Delivery   19y.o. yo G1P0 at 371w1das admitted to the hospital 03/18/2020 for induction of labor.  Indication for induction: Preeclampsia.  Patient had an uncomplicated labor course as follows: Membrane Rupture Time/Date: 1:15 AM ,03/21/2020   Delivery Method:Vaginal, Spontaneous  Episiotomy: None  Lacerations:  1st degree;Labial  Details of delivery can be found in separate delivery note.  Patient had a routine postpartum course. Patient is discharged home 03/22/20.  Newborn Data: Birth date:03/21/2020  Birth time:8:14 AM  Gender:Female  Living status:Living  Apgars:9 ,9  Weight:2925 g   Magnesium Sulfate received: No BMZ received: No Rhophylac:N/A MMR:N/A T-DaP:declined Flu: Yes Transfusion:No  Physical exam  Vitals:   03/21/20 1530 03/21/20 2005 03/21/20 2316 03/22/20 0557  BP: 133/77 123/76 (!) 141/94 117/80  Pulse: 82 79 76 93  Resp: _0 Temp: 99 F (37.2 C) 99.1 F (37.3 C) 98.8 F (37.1 C) 98.9 F (37.2 C)  TempSrc:  Oral Oral Oral  SpO2: 99% 99%    Weight:      Height:       General: alert,  cooperative and no distress Lochia: appropriate Uterine Fundus: firm Incision: N/A DVT Evaluation: No evidence of DVT seen on physical exam. Labs: Lab Results  Component Value Date   WBC 14.6 (H) 03/21/2020   HGB 8.4 (L) 03/21/2020   HCT 27.1 (L) 03/21/2020   MCV 83.1 03/21/2020   PLT 227 03/21/2020   CMP Latest Ref Rng & Units 03/19/2020  Glucose 70 - 99 mg/dL 101(H)  BUN 6 - 20 mg/dL 7  Creatinine 0.44 - 1.00 mg/dL 0.72  Sodium 135 - 145 mmol/L 137  Potassium 3.5 - 5.1 mmol/L 3.4(L)  Chloride 98 - 111 mmol/L 107  CO2 22 - 32 mmol/L 19(L)  Calcium 8.9 - 10.3 mg/dL 8.3(L)  Total Protein 6.5 - 8.1 g/dL 5.4(L)  Total Bilirubin 0.3 - 1.2 mg/dL 0.3  Alkaline Phos 38 - 126 U/L 99  AST 15 - 41 U/L 28  ALT 0 - 44 U/L 22   Edinburgh Score: No flowsheet data found.   After visit meds:  Allergies as of 03/22/2020      Reactions   Pollen Extract    Runny nose      Medication List    STOP taking these medications   aspirin EC 81 MG tablet   Comfort Fit Maternity Supp Lg Misc   cyclobenzaprine 10 MG tablet Commonly known as: FLEXERIL   Nebulizer Misc   prenatal multivitamin Tabs tablet  VITAMIN D3 PO     TAKE these medications   acetaminophen 325 MG tablet Commonly known as: Tylenol Take 2 tablets (650 mg total) by mouth every 4 (four) hours as needed (for pain scale < 4).   albuterol 108 (90 Base) MCG/ACT inhaler Commonly known as: VENTOLIN HFA Inhale into the lungs every 6 (six) hours as needed.   ascorbic acid 250 MG tablet Commonly known as: VITAMIN C Take 1 tablet (250 mg total) by mouth every other day. Take with iron.   Blood Pressure Monitor Kit 1 Device by Does not apply route once a week. To be monitored Regularly at home.   ferrous sulfate 325 (65 FE) MG tablet Take 1 tablet (325 mg total) by mouth every other day.   fluticasone 44 MCG/ACT inhaler Commonly known as: FLOVENT HFA Inhale into the lungs 2 (two) times daily.   ibuprofen 600 MG  tablet Commonly known as: ADVIL Take 1 tablet (600 mg total) by mouth every 6 (six) hours.   loratadine 10 MG tablet Commonly known as: CLARITIN Take 10 mg by mouth daily.   Mometasone Furo-Formoterol Fum 50-5 MCG/ACT Aero Inhale into the lungs.        Discharge home in stable condition Infant Feeding: Bottle Infant Disposition:home with mother Discharge instruction: per After Visit Summary and Postpartum booklet. Activity: Advance as tolerated. Pelvic rest for 6 weeks.  Diet: routine diet Future Appointments: Future Appointments  Date Time Provider [place]  03/24/2020  8:50 AM Shamleffer, Melanie Crazier, MD [facility] None  04/07/2020 10:15 AM [place] Bobbye Morton, PA-C [facility]   Follow up Visit:   Please schedule this patient for a Virtual postpartum visit in 4 weeks with the following provider: Any provider. Additional Postpartum F/U:Postpartum Depression checkup and BP check 1 week, patient noted to have flat affect on exam  High risk pregnancy complicated by: HTN Delivery mode:  Vaginal, Spontaneous  Anticipated Birth Control:  Unsure   91/09/7827 Arrie Senate, MD

## 2020-03-21 NOTE — Anesthesia Postprocedure Evaluation (Signed)
Anesthesia Post Note  Patient: [name]  Procedure(s) Performed: AN AD HOC LABOR EPIDURAL     Patient location during evaluation: Mother Baby Anesthesia Type: Epidural Level of consciousness: awake and alert Pain management: pain level controlled Vital Signs Assessment: post-procedure vital signs reviewed and stable Respiratory status: spontaneous breathing, nonlabored ventilation and respiratory function stable Cardiovascular status: stable Postop Assessment: no headache, no backache and epidural receding Anesthetic complications: no   No complications documented.  Last Vitals:  Vitals:   03/21/20 1128 03/21/20 1530  BP: 129/87 133/77  Pulse: 96 82  Resp: 18 18  Temp: 37.1 C 37.2 C  SpO2:  99%    Last Pain:  Vitals:   03/21/20 1530  TempSrc:   PainSc: 0-No pain   Pain Goal: Patients Stated Pain Goal: 3 (03/19/20 2035)                 Nicole Schaefer

## 2020-03-21 NOTE — Anesthesia Preprocedure Evaluation (Addendum)
Anesthesia Evaluation  Patient identified by MRN, date of birth, ID band Patient awake    Reviewed: Allergy & Precautions, NPO status , Patient's Chart, lab work & pertinent test results  Airway Mallampati: II  TM Distance: >3 FB Neck ROM: Full    Dental no notable dental hx.    Pulmonary asthma ,    Pulmonary exam normal breath sounds clear to auscultation       Cardiovascular hypertension (preE), Normal cardiovascular exam Rhythm:Regular Rate:Normal     Neuro/Psych negative neurological ROS  negative psych ROS   GI/Hepatic negative GI ROS, Neg liver ROS,   Endo/Other  Morbid obesity (BMI 41)  Renal/GU negative Renal ROS  negative genitourinary   Musculoskeletal negative musculoskeletal ROS (+)   Abdominal   Peds  Hematology  (+) Blood dyscrasia (Hgb 10.3), anemia ,   Anesthesia Other Findings IOL for preE  Reproductive/Obstetrics (+) Pregnancy                            Anesthesia Physical Anesthesia Plan  ASA: III  Anesthesia Plan: Epidural   Post-op Pain Management:    Induction:   PONV Risk Score and Plan: Treatment may vary due to age or medical condition  Airway Management Planned: Natural Airway  Additional Equipment:   Intra-op Plan:   Post-operative Plan:   Informed Consent: I have reviewed the patients History and Physical, chart, labs and discussed the procedure including the risks, benefits and alternatives for the proposed anesthesia with the patient or authorized representative who has indicated his/her understanding and acceptance.       Plan Discussed with: Anesthesiologist  Anesthesia Plan Comments: (Patient identified. Risks, benefits, options discussed with patient including but not limited to bleeding, infection, nerve damage, paralysis, failed block, incomplete pain control, headache, blood pressure changes, nausea, vomiting, reactions to medication,  itching, and post partum back pain. Confirmed with bedside nurse the patient's most recent platelet count. Confirmed with the patient that they are not taking any anticoagulation, have any bleeding history or any family history of bleeding disorders. Patient expressed understanding and wishes to proceed. All questions were answered. )        Anesthesia Quick Evaluation

## 2020-03-22 MED ORDER — FERROUS SULFATE 325 (65 FE) MG PO TABS
325.0000 mg | ORAL_TABLET | ORAL | 1 refills | Status: DC
Start: 2020-03-22 — End: 2020-10-24

## 2020-03-22 MED ORDER — ASCORBIC ACID 250 MG PO TABS
250.0000 mg | ORAL_TABLET | ORAL | 1 refills | Status: DC
Start: 2020-03-22 — End: 2020-10-24

## 2020-03-22 MED ORDER — FERROUS SULFATE 325 (65 FE) MG PO TABS
325.0000 mg | ORAL_TABLET | ORAL | Status: DC
  Administered 2020-03-22: 325 mg via ORAL
  Filled 2020-03-22: qty 1

## 2020-03-22 MED ORDER — VITAMIN C 250 MG PO TABS
250.0000 mg | ORAL_TABLET | ORAL | Status: DC
  Administered 2020-03-22: 250 mg via ORAL
  Filled 2020-03-22: qty 1

## 2020-03-22 MED ORDER — SODIUM CHLORIDE 0.9 % IV SOLN: 510.0000 mg | Freq: Once | INTRAVENOUS | Status: DC

## 2020-03-22 MED ORDER — ACETAMINOPHEN 325 MG PO TABS
650.0000 mg | ORAL_TABLET | ORAL | 0 refills | Status: DC | PRN
Start: 2020-03-22 — End: 2021-03-14

## 2020-03-22 MED ORDER — IBUPROFEN 600 MG PO TABS
600.0000 mg | ORAL_TABLET | Freq: Four times a day (QID) | ORAL | 0 refills | Status: DC
Start: 2020-03-22 — End: 2020-10-10

## 2020-03-22 NOTE — Discharge Instructions (Signed)

## 2020-03-22 NOTE — Progress Notes (Signed)
POSTPARTUM PROGRESS NOTE  Subjective: Nicole Schaefer is a 19 y.o. G1P1001 s/p PPD#1 SVD at [redacted]w[redacted]d for pre-E without severe features.  She reports she doing well. No acute events overnight. She denies any problems with ambulating, voiding or po intake. Denies nausea or vomiting. She has passed flatus. Pain is well controlled.  Lochia is minimal and decreasing.  Objective: Blood pressure 117/80, pulse 93, temperature 98.9 F (37.2 C), temperature source Oral, resp. rate 16, height 5\' 3"  (1.6 m), weight 105.2 kg, last menstrual period 06/11/2019, SpO2 99 %, not currently breastfeeding.  Physical Exam:  General: alert, cooperative and no distress Chest: no respiratory distress Abdomen: soft, non-tender  Uterine Fundus: firm and at level of umbilicus Extremities: No calf swelling or tenderness  no edema Mental Status: Flat affect   Recent Labs    03/21/20 0301 03/21/20 0936  HGB 10.3* 8.4*  HCT 32.4* 27.1*    Assessment/Plan: Nicole Schaefer is a 19 y.o. G1P1001 s/p SVD IOL at [redacted]w[redacted]d for pre-E without severe features.  Routine Postpartum Care: PPD1, doing well, pain well-controlled. Patient is exhibiting a flat affect, unwilling to talk further about mental state. -- Continue routine care, lactation support  -- Contraception: Decide by postpartum care visit. Information provided for options. -- Feeding: Bottle  -- Does not desire circ for baby   #Flat Affect: patient exhibited flat affect on encounter this morning. Consult social work prior to discharge. Mood check at 1 week follow-up.  #Anemia due to Acute Blood Loss: Hgb 10.3 >> 8.4, asymptomatic. PO iron on discharge. Continue to monitor for symptoms.  #Pre-Eclampsia: one elevated BP on 10/5 at 2300 (141/94) but back down to 117/80, not on medication. Continue to monitor.  Dispo: Plan for discharge today once social work consult complete.  [redacted]w[redacted]d 03/22/2020 7:50 AM

## 2020-03-24 ENCOUNTER — Ambulatory Visit: Payer: Medicaid Other | Admitting: Internal Medicine

## 2020-03-24 NOTE — Progress Notes (Deleted)
Name: Nicole Schaefer  MRN/ DOB: [id number], [birth year]    Age/ Sex: 19 y.o., female     PCP: [facility], [facility]   Reason for Endocrinology Evaluation: Pre-diabetes      Initial Endocrinology Clinic Visit: 08/09/2019    PATIENT IDENTIFIER: Ms. Nicole Schaefer is a 19 y.o., female with a past medical history of Asthma. She has followed with [facility] since 08/09/2019 for consultative assistance with management of her prediabetes   HISTORICAL SUMMARY:  She was rdiagnosed with prediabetes with an A1c of 5.8% in 2021.  The patient eats 2 meals a day and gets a snack instead of her midday meal. Avoids sugar-sweetened beverages.    Goes to the gym twice a week She graduated HS.      Lives by herself.  Paternal family history of diabetes  SUBJECTIVE:    Today (03/24/2020):  Nicole Schaefer is here for a follow up on diabetes.    ROS:  As per HPI.   HISTORY:  Past Medical History:  Past Medical History:  Diagnosis Date  . Asthma    has been a while since inhaler/neb use   . Varicose vein of leg     Past Surgical History:  Past Surgical History:  Procedure Laterality Date  . LEG SURGERY Left      Social History:  reports that she has never smoked. She has never used smokeless tobacco. She reports that she does not drink alcohol and does not use drugs. Family History:  Family History  Problem Relation Age of Onset  . Healthy Mother   . Healthy Father   . Hypertension Father   . Hyperlipidemia Father   . Sleep apnea Father   . Diabetes Paternal Grandmother   . Heart attack Paternal Grandmother       HOME MEDICATIONS: Allergies as of 03/24/2020      Reactions   Pollen Extract    Runny nose      Medication List       Accurate as of March 24, 2020  7:25 AM. If you have any questions, ask your nurse or doctor.        acetaminophen 325 MG tablet Commonly known as: Tylenol Take 2 tablets (650 mg total) by mouth every 4  (four) hours as needed (for pain scale < 4).   albuterol 108 (90 Base) MCG/ACT inhaler Commonly known as: VENTOLIN HFA Inhale into the lungs every 6 (six) hours as needed.   ascorbic acid 250 MG tablet Commonly known as: VITAMIN C Take 1 tablet (250 mg total) by mouth every other day. Take with iron.   Blood Pressure Monitor Kit 1 Device by Does not apply route once a week. To be monitored Regularly at home.   ferrous sulfate 325 (65 FE) MG tablet Take 1 tablet (325 mg total) by mouth every other day.   fluticasone 44 MCG/ACT inhaler Commonly known as: FLOVENT HFA Inhale into the lungs 2 (two) times daily.   ibuprofen 600 MG tablet Commonly known as: ADVIL Take 1 tablet (600 mg total) by mouth every 6 (six) hours.   loratadine 10 MG tablet Commonly known as: CLARITIN Take 10 mg by mouth daily.   Mometasone Furo-Formoterol Fum 50-5 MCG/ACT Aero Inhale into the lungs.         OBJECTIVE:   PHYSICAL EXAM: VS: LMP 06/11/2019    EXAM: General: Pt appears well and is in NAD  Hydration: Well-hydrated with moist mucous membranes and good skin turgor  Eyes: External eye exam normal without stare, lid lag or exophthalmos.  EOM intact.  PERRL.  Ears, Nose, Throat: Hearing: Grossly intact bilaterally Dental: Good dentition  Throat: Clear without mass, erythema or exudate  Neck: General: Supple without adenopathy. Thyroid: Thyroid size normal.  No goiter or nodules appreciated. No thyroid bruit.  Lungs: Clear with good BS bilat with no rales, rhonchi, or wheezes  Heart: Auscultation: RRR.  Abdomen: Normoactive bowel sounds, soft, nontender, without masses or organomegaly palpable  Extremities: Gait and station: Normal gait  Digits and nails: No clubbing, cyanosis, petechiae, or nodes Head and neck: Normal alignment and mobility BL UE: Normal ROM and strength. BL LE: No pretibial edema normal ROM and strength.  Skin: Hair: Texture and amount normal with gender appropriate  distribution Skin Inspection: No rashes, acanthosis nigricans/skin tags. No lipohypertrophy Skin Palpation: Skin temperature, texture, and thickness normal to palpation  Neuro: Cranial nerves: II - XII grossly intact  Cerebellar: Normal coordination and movement; no tremor Motor: Normal strength throughout DTRs: 2+ and symmetric in UE without delay in relaxation phase  Mental Status: Judgment, insight: Intact Orientation: Oriented to time, place, and person Memory: Intact for recent and remote events Mood and affect: No depression, anxiety, or agitation     DATA REVIEWED: ***    ASSESSMENT / PLAN / RECOMMENDATIONS:   1. ***  Plan:  ***    Medications   ***   Signed electronically by: Mack Guise, MD  [facility] End[facility]  [facility] [address] [place], [place] [address] Phone: [phone] FAX: [phone]      CC: Medicine, [facility] [place] [address] Phone: [phone]  Fax: [phone]   Return to Endocrinology clinic as below: Future Appointments  Date Time Provider [place]  03/24/2020  8:50 AM Brightyn Mozer, Melanie Crazier, MD [facility] None  03/28/2020  1:20 PM [place] None  04/07/2020 10:15 AM [place] Bobbye Morton, PA-C [facility]  04/18/2020  3:00 PM Leftwich-Kirby, Kathie Dike, CNM [facility] None

## 2020-03-28 ENCOUNTER — Ambulatory Visit (INDEPENDENT_AMBULATORY_CARE_PROVIDER_SITE_OTHER): Payer: Medicaid Other

## 2020-03-28 ENCOUNTER — Other Ambulatory Visit: Payer: Self-pay

## 2020-03-28 VITALS — BP 130/85 | HR 91 | Wt 208.0 lb

## 2020-03-28 DIAGNOSIS — O135 Gestational [pregnancy-induced] hypertension without significant proteinuria, complicating the puerperium: Secondary | ICD-10-CM

## 2020-03-28 DIAGNOSIS — Z0130 Encounter for examination of blood pressure without abnormal findings: Secondary | ICD-10-CM

## 2020-03-28 NOTE — Progress Notes (Signed)
Subjective:  Nicole Schaefer is a 19 y.o. female here for BP check at 1 week PP.  Hypertension ROS: no TIA's, no chest pain on exertion, no dyspnea on exertion and no swelling of ankles, no headaches, no blurry visions.    Objective:  BP 130/85    Pulse 91    Wt 208 lb (94.3 kg)    LMP 06/11/2019    Breastfeeding No    BMI 36.85 kg/m   Appearance alert, well appearing, and in no distress. General exam BP noted to be well controlled today in office.    Assessment:   Blood Pressure well controlled.   Plan:  Current treatment plan is effective, no change in therapy.per Dr. Gerri Spore.   Preeclampsia precautions reviewed with patient. Keep PP Appt.

## 2020-03-29 NOTE — Progress Notes (Signed)
Patient was assessed and managed by nursing staff during this encounter. I have reviewed the chart and agree with the documentation and plan. I have also made any necessary editorial changes.  Jaydence Vanyo, MD 03/29/2020 8:38 AM 

## 2020-04-07 ENCOUNTER — Telehealth: Payer: Medicaid Other | Admitting: Physician Assistant

## 2020-04-10 ENCOUNTER — Inpatient Hospital Stay (HOSPITAL_COMMUNITY): Admit: 2020-04-10 | Payer: Self-pay

## 2020-04-18 ENCOUNTER — Telehealth (INDEPENDENT_AMBULATORY_CARE_PROVIDER_SITE_OTHER): Payer: Medicaid Other | Admitting: Advanced Practice Midwife

## 2020-04-18 DIAGNOSIS — Z8759 Personal history of other complications of pregnancy, childbirth and the puerperium: Secondary | ICD-10-CM | POA: Insufficient documentation

## 2020-04-18 NOTE — Progress Notes (Signed)
Post Partum Visit Note VIRTUAL/VIDEO VISIT ENCOUNTER NOTE  Provider location: Center for [facility]   I connected with Nicole Schaefer on 04/18/20 at  3:00 PM EDT by MyChart Video Encounter at home and verified that I am speaking with the correct person using two identifiers.   I discussed the limitations, risks, security and privacy concerns of performing an evaluation and management service virtually and the availability of in person appointments. I also discussed with the patient that there may be a patient responsible charge related to this service. The patient expressed understanding and agreed to proceed.   Nicole Schaefer is a 19 y.o. G40P1001 female who presents for a postpartum visit. She is 4 weeks postpartum following a normal spontaneous vaginal delivery.  I have fully reviewed the prenatal and intrapartum course. The delivery was at 37.1 gestational weeks.  Anesthesia: epidural. Postpartum course has been doing well. Baby is doing well. Baby is feeding by bottle- Gerber Gentle. Bleeding no bleeding. Bowel function is normal. Bladder function is normal. Patient is not sexually active. Contraception method is abstinence.  Postpartum depression screening: negative, score 0.   The pregnancy intention screening data noted above was reviewed. Potential methods of contraception were discussed. The patient elected to proceed with Oral Contraceptive.    Edinburgh Postnatal Depression Scale - 04/18/20 1508      Edinburgh Postnatal Depression Scale:  In the Past 7 Days   I have been able to laugh and see the funny side of things. 0    I have looked forward with enjoyment to things. 0    I have blamed myself unnecessarily when things went wrong. 0    I have been anxious or worried for no good reason. 0    I have felt scared or panicky for no good reason. 0    Things have been getting on top of me. 0    I have been so unhappy that I have had difficulty sleeping. 0    I  have felt sad or miserable. 0    I have been so unhappy that I have been crying. 0    The thought of harming myself has occurred to me. 0    Edinburgh Postnatal Depression Scale Total 0            The following portions of the patient's history were reviewed and updated as appropriate: allergies, current medications, past family history, past medical history, past social history, past surgical history and problem list.  Review of Systems Pertinent items noted in HPI and remainder of comprehensive ROS otherwise negative.    Objective:  Last menstrual period 06/11/2019, not currently breastfeeding.   Physical Exam:   General:  Alert, oriented and cooperative. Patient appears to be in no acute distress.  Mental Status: Normal mood and affect. Normal behavior. Normal judgment and thought content.   Respiratory: Normal respiratory effort, no problems with respiration noted  Rest of physical exam deferred due to type of encounter  Labs and Imaging No results found for this or any previous visit (from the past 336 hour(s)). No results found.      Assessment:    Normal postpartum exam.  Plan:   Essential components of care per ACOG recommendations:  1.  Mood and well being: Patient with negative depression screening today. Reviewed local resources for support.  - Patient does not use tobacco. - hx of drug use? No    2. Infant care and feeding:  -Patient currently  breastmilk feeding? No  -Social determinants of health (SDOH) reviewed in EPIC. No concerns  3. Sexuality, contraception and birth spacing - Patient does not want a pregnancy in the next year.  - Reviewed forms of contraception in tiered fashion. Patient desired oral contraceptives (estrogen/progesterone) today.  She has appointment with her PCP and prefers to have prescription from PCP. No Rx written today. I recommend close follow up in 3 months with [place] or with her PCP to evaluate how pt is doing with pills, daily  dosing, etc.  - Discussed birth spacing of 18 months  4. Sleep and fatigue -Encouraged family/partner/community support of 4 hrs of uninterrupted sleep to help with mood and fatigue  5. Physical Recovery  - Discussed patients delivery - Patient had a first degree labial laceration only, perineal healing reviewed. Patient expressed understanding - Patient has urinary incontinence? No - Patient is safe to resume physical and sexual activity  6.  Health Maintenance - Last pap smear not done r/t pt young age   34. Chronic conditions --Pt with normal BP at 1 week BP check, not on meds. BP at home 120s/80s today. Pt to f/u with PCP as planned.  Sharen Counter, CNM Center for [employer], [facility]

## 2020-06-17 NOTE — L&D Delivery Note (Signed)
OB/GYN Faculty Practice Delivery Note  Nicole Schaefer is a 20 y.o. G2P1001 s/p SVD at [redacted]w[redacted]d. She was admitted for SOL.   ROM: 3h 64m with clear fluid GBS Status:  Negative/-- (11/16 1454) Maximum Maternal Temperature: 98.52F  Labor Progress: Initial SVE: 6.5/90/-2. She then progressed to complete with expectant management, added pit briefly while pushing.   Delivery Date/Time: 11/23, 1905  Delivery: Called to room and patient was complete and pushing. Head delivered midline OA. No nuchal cord present. Some brief difficulty initially with delivering shoulder, however when placing patient into a trendelenburg position, the shoulder released allowing the body to be delivered in usual fashion. Infant with cry after bulb suction, placed on mother's abdomen, dried and stimulated. Cord clamped x 2 after 1-minute delay, and cut by FOB. Cord blood drawn. Placenta delivered spontaneously with gentle cord traction. Fundus firm with massage and Pitocin. Manual lower uterine sweep performed with improved lower uterine tone and retrieval of small amount of clots. Labia, perineum, vagina, and cervix inspected with no lacerations.  Baby Weight: pending  Placenta: 3 vessel, intact. Sent to L&D Complications: None Lacerations: None EBL: 200 mL Analgesia: Epidural   Infant:  APGAR (1 MIN): 8   APGAR (5 MINS): 9    Leticia Penna, DO  OB Family Medicine Fellow, [facility] for [facility], [facility] 05/09/2021, 7:27 PM

## 2020-09-12 IMAGING — US US OB COMP LESS 14 WK
1 series · 15 of 19 positions shown · non-contrast
Comparison: None.

CLINICAL DATA: Cramping.

EXAM:
OBSTETRIC <14 WK ULTRASOUND
TECHNIQUE: Transabdominal ultrasound was performed for evaluation of the
gestation as well as the maternal uterus and adnexal regions.

[Series 1: us ob comp less 14 wk · 15 of 19 slices shown]
[im 1/19]
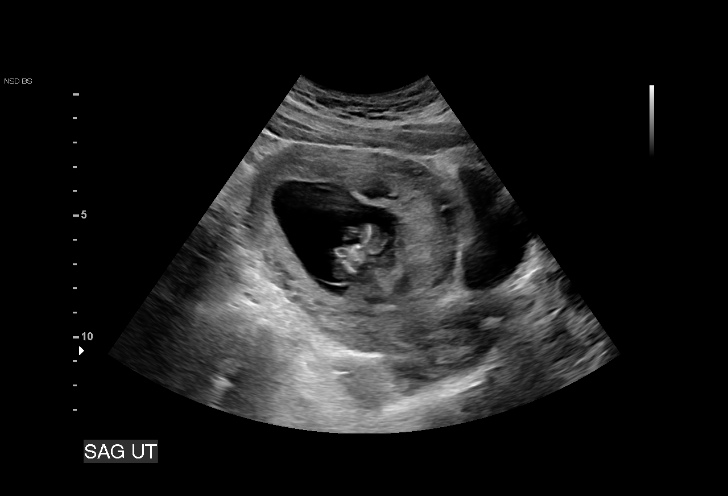
[im 2/19]
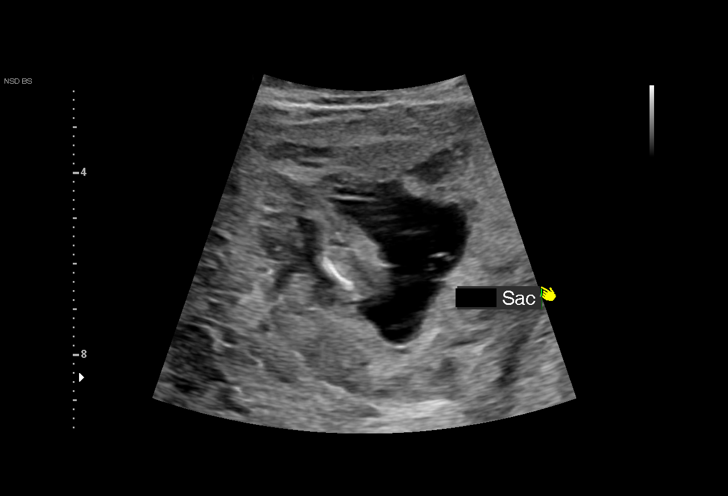
[im 4/19]
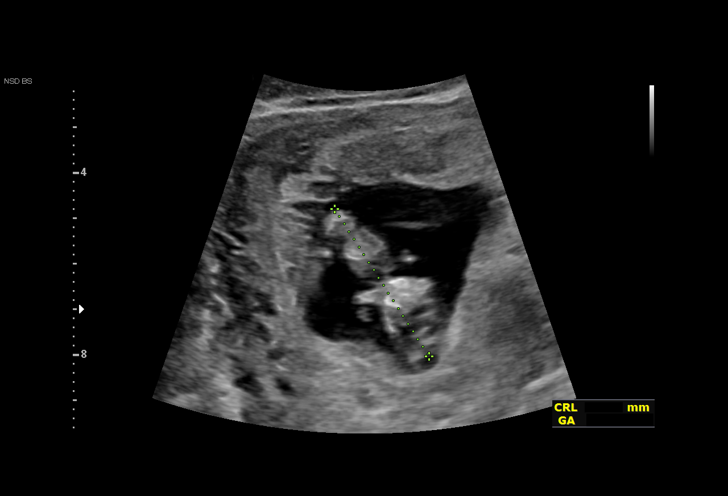
[im 5/19]
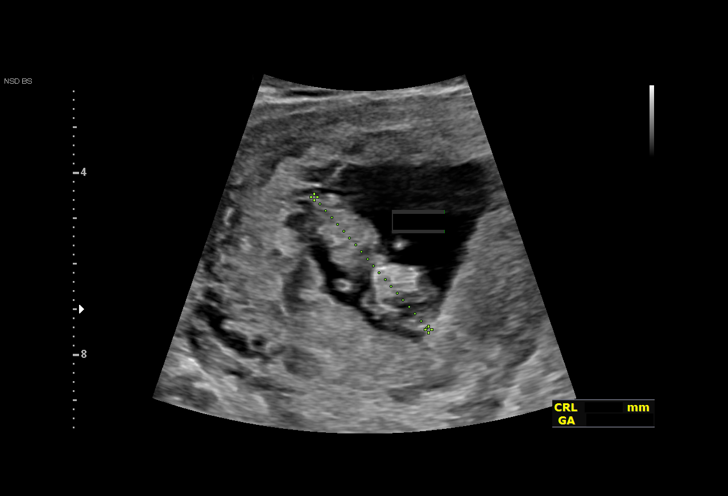
[im 6/19]
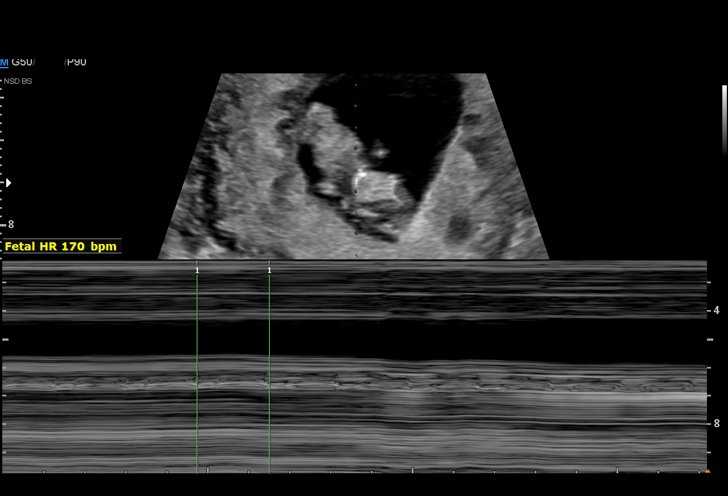
[im 7/19]
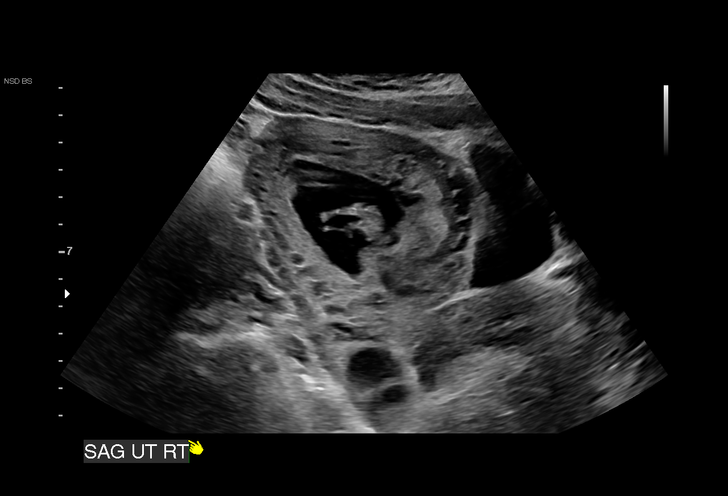
[im 9/19]
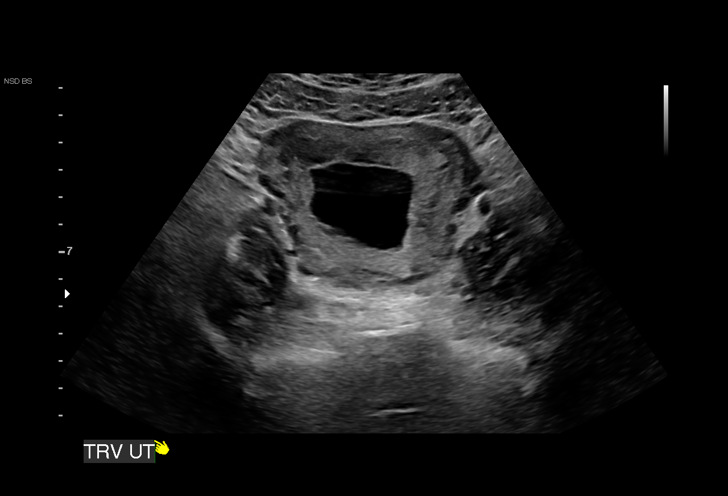
[im 10/19]
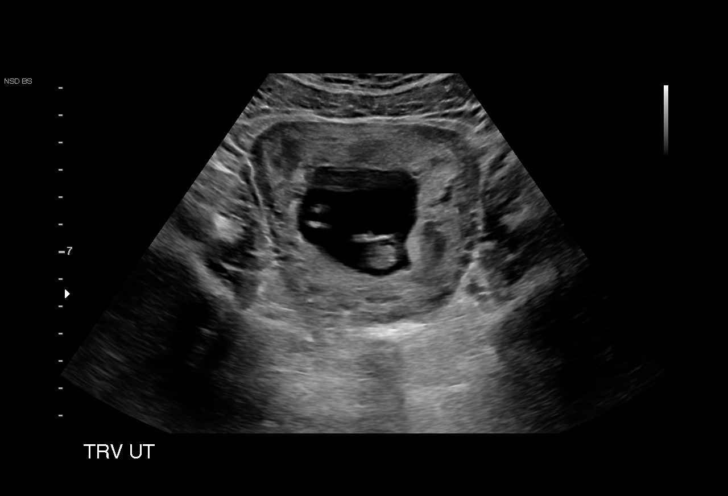
[im 11/19]
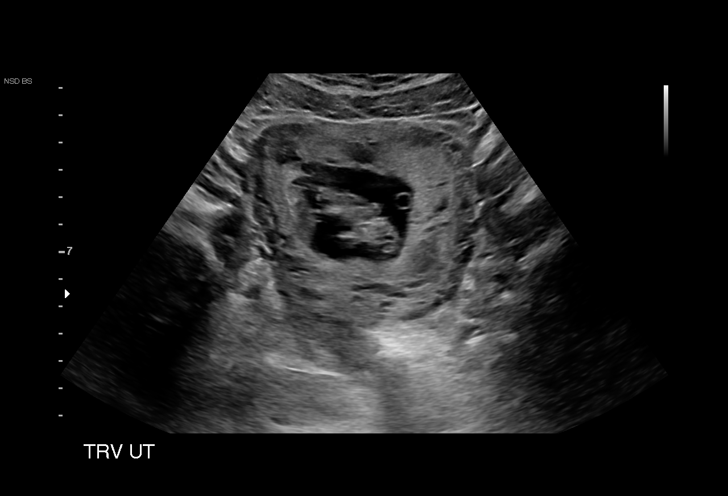
[im 13/19]
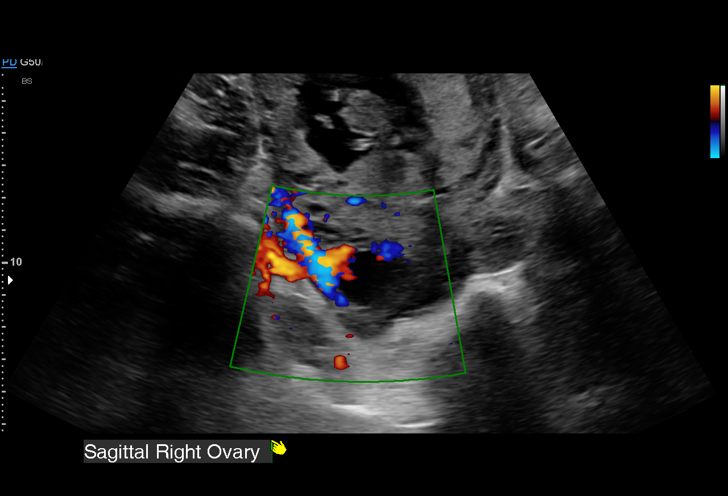
[im 14/19]
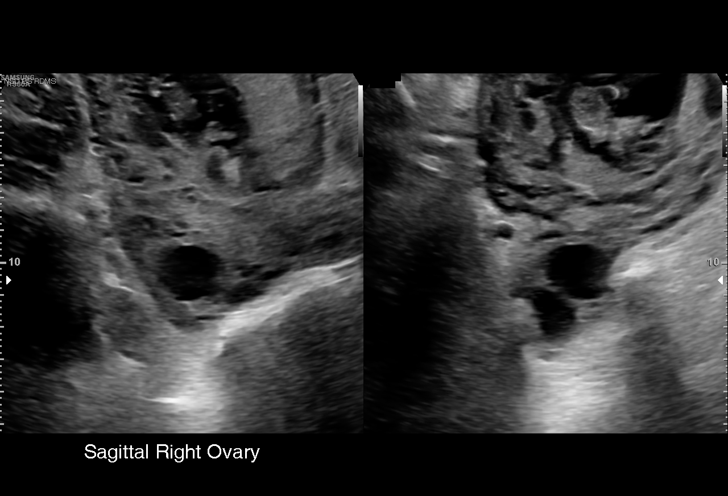
[im 15/19]
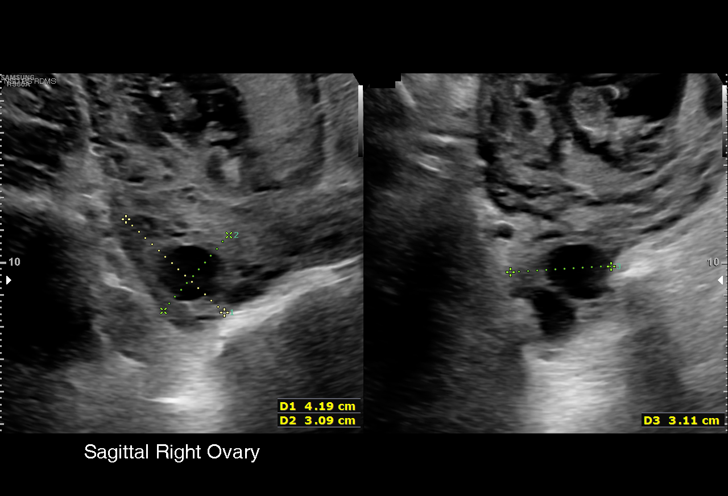
[im 16/19]
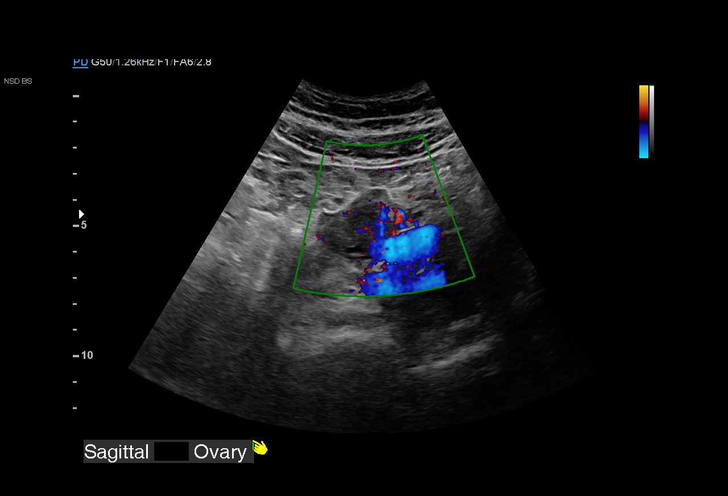
[im 18/19]
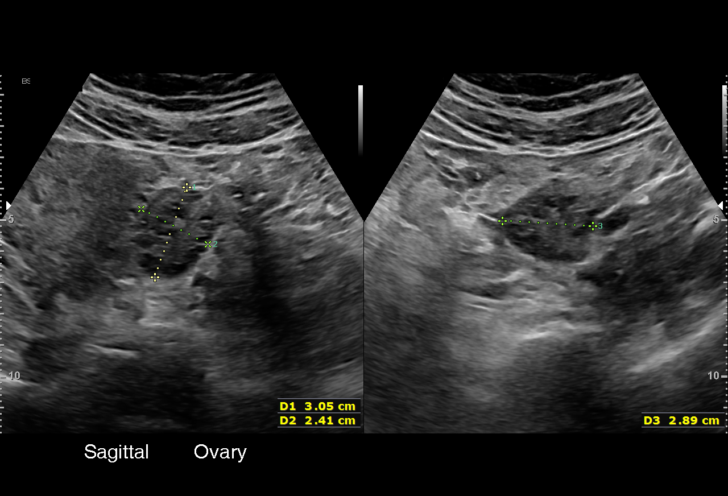
[im 19/19]
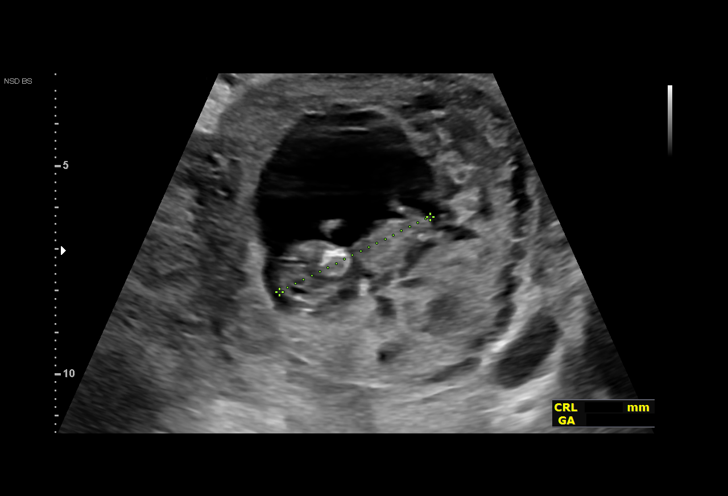

[15 of 19 positions shown; findings below may reference images not displayed]

FINDINGS: Intrauterine gestational sac: None

Yolk sac:  Visualized.

Embryo:  Visualized.

Cardiac Activity: Visualized.

Heart Rate: 170 bpm

CRL:   39.1 mm   10 w 6 d                  US EDC: April 10, 2020

Subchorionic hemorrhage:  None visualized.

Maternal uterus/adnexae: The bilateral ovaries are normal in
appearance.
IMPRESSION: Single, viable intrauterine pregnancy at approximately 10 weeks and
6 days gestation by ultrasound evaluation.

## 2020-10-03 NOTE — H&P (Signed)
Subjective:     Patient ID: Nicole Schaefer is a 20 y.o. female.  HPI  Here for follow up consultation breast reduction. Last seen 12/2018. Current 42 DD. Reports over 3 year duration neck and back pain. Has tried specialty fitted bras, Tylenol, regular stretches/exercise, hot/cold packs for over year trial without relief. Notes pain limits her ability to work which requires lifting prolonged standing bending/stooping.  Denies rashes. Denies numbness hands.   Wt up 25 lb since last visit here. Delivered first child 03/2020. States highest weight with pregnancy 230 lb. Did not breast feed. Denies any nipple/breast discharger.  No prior MMG. Denies and FH breast or ovarian ca.  Sen by Dr. Marcial Pacas last 10/2019 for follow up LE venous malformation.  Lives with infant son and boyfriend. Working at [employer]. States parents would assist with post op child care.  Review of Systems  Respiratory: Positive for shortness of breath.   Musculoskeletal: Positive for back pain and neck pain.  Remainder 12 point review negative    Objective:   Physical Exam  Cardiovascular: Normal rate, regular rhythm and normal heart sounds.  Pulmonary/Chest: Effort normal and breath sounds normal.  Neurological: She is alert.  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 The pain is not associated with another diagnosis. There are no other causes for back and neck pain on exam or by history. Breast reduction has expectation to result in significant improvement of impairment.  Reviewed reduction with anchor type scars,possible drains, post operative visits and limitations, recovery.  Diminished sensation nipple and breast skin, risk of nipple loss, wound healing problems, asymmetry, incidental carcinoma, changes with wt gain/loss, aging, pregnancy unacceptable cosmetic appearance reviewed.Discussed effect on breast feeding.    Additional risks including but not limited to bleeding infection seroma hematoma blood clots in legs or lungs, poor scarring, need for additional surgery, damage to adjacent structures reviewed.  Anticipate861g reduction from each breast.   Drain teaching completed. Rx forNorco given. Patient desires overnight stay.  Discussed risk COVID infectionthrough this elective surgery. Patient will receive COVID testing prior to surgery. Discussed even if patient receivesa negative test result, the tests in some cases may fail to detect the virus or patient maycontract COVID after the test.COVID 19 infectionbefore/during/aftersurgery may result in lead to a higher chance of complication and death.

## 2020-10-06 ENCOUNTER — Encounter (HOSPITAL_COMMUNITY): Payer: Self-pay

## 2020-10-06 ENCOUNTER — Emergency Department (HOSPITAL_COMMUNITY)
Admission: EM | Admit: 2020-10-06 | Discharge: 2020-10-06 | Discharge status: Against Medical Advice | Payer: Medicaid Other | Attending: Emergency Medicine | Admitting: Emergency Medicine

## 2020-10-06 ENCOUNTER — Other Ambulatory Visit: Payer: Self-pay

## 2020-10-06 DIAGNOSIS — Z5321 Procedure and treatment not carried out due to patient leaving prior to being seen by health care provider: Secondary | ICD-10-CM | POA: Insufficient documentation

## 2020-10-06 DIAGNOSIS — R630 Anorexia: Secondary | ICD-10-CM | POA: Diagnosis present

## 2020-10-06 LAB — CBC WITH DIFFERENTIAL/PLATELET
Abs Immature Granulocytes: 0.01 10*3/uL (ref 0.00–0.07)
Basophils Absolute: 0 10*3/uL (ref 0.0–0.1)
Basophils Relative: 0 %
Eosinophils Absolute: 0 10*3/uL (ref 0.0–0.5)
Eosinophils Relative: 1 %
HCT: 37.5 % (ref 36.0–46.0)
Hemoglobin: 11.5 g/dL — ABNORMAL LOW (ref 12.0–15.0)
Immature Granulocytes: 0 %
Lymphocytes Relative: 21 %
Lymphs Abs: 1.4 10*3/uL (ref 0.7–4.0)
MCH: 25.1 pg — ABNORMAL LOW (ref 26.0–34.0)
MCHC: 30.7 g/dL (ref 30.0–36.0)
MCV: 81.7 fL (ref 80.0–100.0)
Monocytes Absolute: 0.6 10*3/uL (ref 0.1–1.0)
Monocytes Relative: 8 %
Neutro Abs: 4.6 10*3/uL (ref 1.7–7.7)
Neutrophils Relative %: 70 %
Platelets: 348 10*3/uL (ref 150–400)
RBC: 4.59 MIL/uL (ref 3.87–5.11)
RDW: 15.3 % (ref 11.5–15.5)
WBC: 6.7 10*3/uL (ref 4.0–10.5)
nRBC: 0 % (ref 0.0–0.2)

## 2020-10-06 LAB — URINALYSIS, ROUTINE W REFLEX MICROSCOPIC
Glucose, UA: NEGATIVE mg/dL
Hgb urine dipstick: NEGATIVE
Ketones, ur: 5 mg/dL — AB
Leukocytes,Ua: NEGATIVE
Nitrite: NEGATIVE
Protein, ur: >=300 mg/dL — AB
Specific Gravity, Urine: 1.033 — ABNORMAL HIGH (ref 1.005–1.030)
Squamous Epithelial / HPF: >50 — ABNORMAL HIGH (ref 0–5)
pH: 6 (ref 5.0–8.0)

## 2020-10-06 LAB — COMPREHENSIVE METABOLIC PANEL
ALT: 37 U/L (ref 0–44)
AST: 26 U/L (ref 15–41)
Albumin: 3.4 g/dL — ABNORMAL LOW (ref 3.5–5.0)
Alkaline Phosphatase: 99 U/L (ref 38–126)
Anion gap: 8 (ref 5–15)
BUN: 6 mg/dL (ref 6–20)
CO2: 24 mmol/L (ref 22–32)
Calcium: 8.8 mg/dL — ABNORMAL LOW (ref 8.9–10.3)
Chloride: 102 mmol/L (ref 98–111)
Creatinine, Ser: 0.89 mg/dL (ref 0.44–1.00)
GFR, Estimated: >60 mL/min (ref >60)
Glucose, Bld: 97 mg/dL (ref 70–99)
Potassium: 3.4 mmol/L — ABNORMAL LOW (ref 3.5–5.1)
Sodium: 134 mmol/L — ABNORMAL LOW (ref 135–145)
Total Bilirubin: 0.3 mg/dL (ref 0.3–1.2)
Total Protein: 7.7 g/dL (ref 6.5–8.1)

## 2020-10-06 LAB — I-STAT BETA HCG BLOOD, ED (MC, WL, AP ONLY): I-stat hCG, quantitative: >2000 m[IU]/mL — ABNORMAL HIGH (ref <5)

## 2020-10-06 LAB — LIPASE, BLOOD: Lipase: 52 U/L — ABNORMAL HIGH (ref 11–51)

## 2020-10-06 NOTE — ED Triage Notes (Signed)
Emergency Medicine Provider Triage Evaluation Note  Nicole Schaefer , a 20 y.o. female  was evaluated in triage.  Pt complains of periumbilical abdominal pain causing decreased appetite.  Reports that she has had periumbilical abdominal pain for last 3 days.  Pain does not radiate anywhere.  Pain has been constant and unchanged for this time period.  She reports that because she is having abdominal pain she does not want to eat.  Patient is able to tolerate p.o. liquids without difficulty.  Patient having no difficulty swallowing.  Patient does not have any nausea, vomiting, diarrhea, constipation, hematuria, dysuria, urinary frequency, fevers, chills, cough, rhinorrhea, loss of smell or taste, chest pain, shortness of breath.    Review of Systems  Positive: Abdominal pain, decreased appetite Negative: nausea, vomiting, diarrhea, constipation, hematuria, dysuria, urinary frequency, fevers, chills, cough, rhinorrhea, loss of smell or taste, chest pain, shortness of breath.   Physical Exam  BP 113/69 (BP Location: Left Arm)   Pulse (!) 104   Temp 99.5 F (37.5 C) (Oral)   Resp (!) 28   Ht 5\' 3"  (1.6 m)   Wt 94.3 kg   SpO2 99%   BMI 36.85 kg/m  Gen:   Awake, no distress   HEENT:  Atraumatic  Resp:  Normal effort  Cardiac:  Tachycardic at rate 104 Abd:   Nondistended, soft, nontender  MSK:   Moves extremities without difficulty  Neuro:  Speech clear   Medical Decision Making  Medically screening exam initiated at 7:36 PM.  Appropriate orders placed.  Nicole Schaefer was informed that the remainder of the evaluation will be completed by another provider, this initial triage assessment does not replace that evaluation, and the importance of remaining in the ED until their evaluation is complete.  Clinical Impression   The patient appears stable so that the remainder of the work up may be completed by another provider.      Harriet Pho, Haskel Schroeder 10/06/20 1938

## 2020-10-06 NOTE — ED Triage Notes (Signed)
Patient reports decreased appetite x a few days, reports she is able to eat and drink but has not been feeling like eating.

## 2020-10-10 ENCOUNTER — Inpatient Hospital Stay (HOSPITAL_COMMUNITY): Payer: Medicaid Other

## 2020-10-10 ENCOUNTER — Inpatient Hospital Stay (HOSPITAL_COMMUNITY)
Admission: AD | Admit: 2020-10-10 | Discharge: 2020-10-10 | Disposition: A | Discharge status: Home / Self Care | Payer: Medicaid Other | Attending: Obstetrics and Gynecology | Admitting: Obstetrics and Gynecology

## 2020-10-10 ENCOUNTER — Other Ambulatory Visit: Payer: Self-pay

## 2020-10-10 ENCOUNTER — Encounter (HOSPITAL_COMMUNITY): Payer: Self-pay | Admitting: Obstetrics and Gynecology

## 2020-10-10 DIAGNOSIS — Z3A01 Less than 8 weeks gestation of pregnancy: Secondary | ICD-10-CM | POA: Diagnosis not present

## 2020-10-10 DIAGNOSIS — R7401 Elevation of levels of liver transaminase levels: Secondary | ICD-10-CM | POA: Diagnosis not present

## 2020-10-10 DIAGNOSIS — R102 Pelvic and perineal pain: Secondary | ICD-10-CM

## 2020-10-10 DIAGNOSIS — R109 Unspecified abdominal pain: Secondary | ICD-10-CM | POA: Insufficient documentation

## 2020-10-10 DIAGNOSIS — O26891 Other specified pregnancy related conditions, first trimester: Secondary | ICD-10-CM | POA: Diagnosis not present

## 2020-10-10 DIAGNOSIS — Z3490 Encounter for supervision of normal pregnancy, unspecified, unspecified trimester: Secondary | ICD-10-CM

## 2020-10-10 LAB — URINALYSIS, ROUTINE W REFLEX MICROSCOPIC
Bilirubin Urine: NEGATIVE
Glucose, UA: NEGATIVE mg/dL
Ketones, ur: NEGATIVE mg/dL
Nitrite: NEGATIVE
Protein, ur: 100 mg/dL — AB
Specific Gravity, Urine: 1.024 (ref 1.005–1.030)
Squamous Epithelial / HPF: >50 — ABNORMAL HIGH (ref 0–5)
pH: 6 (ref 5.0–8.0)

## 2020-10-10 LAB — CBC
HCT: 36.9 % (ref 36.0–46.0)
Hemoglobin: 11.7 g/dL — ABNORMAL LOW (ref 12.0–15.0)
MCH: 25 pg — ABNORMAL LOW (ref 26.0–34.0)
MCHC: 31.7 g/dL (ref 30.0–36.0)
MCV: 78.8 fL — ABNORMAL LOW (ref 80.0–100.0)
Platelets: 380 10*3/uL (ref 150–400)
RBC: 4.68 MIL/uL (ref 3.87–5.11)
RDW: 15 % (ref 11.5–15.5)
WBC: 5.2 10*3/uL (ref 4.0–10.5)
nRBC: 0 % (ref 0.0–0.2)

## 2020-10-10 LAB — COMPREHENSIVE METABOLIC PANEL
ALT: 61 U/L — ABNORMAL HIGH (ref 0–44)
AST: 33 U/L (ref 15–41)
Albumin: 3.2 g/dL — ABNORMAL LOW (ref 3.5–5.0)
Alkaline Phosphatase: 103 U/L (ref 38–126)
Anion gap: 10 (ref 5–15)
BUN: 7 mg/dL (ref 6–20)
CO2: 23 mmol/L (ref 22–32)
Calcium: 9.1 mg/dL (ref 8.9–10.3)
Chloride: 103 mmol/L (ref 98–111)
Creatinine, Ser: 0.8 mg/dL (ref 0.44–1.00)
GFR, Estimated: >60 mL/min (ref >60)
Glucose, Bld: 87 mg/dL (ref 70–99)
Potassium: 3.5 mmol/L (ref 3.5–5.1)
Sodium: 136 mmol/L (ref 135–145)
Total Bilirubin: 0.5 mg/dL (ref 0.3–1.2)
Total Protein: 7.2 g/dL (ref 6.5–8.1)

## 2020-10-10 LAB — WET PREP, GENITAL
Clue Cells Wet Prep HPF POC: NONE SEEN
Sperm: NONE SEEN
Trich, Wet Prep: NONE SEEN
Yeast Wet Prep HPF POC: NONE SEEN

## 2020-10-10 LAB — HCG, QUANTITATIVE, PREGNANCY: hCG, Beta Chain, Quant, S: 45194 m[IU]/mL — ABNORMAL HIGH (ref <5)

## 2020-10-10 NOTE — MAU Provider Note (Signed)
History     CSN: [id number]  Arrival date and time: 10/10/20 0881   Event Date/Time   First Provider Initiated Contact with Patient 10/10/20 1235      Chief Complaint  Patient presents with  . Abdominal Pain   Ms. Nicole Schaefer is a 20 y.o. G2P1001 at 36w1dwho presents to MAU for cramping. Patient reports cramping started about one week ago, and reports it felt like menstrual-like cramping, and patient thought she was about to have her period. [facility]. Patient reports the pain is not present at this time, because the pain is intermittent. Patient reports she has not taken anything for the pain because it is "not that bad" but present. Patient reports she really just wanted to come in to make sure everything was OK because she was having trouble getting in with the single OB provider she tried to call.  Pt denies VB, LOF, ctx, decreased FM, vaginal discharge/odor/itching. Pt denies N/V, abdominal pain, constipation, diarrhea, or urinary problems. Pt denies fever, chills, fatigue, sweating or changes in appetite. Pt denies SOB or chest pain. Pt denies dizziness, HA, light-headedness, weakness.  Problems this pregnancy include: pt has not yet been seen. Allergies? pollen Current medications/supplements? Updated in chart Prenatal care provider? Unsure, requests list of OB providers   OB History    Gravida  2   Para  1   Term  1   Preterm      AB      Living  1     SAB      IAB      Ectopic      Multiple  0   Live Births  1           Past Medical History:  Diagnosis Date  . Asthma    has been a while since inhaler/neb use   . Varicose vein of leg     Past Surgical History:  Procedure Laterality Date  . LEG SURGERY Left     Family History  Problem Relation Age of Onset  . Healthy Mother   . Healthy Father   . Hypertension Father   . Hyperlipidemia Father   . Sleep apnea Father    . Diabetes Paternal Grandmother   . Heart attack Paternal Grandmother     Social History   Tobacco Use  . Smoking status: Never Smoker  . Smokeless tobacco: Never Used  Vaping Use  . Vaping Use: Never used  Substance Use Topics  . Alcohol use: No  . Drug use: No    Allergies:  Allergies  Allergen Reactions  . Pollen Extract     Runny nose    Medications Prior to Admission  Medication Sig Dispense Refill Last Dose  . acetaminophen (TYLENOL) 325 MG tablet Take 2 tablets (650 mg total) by mouth every 4 (four) hours as needed (for pain scale < 4). 60 tablet 0 Past Month at Unknown time  . albuterol (VENTOLIN HFA) 108 (90 Base) MCG/ACT inhaler Inhale into the lungs every 6 (six) hours as needed.   Past Month at Unknown time  . ferrous sulfate 325 (65 FE) MG tablet Take 1 tablet (325 mg total) by mouth every other day. 30 tablet 1 Past Month at Unknown time  . fluticasone (FLOVENT HFA) 44 MCG/ACT inhaler Inhale into the lungs 2 (two) times daily.   Past Month at Unknown time  . loratadine (CLARITIN) 10 MG  tablet Take 10 mg by mouth daily.   10/09/2020 at Unknown time  . Mometasone Furo-Formoterol Fum 50-5 MCG/ACT AERO Inhale into the lungs.   Past Month at Unknown time  . Blood Pressure Monitor KIT 1 Device by Does not apply route once a week. To be monitored Regularly at home. 1 kit 0   . ibuprofen (ADVIL) 600 MG tablet Take 1 tablet (600 mg total) by mouth every 6 (six) hours. 30 tablet 0   . vitamin C (VITAMIN C) 250 MG tablet Take 1 tablet (250 mg total) by mouth every other day. Take with iron. 30 tablet 1     Review of Systems  Constitutional: Negative for chills, diaphoresis, fatigue and fever.  Eyes: Negative for visual disturbance.  Respiratory: Negative for shortness of breath.   Cardiovascular: Negative for chest pain.  Gastrointestinal: Negative for abdominal pain, constipation, diarrhea, nausea and vomiting.  Genitourinary: Positive for pelvic pain (menstrual-like  cramping). Negative for dysuria, flank pain, frequency, urgency, vaginal bleeding and vaginal discharge.  Neurological: Negative for dizziness, weakness, light-headedness and headaches.   Physical Exam   Blood pressure 108/68, pulse 82, temperature 97.9 F (36.6 C), temperature source Oral, resp. rate 20, height 5' 3"  (1.6 m), weight 97.6 kg, last menstrual period 06/23/2020, SpO2 100 %, not currently breastfeeding.  Patient Vitals for the past 24 hrs:  BP Temp Temp src Pulse Resp SpO2 Height Weight  10/10/20 1041 108/68 97.9 F (36.6 C) Oral 82 20 100 % 5' 3"  (1.6 m) 97.6 kg   Physical Exam Vitals and nursing note reviewed.  Constitutional:      General: She is not in acute distress.    Appearance: Normal appearance. She is not ill-appearing, toxic-appearing or diaphoretic.  HENT:     Head: Normocephalic and atraumatic.  Pulmonary:     Effort: Pulmonary effort is normal.  Neurological:     Mental Status: She is alert and oriented to person, place, and time.  Psychiatric:        Mood and Affect: Mood normal.        Behavior: Behavior normal.        Thought Content: Thought content normal.        Judgment: Judgment normal.    Results for orders placed or performed during the hospital encounter of 10/10/20 (from the past 24 hour(s))  Urinalysis, Routine w reflex microscopic     Status: Abnormal   Collection Time: 10/10/20 10:30 AM  Result Value Ref Range   Color, Urine AMBER (A) YELLOW   APPearance CLOUDY (A) CLEAR   Specific Gravity, Urine 1.024 1.005 - 1.030   pH 6.0 5.0 - 8.0   Glucose, UA NEGATIVE NEGATIVE mg/dL   Hgb urine dipstick SMALL (A) NEGATIVE   Bilirubin Urine NEGATIVE NEGATIVE   Ketones, ur NEGATIVE NEGATIVE mg/dL   Protein, ur 100 (A) NEGATIVE mg/dL   Nitrite NEGATIVE NEGATIVE   Leukocytes,Ua SMALL (A) NEGATIVE   RBC / HPF 0-5 0 - 5 RBC/hpf   WBC, UA 11-20 0 - 5 WBC/hpf   Bacteria, UA RARE (A) NONE SEEN   Squamous Epithelial / LPF >50 (H) 0 - 5   Mucus  PRESENT   CBC     Status: Abnormal   Collection Time: 10/10/20 10:35 AM  Result Value Ref Range   WBC 5.2 4.0 - 10.5 K/uL   RBC 4.68 3.87 - 5.11 MIL/uL   Hemoglobin 11.7 (L) 12.0 - 15.0 g/dL   HCT 36.9 36.0 - 46.0 %  MCV 78.8 (L) 80.0 - 100.0 fL   MCH 25.0 (L) 26.0 - 34.0 pg   MCHC 31.7 30.0 - 36.0 g/dL   RDW 15.0 11.5 - 15.5 %   Platelets 380 150 - 400 K/uL   nRBC 0.0 0.0 - 0.2 %  Comprehensive metabolic panel     Status: Abnormal   Collection Time: 10/10/20 10:35 AM  Result Value Ref Range   Sodium 136 135 - 145 mmol/L   Potassium 3.5 3.5 - 5.1 mmol/L   Chloride 103 98 - 111 mmol/L   CO2 23 22 - 32 mmol/L   Glucose, Bld 87 70 - 99 mg/dL   BUN 7 6 - 20 mg/dL   Creatinine, Ser 0.80 0.44 - 1.00 mg/dL   Calcium 9.1 8.9 - 10.3 mg/dL   Total Protein 7.2 6.5 - 8.1 g/dL   Albumin 3.2 (L) 3.5 - 5.0 g/dL   AST 33 15 - 41 U/L   ALT 61 (H) 0 - 44 U/L   Alkaline Phosphatase 103 38 - 126 U/L   Total Bilirubin 0.5 0.3 - 1.2 mg/dL   GFR, Estimated >60 >60 mL/min   Anion gap 10 5 - 15  Wet prep, genital     Status: Abnormal   Collection Time: 10/10/20 11:07 AM   Specimen: Vaginal  Result Value Ref Range   Yeast Wet Prep HPF POC NONE SEEN NONE SEEN   Trich, Wet Prep NONE SEEN NONE SEEN   Clue Cells Wet Prep HPF POC NONE SEEN NONE SEEN   WBC, Wet Prep HPF POC MANY (A) NONE SEEN   Sperm NONE SEEN    US OB Comp Less 14 Wks  Result Date: 10/10/2020 CLINICAL DATA:  Pelvic cramping. Gestational age by last menstrual period is 15 weeks and 4 days. EXAM: OBSTETRIC <14 WK ULTRASOUND TECHNIQUE: Transabdominal ultrasound was performed for evaluation of the gestation as well as the maternal uterus and adnexal regions. COMPARISON:  None. FINDINGS: Intrauterine gestational sac: Single Yolk sac:  Visualized Embryo:  Visualized Cardiac Activity: Visualized Heart Rate: 141 bpm CRL:   10.6 mm   7 w 1 d                  [place] EDC: 05/28/2021 Subchorionic hemorrhage:  None visualized. Maternal uterus/adnexae:  Subchorionic hemorrhage: None Right ovary: Normal containing small corpus luteum Left ovary: Normal Other :None Free fluid:  None IMPRESSION: 1. Single living intrauterine gestation with estimated gestational age of [redacted] weeks and 1 day. No complications identified. Note: The gestational age by today's ultrasound is discordant with the clinical gestational age of [redacted] weeks and 4 days. Electronically Signed   By: Kerby Moors M.D.   On: 10/10/2020 12:15   MAU Course  Procedures  MDM -r/o ectopic -UA: amber/cloudy/sm hgb/100PRO/sm leuks/rare bacteria, sending urine for culture -CBC: WNL for pregnancy -CMP: ALT 61 -[place]: single IUP, +yolk sac, +embryo, FHR 141, [redacted]w[redacted]d-hCG: pending at time of discharge -ABO: B Positive -WetPrep: WNL -GC/CT collected -pt discharged to home in stable condition  Orders Placed This Encounter  Procedures  . Wet prep, genital    Standing Status:   Standing    Number of Occurrences:   1  . Culture, OB Urine    Standing Status:   Standing    Number of Occurrences:   1  . [place] Comp Less 14 Wks    Standing Status:   Standing    Number of Occurrences:   1    Order  Specific Question:   Symptom/Reason for Exam    Answer:   Pelvic cramping [194712]  . CBC    Standing Status:   Standing    Number of Occurrences:   1  . Comprehensive metabolic panel    Standing Status:   Standing    Number of Occurrences:   1  . hCG, quantitative, pregnancy    Standing Status:   Standing    Number of Occurrences:   1  . Urinalysis, Routine w reflex microscopic    Standing Status:   Standing    Number of Occurrences:   1  . Discharge patient    Order Specific Question:   Discharge disposition    Answer:   01-Home or Self Care [1]    Order Specific Question:   Discharge patient date    Answer:   10/10/2020   No orders of the defined types were placed in this encounter.  Assessment and Plan   1. Intrauterine pregnancy   2. Pelvic cramping   3. [redacted] weeks gestation of pregnancy    4. Elevated ALT measurement     Allergies as of 10/10/2020      Reactions   Pollen Extract    Runny nose      Medication List    STOP taking these medications   ibuprofen 600 MG tablet Commonly known as: ADVIL     TAKE these medications   acetaminophen 325 MG tablet Commonly known as: Tylenol Take 2 tablets (650 mg total) by mouth every 4 (four) hours as needed (for pain scale < 4).   albuterol 108 (90 Base) MCG/ACT inhaler Commonly known as: VENTOLIN HFA Inhale into the lungs every 6 (six) hours as needed.   ascorbic acid 250 MG tablet Commonly known as: VITAMIN C Take 1 tablet (250 mg total) by mouth every other day. Take with iron.   Blood Pressure Monitor Kit 1 Device by Does not apply route once a week. To be monitored Regularly at home.   ferrous sulfate 325 (65 FE) MG tablet Take 1 tablet (325 mg total) by mouth every other day.   fluticasone 44 MCG/ACT inhaler Commonly known as: FLOVENT HFA Inhale into the lungs 2 (two) times daily.   loratadine 10 MG tablet Commonly known as: CLARITIN Take 10 mg by mouth daily.   Mometasone Furo-Formoterol Fum 50-5 MCG/ACT Aero Inhale into the lungs.       -will call with culture results, if positive -safe meds in pregnancy list given -list of OB providers given, start [facility] -pt to call surgeon who she is scheduled with for surgery for breast reduction on 10/17/2020 to alert to pregnancy -return MAU precautions given -pt discharged to home in stable condition  Elmyra Ricks E Mineola Duan 10/10/2020, 12:50 PM

## 2020-10-10 NOTE — MAU Note (Signed)
Went to [facility] 'cause she wasn't feeling. Had blood work drawn.  Pt left without being seen.  Her dr called her and told her about the + preg test.  Pt is scheduled for a breast reduction on 5/3. Can't get an appt, no  One will see her because she hasn't had a period in 15 wks.  She doesn't think she is that far, to know how far along she is.   Is having a lot of cramps. No bleeding.

## 2020-10-10 NOTE — Discharge Instructions (Signed)
Obstetrics: Normal and Problem Pregnancies (7th ed., pp. 102-121). [place], [place]: Elsevier."> Textbook of Family Medicine (9th ed., pp. [phone]). [place], [place]: Elsevier Saunders.">  First Trimester of Pregnancy  The first trimester of pregnancy starts on the first day of your last menstrual period until the end of week 12. This is months 1 through 3 of pregnancy. A week after a sperm fertilizes an egg, the egg will implant into the wall of the uterus and begin to develop into a baby. By the end of 12 weeks, all the baby's organs will be formed and the baby will be 2-3 inches in size. Body changes during your first trimester Your body goes through many changes during pregnancy. The changes vary and generally return to normal after your baby is born. Physical changes  You may gain or lose weight.  Your breasts may begin to grow larger and become tender. The tissue that surrounds your nipples (areola) may become darker.  Dark spots or blotches (chloasma or mask of pregnancy) may develop on your face.  You may have changes in your hair. These can include thickening or thinning of your hair or changes in texture. Health changes  You may feel nauseous, and you may vomit.  You may have heartburn.  You may develop headaches.  You may develop constipation.  Your gums may bleed and may be sensitive to brushing and flossing. Other changes  You may tire easily.  You may urinate more often.  Your menstrual periods will stop.  You may have a loss of appetite.  You may develop cravings for certain kinds of food.  You may have changes in your emotions from day to day.  You may have more vivid and strange dreams. Follow these instructions at home: Medicines  Follow your health care provider's instructions regarding medicine use. Specific medicines may be either safe or unsafe to take during pregnancy. Do not take any medicines unless told to by your health care provider.  Take a  prenatal vitamin that contains at least 600 micrograms (mcg) of folic acid. Eating and drinking  Eat a healthy diet that includes fresh fruits and vegetables, whole grains, good sources of protein such as meat, eggs, or tofu, and low-fat dairy products.  Avoid raw meat and unpasteurized juice, milk, and cheese. These carry germs that can harm you and your baby.  If you feel nauseous or you vomit: ? Eat 4 or 5 small meals a day instead of 3 large meals. ? Try eating a few soda crackers. ? Drink liquids between meals instead of during meals.  You may need to take these actions to prevent or treat constipation: ? Drink enough fluid to keep your urine pale yellow. ? Eat foods that are high in fiber, such as beans, whole grains, and fresh fruits and vegetables. ? Limit foods that are high in fat and processed sugars, such as fried or sweet foods. Activity  Exercise only as directed by your health care provider. Most people can continue their usual exercise routine during pregnancy. Try to exercise for 30 minutes at least 5 days a week.  Stop exercising if you develop pain or cramping in the lower abdomen or lower back.  Avoid exercising if it is very hot or humid or if you are at high altitude.  Avoid heavy lifting.  If you choose to, you may have sex unless your health care provider tells you not to. Relieving pain and discomfort  Wear a good support bra to relieve breast  tenderness.  Rest with your legs elevated if you have leg cramps or low back pain.  If you develop bulging veins (varicose veins) in your legs: ? Wear support hose as told by your health care provider. ? Elevate your feet for 15 minutes, 3-4 times a day. ? Limit salt in your diet. Safety  Wear your seat belt at all times when driving or riding in a car.  Talk with your health care provider if someone is verbally or physically abusive to you.  Talk with your health care provider if you are feeling sad or have  thoughts of hurting yourself. Lifestyle  Do not use hot tubs, steam rooms, or saunas.  Do not douche. Do not use tampons or scented sanitary pads.  Do not use herbal remedies, alcohol, illegal drugs, or medicines that are not approved by your health care provider. Chemicals in these products can harm your baby.  Do not use any products that contain nicotine or tobacco, such as cigarettes, e-cigarettes, and chewing tobacco. If you need help quitting, ask your health care provider.  Avoid cat litter boxes and soil used by cats. These carry germs that can cause birth defects in the baby and possibly loss of the unborn baby (fetus) by miscarriage or stillbirth. General instructions  During routine prenatal visits in the first trimester, your health care provider will do a physical exam, perform necessary tests, and ask you how things are going. Keep all follow-up visits. This is important.  Ask for help if you have counseling or nutritional needs during pregnancy. Your health care provider can offer advice or refer you to specialists for help with various needs.  Schedule a dentist appointment. At home, brush your teeth with a soft toothbrush. Floss gently.  Write down your questions. Take them to your prenatal visits. Where to find more information  American Pregnancy Association: americanpregnancy.org  American College of Obstetricians and Gynecologists: acog.org/en/Womens%20Health/Pregnancy  Office on Women's Health: womenshealth.gov/pregnancy Contact a health care provider if you have:  Dizziness.  A fever.  Mild pelvic cramps, pelvic pressure, or nagging pain in the abdominal area.  Nausea, vomiting, or diarrhea that lasts for 24 hours or longer.  A bad-smelling vaginal discharge.  Pain when you urinate.  Known exposure to a contagious illness, such as chickenpox, measles, Zika virus, HIV, or hepatitis. Get help right away if you have:  Spotting or bleeding from your  vagina.  Severe abdominal cramping or pain.  Shortness of breath or chest pain.  Any kind of trauma, such as from a fall or a car crash.  New or increased pain, swelling, or redness in an arm or leg. Summary  The first trimester of pregnancy starts on the first day of your last menstrual period until the end of week 12 (months 1 through 3).  Eating 4 or 5 small meals a day rather than 3 large meals may help to relieve nausea and vomiting.  Do not use any products that contain nicotine or tobacco, such as cigarettes, e-cigarettes, and chewing tobacco. If you need help quitting, ask your health care provider.  Keep all follow-up visits. This is important. This information is not intended to replace advice given to you by your health care provider. Make sure you discuss any questions you have with your health care provider. Document Revised: 11/10/2019 Document Reviewed: 09/16/2019 Elsevier Patient Education  2021 Elsevier Inc.         Abdominal Pain During Pregnancy Abdominal pain is common during pregnancy and   has many possible causes. Some causes are more serious than others, and sometimes the cause is not known. Abdominal pain can be a sign that labor is starting. It can also be caused by normal growth of your baby causing stretching of muscles and ligaments during pregnancy. Always tell your health care provider if you have any abdominal pain. Follow these instructions at home:  Do not have sex or put anything in your vagina until your pain goes away completely.  Get plenty of rest until your pain improves.  Drink enough fluid to keep your urine pale yellow.  Take over-the-counter and prescription medicines only as told by your health care provider.  Keep all follow-up visits. This is important.   Contact a health care provider if:  Your pain continues or gets worse after resting.  You have lower abdominal pain that: ? Comes and goes at regular intervals. ? Spreads  to your back. ? Is similar to menstrual cramps.  You have pain or burning when you urinate. Get help right away if:  You have a fever, chills, or shortness of breath.  You have vaginal bleeding.  You are leaking fluid or passing tissue from your vagina.  You have vomiting or diarrhea that lasts for more than 24 hours.  Your baby is moving less than usual.  You feel very weak or faint.  You develop severe pain in your upper abdomen. Summary  Abdominal pain is common during pregnancy and has many possible causes.  If you experience abdominal pain during pregnancy, tell your health care provider right away.  Follow your health care provider's home care instructions and keep all follow-up visits as told. This information is not intended to replace advice given to you by your health care provider. Make sure you discuss any questions you have with your health care provider. Document Revised: 02/15/2020 Document Reviewed: 02/15/2020 Elsevier Patient Education  2021 Elsevier Inc.                        Safe Medications in Pregnancy    Acne: Benzoyl Peroxide Salicylic Acid  Backache/Headache: Tylenol: 2 regular strength every 4 hours OR              2 Extra strength every 6 hours  Colds/Coughs/Allergies: Benadryl (alcohol free) 25 mg every 6 hours as needed Breath right strips Claritin Cepacol throat lozenges Chloraseptic throat spray Cold-Eeze- up to three times per day Cough drops, alcohol free Flonase (by prescription only) Guaifenesin Mucinex Robitussin DM (plain only, alcohol free) Saline nasal spray/drops Sudafed (pseudoephedrine) & Actifed ** use only after [redacted] weeks gestation and if you do not have high blood pressure Tylenol Vicks Vaporub Zinc lozenges Zyrtec   Constipation: Colace Ducolax suppositories Fleet enema Glycerin suppositories Metamucil Milk of magnesia Miralax Senokot Smooth move tea  Diarrhea: Kaopectate Imodium A-D  *NO  pepto Bismol  Hemorrhoids: Anusol Anusol HC Preparation H Tucks  Indigestion: Tums Maalox Mylanta Zantac  Pepcid  Insomnia: Benadryl (alcohol free) 25mg  every 6 hours as needed Tylenol PM Unisom, no Gelcaps  Leg Cramps: Tums MagGel  Nausea/Vomiting:  Bonine Dramamine Emetrol Ginger extract Sea bands Meclizine  Nausea medication to take during pregnancy:  Unisom (doxylamine succinate 25 mg tablets) Take one tablet daily at bedtime. If symptoms are not adequately controlled, the dose can be increased to a maximum recommended dose of two tablets daily (1/2 tablet in the morning, 1/2 tablet mid-afternoon and one at bedtime). Vitamin B6 100mg  tablets. Take  one tablet twice a day (up to 200 mg per day).  Skin Rashes: Aveeno products Benadryl cream or 25mg  every 6 hours as needed Calamine Lotion 1% cortisone cream  Yeast infection: Gyne-lotrimin 7 Monistat 7   **If taking multiple medications, please check labels to avoid duplicating the same active ingredients **take medication as directed on the label ** Do not exceed 4000 mg of tylenol in 24 hours **Do not take medications that contain aspirin or ibuprofen          Prenatal Care Providers           [facility] @ [facility] - accepts patients without insurance  Phone: [phone]  [facility] for [id number] @ [facility]   Phone: [phone]  [facility] @[place]       Phone: [phone]            [facility] @ [place]     Phone: [phone]          [facility] for [place] @ [phone]   Phone: [phone]  [facility] for [facility] @ [facility] - accepts patients without insurance  Phone: [phone]  [facility] for [facility] @ [facility] Phone: [phone]     [facility] - accepts patients without insurance Phone: [phone]  [facility]  Phone: [phone]  [place]  [facility] Phone: [phone]  [facility] Phone: [phone]  [facility] Phone: [phone]  [facility] Phone: [phone]  [facility]  Phone: [phone]

## 2020-10-11 LAB — GC/CHLAMYDIA PROBE AMP (~~LOC~~) NOT AT ARMC
Chlamydia: NEGATIVE
Comment: NEGATIVE
Comment: NORMAL
Neisseria Gonorrhea: NEGATIVE

## 2020-10-11 LAB — CULTURE, OB URINE

## 2020-10-17 ENCOUNTER — Ambulatory Visit (HOSPITAL_BASED_OUTPATIENT_CLINIC_OR_DEPARTMENT_OTHER): Admission: RE | Admit: 2020-10-17 | Payer: Medicaid Other | Source: Home / Self Care | Admitting: Plastic Surgery

## 2020-10-17 ENCOUNTER — Encounter (HOSPITAL_BASED_OUTPATIENT_CLINIC_OR_DEPARTMENT_OTHER): Admission: RE | Payer: Self-pay | Source: Home / Self Care

## 2020-10-17 SURGERY — MAMMOPLASTY, REDUCTION
Anesthesia: General | Site: Breast | Laterality: Bilateral

## 2020-10-18 ENCOUNTER — Other Ambulatory Visit: Payer: Self-pay

## 2020-10-18 ENCOUNTER — Inpatient Hospital Stay (HOSPITAL_COMMUNITY)
Admission: AD | Admit: 2020-10-18 | Discharge: 2020-10-18 | Disposition: A | Discharge status: Home / Self Care | Payer: Medicaid Other | Attending: Obstetrics & Gynecology | Admitting: Obstetrics & Gynecology

## 2020-10-18 DIAGNOSIS — Z7951 Long term (current) use of inhaled steroids: Secondary | ICD-10-CM | POA: Insufficient documentation

## 2020-10-18 DIAGNOSIS — O26891 Other specified pregnancy related conditions, first trimester: Secondary | ICD-10-CM | POA: Insufficient documentation

## 2020-10-18 DIAGNOSIS — R109 Unspecified abdominal pain: Secondary | ICD-10-CM

## 2020-10-18 DIAGNOSIS — Z3A08 8 weeks gestation of pregnancy: Secondary | ICD-10-CM | POA: Insufficient documentation

## 2020-10-18 DIAGNOSIS — R102 Pelvic and perineal pain: Secondary | ICD-10-CM | POA: Insufficient documentation

## 2020-10-18 DIAGNOSIS — O26899 Other specified pregnancy related conditions, unspecified trimester: Secondary | ICD-10-CM

## 2020-10-18 LAB — WET PREP, GENITAL
Clue Cells Wet Prep HPF POC: NONE SEEN
Sperm: NONE SEEN
Trich, Wet Prep: NONE SEEN
Yeast Wet Prep HPF POC: NONE SEEN

## 2020-10-18 NOTE — MAU Provider Note (Signed)
Patient Nicole Schaefer is a 20 y.o. G2P1001  at 38w2dby early [place] She denies vaginal discharge, no vaginal bleeding, dysuria, pain with intercourse. She endorses cramping that started 2 days ago. She denies fever, SOB, chest pain, vomiting, diarrhea, constipation.   Patient had a NSVD on 03-21-2020. She denies history of high blood pressure, diabetes. She had a confirmed IUD on 10/10/2020.   History     CSN: [id number] Arrival date and time: 10/18/20 1920   None     Chief Complaint  Patient presents with  . Abdominal Pain   Abdominal Pain This is a new problem. The current episode started in the past 7 days. The problem occurs intermittently. The pain is located in the suprapubic region. The pain is at a severity of 7/10. The quality of the pain is cramping. The abdominal pain does not radiate. Pertinent negatives include no constipation, diarrhea, dysuria, fever, nausea or vomiting. Nothing aggravates the pain. The pain is relieved by nothing.    OB History    Gravida  2   Para  1   Term  1   Preterm      AB      Living  1     SAB      IAB      Ectopic      Multiple  0   Live Births  1           Past Medical History:  Diagnosis Date  . Asthma    has been a while since inhaler/neb use   . Varicose vein of leg     Past Surgical History:  Procedure Laterality Date  . LEG SURGERY Left     Family History  Problem Relation Age of Onset  . Healthy Mother   . Healthy Father   . Hypertension Father   . Hyperlipidemia Father   . Sleep apnea Father   . Diabetes Paternal Grandmother   . Heart attack Paternal Grandmother     Social History   Tobacco Use  . Smoking status: Never Smoker  . Smokeless tobacco: Never Used  Vaping Use  . Vaping Use: Never used  Substance Use Topics  . Alcohol use: No  . Drug use: No    Allergies:  Allergies  Allergen Reactions  . Pollen Extract     Runny nose    Medications Prior to Admission  Medication Sig  Dispense Refill Last Dose  . acetaminophen (TYLENOL) 325 MG tablet Take 2 tablets (650 mg total) by mouth every 4 (four) hours as needed (for pain scale < 4). 60 tablet 0   . albuterol (VENTOLIN HFA) 108 (90 Base) MCG/ACT inhaler Inhale into the lungs every 6 (six) hours as needed.     . Blood Pressure Monitor KIT 1 Device by Does not apply route once a week. To be monitored Regularly at home. 1 kit 0   . ferrous sulfate 325 (65 FE) MG tablet Take 1 tablet (325 mg total) by mouth every other day. 30 tablet 1   . fluticasone (FLOVENT HFA) 44 MCG/ACT inhaler Inhale into the lungs 2 (two) times daily.     .Marland Kitchenloratadine (CLARITIN) 10 MG tablet Take 10 mg by mouth daily.     . Mometasone Furo-Formoterol Fum 50-5 MCG/ACT AERO Inhale into the lungs.     . vitamin C (VITAMIN C) 250 MG tablet Take 1 tablet (250 mg total) by mouth every other day. Take with iron. 30 tablet 1  Review of Systems  Constitutional: Negative for fever.  Gastrointestinal: Positive for abdominal pain. Negative for constipation, diarrhea, nausea and vomiting.  Genitourinary: Negative for dysuria.   Physical Exam   Blood pressure 121/68, pulse 90, temperature 98.4 F (36.9 C), resp. rate 16, height 5' 3"  (1.6 m), weight 97.1 kg, last menstrual period 06/23/2020, not currently breastfeeding.  Physical Exam Abdominal:     General: Abdomen is flat. Bowel sounds are normal.     Palpations: Abdomen is soft.     Tenderness: There is no abdominal tenderness.  Neurological:     Mental Status: She is alert.     MAU Course  Procedures  MDM -Patient had limited visit in MAU due to high acuity census in MAU.    Pt informed that the ultrasound is considered a limited OB ultrasound and is not intended to be a complete ultrasound exam.  Patient also informed that the ultrasound is not being completed with the intent of assessing for fetal or placental anomalies or any pelvic abnormalities.  Explained that the purpose of today's  ultrasound is to assess for  viability. Cardaic activity at 160 noted.  Patient acknowledges the purpose of the exam and the limitations of the study.    -patient denies any need for vaginal cultures; does not want vaginal exam or bimanual  Assessment and Plan   1. Abdominal cramping   2. Abdominal pain affecting pregnancy   -reassured patient of normalcy of cramping in early pregnancy; return to MAU if worsens/changes or bleeding. First trimester precautions given.  -Keep Appt on May 10th  [place] 10/18/2020, 9:12 PM

## 2020-10-18 NOTE — MAU Note (Addendum)
Luna Kitchens CNM in room to talk with pt. CNM gave d/c instructions and pt d/c home

## 2020-10-18 NOTE — MAU Note (Signed)
Abd cramping for a couple of days. Was concerned so wanted to get it checked out. Denies VB or d/c

## 2020-10-19 LAB — GC/CHLAMYDIA PROBE AMP (~~LOC~~) NOT AT ARMC
Chlamydia: NEGATIVE
Comment: NEGATIVE
Comment: NORMAL
Neisseria Gonorrhea: NEGATIVE

## 2020-10-24 ENCOUNTER — Ambulatory Visit (INDEPENDENT_AMBULATORY_CARE_PROVIDER_SITE_OTHER): Payer: Medicaid Other

## 2020-10-24 DIAGNOSIS — Z3481 Encounter for supervision of other normal pregnancy, first trimester: Secondary | ICD-10-CM

## 2020-10-24 DIAGNOSIS — Z3491 Encounter for supervision of normal pregnancy, unspecified, first trimester: Secondary | ICD-10-CM | POA: Insufficient documentation

## 2020-10-24 DIAGNOSIS — Z3480 Encounter for supervision of other normal pregnancy, unspecified trimester: Secondary | ICD-10-CM

## 2020-10-24 DIAGNOSIS — Z3A00 Weeks of gestation of pregnancy not specified: Secondary | ICD-10-CM

## 2020-10-24 NOTE — Progress Notes (Signed)
New OB Intake  I connected with  Freddie Saiz on 10/24/20 at  9:00 AM EDT by telephone and verified that I am speaking with the correct person using two identifiers. Nurse is located at [facility] and pt is located at Work.  I discussed the limitations, risks, security and privacy concerns of performing an evaluation and management service by telephone and the availability of in person appointments. I also discussed with the patient that there may be a patient responsible charge related to this service. The patient expressed understanding and agreed to proceed.  I explained I am completing New OB Intake today. We discussed her EDD of 05/28/21 that is based on early u/s on 10/10/20. Pt is G2/P1. I reviewed her allergies, medications, Medical/Surgical/OB history, and appropriate screenings. I informed her of [facility] services. Based on history, this is a/an uncomplicated pregnancy.  Patient Active Problem List   Diagnosis Date Noted  . Elevated ALT measurement 10/10/2020  . History of gestational hypertension 04/18/2020  . 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 Perennial allergic rhinitis 08/30/2015    Concerns addressed today  Delivery Plans:  Plans to deliver at [facility].   MyChart/Babyscripts MyChart access verified. I explained pt will have some visits in office and some virtually. Babyscripts instructions given and order placed. Patient verifies receipt of registration text/e-mail. Account successfully created and app downloaded.  Blood Pressure Cuff Patient has a BP cuff at home from last pregnancy. Explained after first prenatal appt pt will check weekly and document in Babyscripts.  Anatomy US Explained first scheduled [place] will be around 19 weeks.  Labs Discussed Avelina Laine genetic screening with patient. Would like both Panorama and Horizon drawn at new OB visit. Routine prenatal labs needed.  Covid Vaccine Patient has not covid vaccine.    Social Determinants of Health . Food Insecurity: Patient denies food insecurity. . WIC Referral: Patient is interested in referral to [facility].  . Transportation: Patient denies transportation needs. . Childcare: Discussed no children allowed at ultrasound appointments. Offered childcare services; patient declines childcare services at this time.  First visit review I reviewed new OB appt with pt. I explained she will have a pelvic exam, ob bloodwork with genetic screening, and PAP smear. Explained pt will be seen by Donia Ast at first visit; encounter routed to appropriate provider. Explained that patient will be seen by pregnancy navigator following visit with provider.  Hamilton Capri, RN 10/24/2020  8:42 AM

## 2020-11-08 ENCOUNTER — Other Ambulatory Visit (HOSPITAL_COMMUNITY)
Admission: RE | Admit: 2020-11-08 | Discharge: 2020-11-08 | Disposition: A | Discharge status: Home / Self Care | Payer: Medicaid Other | Source: Ambulatory Visit | Attending: Women's Health | Admitting: Women's Health

## 2020-11-08 ENCOUNTER — Other Ambulatory Visit: Payer: Self-pay

## 2020-11-08 ENCOUNTER — Ambulatory Visit (INDEPENDENT_AMBULATORY_CARE_PROVIDER_SITE_OTHER): Payer: Medicaid Other | Admitting: Women's Health

## 2020-11-08 VITALS — BP 110/67 | HR 97 | Wt 218.0 lb

## 2020-11-08 DIAGNOSIS — Z8759 Personal history of other complications of pregnancy, childbirth and the puerperium: Secondary | ICD-10-CM

## 2020-11-08 DIAGNOSIS — O99211 Obesity complicating pregnancy, first trimester: Secondary | ICD-10-CM

## 2020-11-08 DIAGNOSIS — R102 Pelvic and perineal pain: Secondary | ICD-10-CM | POA: Diagnosis present

## 2020-11-08 DIAGNOSIS — R7401 Elevation of levels of liver transaminase levels: Secondary | ICD-10-CM

## 2020-11-08 DIAGNOSIS — R7303 Prediabetes: Secondary | ICD-10-CM

## 2020-11-08 DIAGNOSIS — Q2730 Arteriovenous malformation, site unspecified: Secondary | ICD-10-CM

## 2020-11-08 DIAGNOSIS — Z3481 Encounter for supervision of other normal pregnancy, first trimester: Secondary | ICD-10-CM | POA: Diagnosis not present

## 2020-11-08 DIAGNOSIS — J45990 Exercise induced bronchospasm: Secondary | ICD-10-CM

## 2020-11-08 MED ORDER — ASPIRIN EC 81 MG PO TBEC: 81.0000 mg | DELAYED_RELEASE_TABLET | Freq: Every day | ORAL | 7 refills | Status: DC

## 2020-11-08 NOTE — Progress Notes (Signed)
History:   Nicole Schaefer is a 20 y.o. G2P1001 at 64w2dby early ultrasound being seen today for her first obstetrical visit.  Her obstetrical history is significant for obesity amd hx gHTN. Patient does intend to breast feed. Pregnancy history fully reviewed.  Allergies: pollen, no meds Current Medications: Tylenol, loratadine, albuterol PRN, flovent PRN PMH: asthma - no hospitalizations, has not used in past year. No HTN, DM. PSH: leg surgery for AVM, 2016. Pt reports she was cured after surgery. OB Hx: 2021 (full-term, NSVD, gHTN) Social Hx: pt does not smoke, drink, or use drugs. Family Hx: none  Patient reports no complaints.      HISTORY: OB History  Gravida Para Term Preterm AB Living  2 1 1  0 0 1  SAB IAB Ectopic Multiple Live Births  0 0 0 0 1    # Outcome Date GA Lbr Len/2nd Weight Sex Delivery Anes PTL Lv  2 Current           1 Term 03/21/20 [id number]:05 / 00:09 6 lb 7.2 oz (2.925 kg) M Vag-Spont EPI  LIV     Name: Finchum,BOY Desire     Apgar1: 9  Apgar5: 9    Last pap smear was done n/a age 89 Past Medical History:  Diagnosis Date  . Asthma    has been a while since inhaler/neb use   . Varicose vein of leg    Past Surgical History:  Procedure Laterality Date  . LEG SURGERY Left    Family History  Problem Relation Age of Onset  . Healthy Mother   . Healthy Father   . Hypertension Father   . Hyperlipidemia Father   . Sleep apnea Father   . Diabetes Paternal Grandmother   . Heart attack Paternal Grandmother    Social History   Tobacco Use  . Smoking status: Never Smoker  . Smokeless tobacco: Never Used  Vaping Use  . Vaping Use: Never used  Substance Use Topics  . Alcohol use: No  . Drug use: No   Allergies  Allergen Reactions  . Pollen Extract     Runny nose   Current Outpatient Medications on File Prior to Visit  Medication Sig Dispense Refill  . acetaminophen (TYLENOL) 325 MG tablet Take 2 tablets (650 mg total) by mouth every 4  (four) hours as needed (for pain scale < 4). 60 tablet 0  . albuterol (VENTOLIN HFA) 108 (90 Base) MCG/ACT inhaler Inhale into the lungs every 6 (six) hours as needed.    . Blood Pressure Monitor KIT 1 Device by Does not apply route once a week. To be monitored Regularly at home. 1 kit 0  . fluticasone (FLOVENT HFA) 44 MCG/ACT inhaler Inhale into the lungs 2 (two) times daily.    . Marland Kitchenoratadine (CLARITIN) 10 MG tablet Take 10 mg by mouth daily.    . Mometasone Furo-Formoterol Fum 50-5 MCG/ACT AERO Inhale into the lungs.    . Prenatal Vit-Fe Fumarate-FA (PRENATAL VITAMINS) 28-0.8 MG TABS Take by mouth.     No current facility-administered medications on file prior to visit.    Review of Systems Pertinent items noted in HPI and remainder of comprehensive ROS otherwise negative.  Physical Exam:   Vitals:   11/08/20 1400  BP: 110/67  Pulse: 97  Weight: 218 lb (98.9 kg)   Fetal Heart Rate (bpm): 170  General: well-developed, well-nourished female in no acute distress  Breasts:  normal appearance, no masses or tenderness bilaterally  Skin: normal coloration and turgor, no rashes  Neurologic: oriented, normal, negative, normal mood  Extremities: normal strength, tone, and muscle mass, ROM of all joints is normal  HEENT PERRLA, extraocular movement intact and sclera clear, anicteric  Neck supple and no masses  Cardiovascular: regular rate and rhythm  Respiratory:  no respiratory distress, normal breath sounds  Abdomen: soft, non-tender; bowel sounds normal; no masses,  no organomegaly  Pelvic: not indicated      Assessment:    Pregnancy: G2P1001 Patient Active Problem List   Diagnosis Date Noted  . Encounter for supervision of normal pregnancy in first trimester 10/24/2020  . Elevated ALT measurement 10/10/2020  . History of gestational hypertension 04/18/2020  . 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 Perennial allergic rhinitis  08/30/2015     Plan:    1. Encounter for supervision of other normal pregnancy in first trimester - Genetic Screening - Obstetric Panel, Including HIV - Hepatitis C Antibody - Urine Culture  2. Arteriovenous malformation  -leg surgery for AVM, 2016. Pt reports she was cured after surgery.  3. Exercise induced bronchospasm -pt reports has not used inhaler in over a year, pt reports her asthma does come at times other than just times associated with exercise  4. History of gestational hypertension - BP today 110/67 - Comp Met (CMET) - Protein / creatinine ratio, urine  5. Elevated ALT measurement - Comp Met (CMET)  6. Pelvic cramping - Cervicovaginal ancillary only( [place])  7. Obesity affecting pregnancy in first trimester - pt to start ASA at 12 weeks - aspirin EC 81 MG tablet; Take 1 tablet (81 mg total) by mouth daily. Swallow whole.  Dispense: 30 tablet; Refill: 7   Initial labs drawn. Continue prenatal vitamins. Problem list reviewed and updated. Genetic Screening discussed, NIPS: ordered. Ultrasound discussed; fetal anatomic survey: ordered. Anticipatory guidance about prenatal visits given including labs, ultrasounds, and testing. Discussed usage of Babyscripts and virtual visits as additional source of managing and completing prenatal visits in midst of coronavirus and pandemic.   Encouraged to complete MyChart Registration for her ability to review results, send requests, and have questions addressed.  The nature of [place] [facility] with multiple MDs and Advanced Practice Providers was explained to patient; also emphasized that residents, students are part of our team. Routine obstetric precautions reviewed. Encouraged to seek out care at office or emergency room [facility] MAU preferred) for urgent and/or emergent concerns. Return in about 4 weeks (around 12/06/2020) for in-person LOB/APP OK.     Clarisa Fling, NP   3:04 PM 11/08/2020

## 2020-11-08 NOTE — Patient Instructions (Signed)
Maternity Assessment Unit (MAU)  The Maternity Assessment Unit (MAU) is located at the [facility] and [place] [facility]. The address is: [address], [place], [place], [place] [address]. Please see map below for additional directions.    The Maternity Assessment Unit is designed to help you during your pregnancy, and for up to 6 weeks after delivery, with any pregnancy- or postpartum-related emergencies, if you think you are in labor, or if your water has broken. For example, if you experience nausea and vomiting, vaginal bleeding, severe abdominal or pelvic pain, elevated blood pressure or other problems related to your pregnancy or postpartum time, please come to the Maternity Assessment Unit for assistance.        Preterm Labor The normal length of a pregnancy is 39-41 weeks. Preterm labor is when labor starts before 37 completed weeks of pregnancy. Babies who are born prematurely and survive may not be fully developed and may be at an increased risk for long-term problems such as cerebral palsy, developmental delays, and vision and hearing problems. Babies who are born too early may have problems soon after birth. Problems may include regulating blood sugar, body temperature, heart rate, and breathing rate. These babies often have trouble with feeding. The risk of having problems is highest for babies who are born before 44 weeks of pregnancy. What are the causes? The exact cause of this condition is not known. What increases the risk? You are more likely to have preterm labor if you have certain risk factors that relate to your medical history, problems with present and past pregnancies, and lifestyle factors. Medical history  You have abnormalities of the uterus, including a short cervix.  You have STIs (sexually transmitted infections), or other infections of the urinary tract and the vagina.  You have chronic illnesses, such as blood clotting problems,  diabetes, or high blood pressure.  You are overweight or underweight. Present and past pregnancies  You have had preterm labor before.  You are pregnant with twins or other multiples.  You have been diagnosed with a condition in which the placenta covers your cervix (placenta previa).  You waited less than 6 months between giving birth and becoming pregnant again.  Your unborn baby has some abnormalities.  You have vaginal bleeding during pregnancy.  You became pregnant through in vitro fertilization (IVF). Lifestyle and environmental factors  You use tobacco products.  You drink alcohol.  You use street drugs.  You have stress and no social support.  You experience domestic violence.  You are exposed to certain chemicals or environmental pollutants. Other factors  You are younger than age 110 or older than age 19. What are the signs or symptoms? Symptoms of this condition include:  Cramps similar to those that can happen during a menstrual period. The cramps may happen with diarrhea.  Pain in the abdomen or lower back.  Regular contractions that may feel like tightening of the abdomen.  A feeling of increased pressure in the pelvis.  Increased watery or bloody mucus discharge from the vagina.  Water breaking (ruptured amniotic sac). How is this diagnosed? This condition is diagnosed based on:  Your medical history and a physical exam.  A pelvic exam.  An ultrasound.  Monitoring your uterus for contractions.  Other tests, including: ? A swab of the cervix to check for a chemical called fetal fibronectin. ? Urine tests. How is this treated? Treatment for this condition depends on the length of your pregnancy, your  condition, and the health of your baby. Treatment may include:  Taking medicines, such as: ? Hormone medicines. These may be given early in pregnancy to help support the pregnancy. ? Medicines to stop contractions. ? Medicines to help mature  the baby's lungs. These may be prescribed if the risk of delivery is high. ? Medicines to prevent your baby from developing cerebral palsy.  Bed rest. If the labor happens before 34 weeks of pregnancy, you may need to stay in the hospital.  Delivery of the baby. Follow these instructions at home:  Do not use any products that contain nicotine or tobacco, such as cigarettes, e-cigarettes, and chewing tobacco. If you need help quitting, ask your health care provider.  Do not drink alcohol.  Take over-the-counter and prescription medicines only as told by your health care provider.  Rest as told by your health care provider.  Return to your normal activities as told by your health care provider. Ask your health care provider what activities are safe for you.  Keep all follow-up visits as told by your health care provider. This is important.   How is this prevented? To increase your chance of having a full-term pregnancy:  Do not use street drugs or medicines that have not been prescribed to you during your pregnancy.  Talk with your health care provider before taking any herbal supplements, even if you have been taking them regularly.  Make sure you gain a healthy amount of weight during your pregnancy.  Watch for infection. If you think that you might have an infection, get it checked right away. Symptoms of infection may include: ? Fever. ? Abnormal vaginal discharge or discharge that smells bad. ? Pain or burning with urination. ? Needing to urinate urgently. ? Frequently urinating or passing small amounts of urine frequently. ? Blood in your urine. ? Urine that smells bad or unusual.  Tell your health care provider if you have had preterm labor before. Contact a health care provider if:  You think you are going into preterm labor.  You have signs or symptoms of preterm labor.  You have symptoms of infection. Get help right away if:  You are having regular, painful  contractions every 5 minutes or less.  Your water breaks. Summary  Preterm labor is labor that starts before you reach 37 weeks of pregnancy.  Delivering your baby early increases your baby's risk of developing lifelong problems.  The exact cause of preterm labor is unknown. However, having an abnormal uterus, an STI (sexually transmitted infection), or vaginal bleeding during pregnancy increases your risk for preterm labor.  Keep all follow-up visits as told by your health care provider. This is important.  Contact a health care provider if you have signs or symptoms of preterm labor. This information is not intended to replace advice given to you by your health care provider. Make sure you discuss any questions you have with your health care provider. Document Revised: 07/06/2019 Document Reviewed: 07/06/2019 Elsevier Patient Education  2021 [employer].

## 2020-11-08 NOTE — Progress Notes (Signed)
New OB c/o headaches and cramps 6/10 x 2 weeks.

## 2020-11-09 LAB — COMPREHENSIVE METABOLIC PANEL
ALT: 15 IU/L (ref 0–32)
AST: 18 IU/L (ref 0–40)
Albumin/Globulin Ratio: 1.1 — ABNORMAL LOW (ref 1.2–2.2)
Albumin: 3.9 g/dL (ref 3.9–5.0)
Alkaline Phosphatase: 115 IU/L — ABNORMAL HIGH (ref 42–106)
BUN/Creatinine Ratio: 11 (ref 9–23)
BUN: 8 mg/dL (ref 6–20)
Bilirubin Total: <0.2 mg/dL (ref 0.0–1.2)
CO2: 20 mmol/L (ref 20–29)
Calcium: 9.1 mg/dL (ref 8.7–10.2)
Chloride: 102 mmol/L (ref 96–106)
Creatinine, Ser: 0.7 mg/dL (ref 0.57–1.00)
Globulin, Total: 3.5 g/dL (ref 1.5–4.5)
Glucose: 97 mg/dL (ref 65–99)
Potassium: 3.6 mmol/L (ref 3.5–5.2)
Sodium: 136 mmol/L (ref 134–144)
Total Protein: 7.4 g/dL (ref 6.0–8.5)
eGFR: 128 mL/min/{1.73_m2} (ref >59)

## 2020-11-09 LAB — OBSTETRIC PANEL, INCLUDING HIV
Antibody Screen: NEGATIVE
Basophils Absolute: 0 10*3/uL (ref 0.0–0.2)
Basos: 0 %
EOS (ABSOLUTE): 0.1 10*3/uL (ref 0.0–0.4)
Eos: 1 %
HIV Screen 4th Generation wRfx: NONREACTIVE
Hematocrit: 36.2 % (ref 34.0–46.6)
Hemoglobin: 11.4 g/dL (ref 11.1–15.9)
Hepatitis B Surface Ag: NEGATIVE
Immature Grans (Abs): 0 10*3/uL (ref 0.0–0.1)
Immature Granulocytes: 0 %
Lymphocytes Absolute: 2.1 10*3/uL (ref 0.7–3.1)
Lymphs: 27 %
MCH: 24.4 pg — ABNORMAL LOW (ref 26.6–33.0)
MCHC: 31.5 g/dL (ref 31.5–35.7)
MCV: 77 fL — ABNORMAL LOW (ref 79–97)
Monocytes Absolute: 0.4 10*3/uL (ref 0.1–0.9)
Monocytes: 6 %
Neutrophils Absolute: 5.1 10*3/uL (ref 1.4–7.0)
Neutrophils: 66 %
Platelets: 354 10*3/uL (ref 150–450)
RBC: 4.68 x10E6/uL (ref 3.77–5.28)
RDW: 15.6 % — ABNORMAL HIGH (ref 11.7–15.4)
RPR Ser Ql: NONREACTIVE
Rh Factor: POSITIVE
Rubella Antibodies, IGG: 2.98 index (ref >0.99)
WBC: 7.7 10*3/uL (ref 3.4–10.8)

## 2020-11-09 LAB — CERVICOVAGINAL ANCILLARY ONLY
Candida Glabrata: NEGATIVE
Candida Vaginitis: NEGATIVE
Chlamydia: NEGATIVE
Comment: NEGATIVE
Comment: NEGATIVE
Comment: NEGATIVE
Comment: NEGATIVE
Comment: NORMAL
Neisseria Gonorrhea: NEGATIVE
Trichomonas: NEGATIVE

## 2020-11-09 LAB — HEMOGLOBIN A1C
Est. average glucose Bld gHb Est-mCnc: 117 mg/dL
Hgb A1c MFr Bld: 5.7 % — ABNORMAL HIGH (ref 4.8–5.6)

## 2020-11-09 LAB — PROTEIN / CREATININE RATIO, URINE
Creatinine, Urine: 165.8 mg/dL
Protein, Ur: 24.8 mg/dL
Protein/Creat Ratio: 150 mg/g creat (ref 0–200)

## 2020-11-09 LAB — HEPATITIS C ANTIBODY: Hep C Virus Ab: <0.1 s/co ratio (ref 0.0–0.9)

## 2020-11-10 LAB — URINE CULTURE

## 2020-11-12 NOTE — Progress Notes (Signed)
Good morning!  Please call pt to schedule for lab-only visit for early 2hr GTT as soon as possible. Patient has been notified of results via MyChart.  Thank you, [name]

## 2020-11-20 ENCOUNTER — Encounter: Payer: Self-pay | Admitting: Women's Health

## 2020-11-24 ENCOUNTER — Other Ambulatory Visit: Payer: Medicaid Other

## 2020-11-24 ENCOUNTER — Other Ambulatory Visit: Payer: Self-pay

## 2020-11-24 DIAGNOSIS — Z3481 Encounter for supervision of other normal pregnancy, first trimester: Secondary | ICD-10-CM

## 2020-11-25 LAB — GLUCOSE TOLERANCE, 2 HOURS W/ 1HR
Glucose, 1 hour: 74 mg/dL (ref 65–179)
Glucose, 2 hour: 97 mg/dL (ref 65–152)
Glucose, Fasting: 87 mg/dL (ref 65–91)

## 2020-11-29 ENCOUNTER — Telehealth: Payer: Self-pay

## 2020-11-29 NOTE — Telephone Encounter (Signed)
Entered in error

## 2020-12-04 ENCOUNTER — Encounter: Payer: Medicaid Other | Admitting: Licensed Clinical Social Worker

## 2020-12-04 ENCOUNTER — Telehealth: Payer: Self-pay | Admitting: Licensed Clinical Social Worker

## 2020-12-04 ENCOUNTER — Telehealth: Payer: Self-pay

## 2020-12-04 NOTE — Telephone Encounter (Signed)
Called pt regarding scheduled mychart visit. Unable to leave voicemail due to to mailbox is full

## 2020-12-04 NOTE — Telephone Encounter (Addendum)
Spoke with pt c/o HA's. Pt denies visual disturbances and dizziness.  Pt is unable to take BP at this time. She will get BP cuff from her dad and call back. Pt also c/o low abdominal pain.  Denies VB, LOF, dysuria.  Last dose of Tylenol was yesterday. Pt to continue taking Tylenol for pain.  If sx's worsens, call the office during business hours or report to MAU.

## 2020-12-04 NOTE — Telephone Encounter (Signed)
Pt c/b with BP results 111/66, denies edema, denies visual disturbances, denies dizziness. +FM Consulted with Dr. Debroah Loop, it is not the first choice but since pt is not getting relief from  Ex Strength Tylenol, she can take OTC IB 400 mg and rotate it with Tylenol. We will re-assess on Wed during her appt.  If sx's worsens, call the office during business hours or report to MAU.

## 2020-12-06 ENCOUNTER — Other Ambulatory Visit: Payer: Self-pay

## 2020-12-06 ENCOUNTER — Ambulatory Visit (INDEPENDENT_AMBULATORY_CARE_PROVIDER_SITE_OTHER): Payer: Medicaid Other | Admitting: Women's Health

## 2020-12-06 VITALS — BP 111/74 | HR 97 | Wt 214.0 lb

## 2020-12-06 DIAGNOSIS — Z3A15 15 weeks gestation of pregnancy: Secondary | ICD-10-CM

## 2020-12-06 DIAGNOSIS — R7303 Prediabetes: Secondary | ICD-10-CM

## 2020-12-06 DIAGNOSIS — R519 Headache, unspecified: Secondary | ICD-10-CM

## 2020-12-06 DIAGNOSIS — Q2730 Arteriovenous malformation, site unspecified: Secondary | ICD-10-CM

## 2020-12-06 DIAGNOSIS — O26892 Other specified pregnancy related conditions, second trimester: Secondary | ICD-10-CM | POA: Insufficient documentation

## 2020-12-06 DIAGNOSIS — R7401 Elevation of levels of liver transaminase levels: Secondary | ICD-10-CM

## 2020-12-06 DIAGNOSIS — Z3481 Encounter for supervision of other normal pregnancy, first trimester: Secondary | ICD-10-CM

## 2020-12-06 DIAGNOSIS — O26893 Other specified pregnancy related conditions, third trimester: Secondary | ICD-10-CM | POA: Insufficient documentation

## 2020-12-06 DIAGNOSIS — Z8759 Personal history of other complications of pregnancy, childbirth and the puerperium: Secondary | ICD-10-CM

## 2020-12-06 MED ORDER — METOCLOPRAMIDE HCL 10 MG PO TABS: 10.0000 mg | ORAL_TABLET | Freq: Three times a day (TID) | ORAL | 1 refills | Status: DC | PRN

## 2020-12-06 MED ORDER — CYCLOBENZAPRINE HCL 10 MG PO TABS: 10.0000 mg | ORAL_TABLET | Freq: Three times a day (TID) | ORAL | 1 refills | Status: DC | PRN

## 2020-12-06 NOTE — Progress Notes (Signed)
Subjective:  Nicole Schaefer is a 20 y.o. G2P1001 at [redacted]w[redacted]d being seen today for ongoing prenatal care.  She is currently monitored for the following issues for this low-risk pregnancy and has Prediabetes; Arteriovenous malformation; Exercise induced bronchospasm; Perennial allergic rhinitis; History of gestational hypertension; Elevated ALT measurement; Encounter for supervision of normal pregnancy in first trimester; and Pregnancy headache in second trimester on their problem list.  Patient reports headache.  Contractions: Not present. Vag. Bleeding: None.   . Denies leaking of fluid.   The following portions of the patient's history were reviewed and updated as appropriate: allergies, current medications, past family history, past medical history, past social history, past surgical history and problem list. Problem list updated.  Objective:   Vitals:   12/06/20 0845  BP: 111/74  Pulse: 97  Weight: 214 lb (97.1 kg)    Fetal Status: Fetal Heart Rate (bpm): 155         General:  Alert, oriented and cooperative. Patient is in no acute distress.  Skin: Skin is warm and dry. No rash noted.   Cardiovascular: Normal heart rate noted  Respiratory: Normal respiratory effort, no problems with respiration noted  Abdomen: Soft, gravid, appropriate for gestational age. Pain/Pressure: Present     Pelvic: Vag. Bleeding: None     Cervical exam deferred        Extremities: Normal range of motion.     Mental Status: Normal mood and affect. Normal behavior. Normal judgment and thought content.   Urinalysis:      Assessment and Plan:  Pregnancy: G2P1001 at [redacted]w[redacted]d  1. Encounter for supervision of other normal pregnancy in first trimester - CBE info given - AFP next visit, pt declines today  2. Elevated ALT measurement -normal at NOB  3. History of gestational hypertension -BP today 111/74  4. Prediabetes -early 2hr normal  5. Arteriovenous malformation -leg surgery for AVM, 2016. Pt reports  she was cured of AVM after surgery.  6. [redacted] weeks gestation of pregnancy  7. Pregnancy headache in second trimester - pt reports daily HA for past 2 weeks - states she has been taking Tylenol which will take the HA away, but will return in about 3-4 hours - pt was advised to take ibuprofen, but did not - see AVS for additional information - pt advised to call office if no relief of HA by tomorrow for RX of Fioricet - pt advised can be referred to HA clinic if necessary - pt advised to be seen in MAU for worsening/severe headache - metoCLOPramide (REGLAN) 10 MG tablet; Take 1 tablet (10 mg total) by mouth every 8 (eight) hours as needed.  Dispense: 30 tablet; Refill: 1 - cyclobenzaprine (FLEXERIL) 10 MG tablet; Take 1 tablet (10 mg total) by mouth every 8 (eight) hours as needed for muscle spasms.  Dispense: 30 tablet; Refill: 1   Preterm labor symptoms and general obstetric precautions including but not limited to vaginal bleeding, contractions, leaking of fluid and fetal movement were reviewed in detail with the patient. I discussed the assessment and treatment plan with the patient. The patient was provided an opportunity to ask questions and all were answered. The patient agreed with the plan and demonstrated an understanding of the instructions. The patient was advised to call back or seek an in-person office evaluation/go to MAU at [facility] for any urgent or concerning symptoms. Please refer to After Visit Summary for other counseling recommendations.  Return in about 4 weeks (around 01/03/2021) for in-person  LOB/AFP/APP OK.   Yarlin Breisch, [name], NP

## 2020-12-06 NOTE — Patient Instructions (Addendum)
Maternity Assessment Unit (MAU)  The Maternity Assessment Unit (MAU) is located at the [facility] at [facility]. The address is: [address], [place], [place], [place] [address]. Please see map below for additional directions.    The Maternity Assessment Unit is designed to help you during your pregnancy, and for up to 6 weeks after delivery, with any pregnancy- or postpartum-related emergencies, if you think you are in labor, or if your water has broken. For example, if you experience nausea and vomiting, vaginal bleeding, severe abdominal or pelvic pain, elevated blood pressure or other problems related to your pregnancy or postpartum time, please come to the Maternity Assessment Unit for assistance.       For prevention of migraines in pregnancy: -Magnesium, 400mg  by mouth, once daily -Vitamin B2, 400mg  by mouth, once daily  For treatment of migraines in pregnancy: -take medication at the first sign of the pain of a headache, or the first sign of your aura -start with 1000mg  Tylenol (do not exceed 4000mg  of Tylenol in 24hrs), with Reglan 10mg  -if no relief after 1-2hours, can take Flexeril 10mg  -if headache is severe and not relieved by the above, please call office or go to MAU       Alpha-Fetoprotein Test Why am I having this test? The alpha-fetoprotein test is a lab test most commonly used for pregnant women to help screen for birth defects in their unborn baby. It can be used to screen for chromosome (DNA) abnormalities, problems with the brain or spinal cord, or problems with the abdominal wall of the unborn baby (fetus). The alpha-fetoprotein test may also be done for men or nonpregnant women tocheck for certain cancers. What is being tested? This test measures the amount of alpha-fetoprotein (AFP) in your blood. AFP is a protein that is made by the liver. Levels can be detected in the mother's blood during pregnancy, starting at 10  weeks and peaking at 16-18 weeks of the pregnancy. Abnormal levels can sometimes be a sign of a birth defect in thebaby. Certain cancers can cause a high level of AFP in men and nonpregnant women. What kind of sample is taken?  A blood sample is required for this test. It is usually collected by insertinga needle into a blood vessel. How are the results reported? Your test results will be reported as values. Your health care provider will compare your results to normal ranges that were established after testing a large group of people (reference values). Reference values may vary among labs and hospitals. For this test, common reference values are: Adult: Less than 40 ng/mL or less than 40 mcg/L (SI units). Child younger than 1 year: Less than 30 ng/mL. If you are pregnant, the values may also vary based on how long you have beenpregnant. What do the results mean? Results that are above the reference values in pregnant women may indicate the following for the baby: Neural tube defects, such as abnormalities of the spinal cord or brain. Abdominal wall defects. Multiple pregnancy such as twins. Fetal distress or fetal death. Results that are above the reference values in men or nonpregnant women may indicate: Reproductive cancers, such as ovarian or testicular cancer. Liver cancer. Liver cell death. Other types of cancer. Very low levels of AFP in pregnant women may indicate Down syndrome for thebaby. Talk with your health care provider about what your results mean. Questions to ask your health care provider Ask your health care provider, or the department  that is doing the test: When will my results be ready? How will I get my results? What are my treatment options? What other tests do I need? What are my next steps? Summary The alpha-fetoprotein test is done on pregnant women to help screen for birth defects in their unborn baby. Certain cancers can cause a high level of AFP in men  and nonpregnant women. For this test, a blood sample is usually collected by inserting a needle into a blood vessel. Talk with your health care provider about what your results mean. This information is not intended to replace advice given to you by your health care provider. Make sure you discuss any questions you have with your healthcare provider. Document Revised: 12/24/2019 Document Reviewed: 12/24/2019 Elsevier Patient Education  2022 [employer].       Second Trimester of Pregnancy  The second trimester of pregnancy is from week 13 through week 27. This is months 4 through 6 of pregnancy. The second trimester is often a time when you feel your best. Your body has adjusted to being pregnant, and you begin to feelbetter physically. During the second trimester: Morning sickness has lessened or stopped completely. You may have more energy. You may have an increase in appetite. The second trimester is also a time when the unborn baby (fetus) is growing rapidly. At the end of the sixth month, the fetus may be up to 12 inches long and weigh about 1 pounds. You will likely begin to feel the baby move (quickening) between 16 and 20 weeks of pregnancy. Body changes during your second trimester Your body continues to go through many changes during your second trimester.The changes vary and generally return to normal after the baby is born. Physical changes Your weight will continue to increase. You will notice your lower abdomen bulging out. You may begin to get stretch marks on your hips, abdomen, and breasts. Your breasts will continue to grow and to become tender. Dark spots or blotches (chloasma or mask of pregnancy) may develop on your face. A dark line from your belly button to the pubic area (linea nigra) may appear. You may have changes in your hair. These can include thickening of your hair, rapid growth, and changes in texture. Some people also have hair loss during or after  pregnancy, or hair that feels dry or thin. Health changes You may develop headaches. You may have heartburn. You may develop constipation. You may develop hemorrhoids or swollen, bulging veins (varicose veins). Your gums may bleed and may be sensitive to brushing and flossing. You may urinate more often because the fetus is pressing on your bladder. You may have back pain. This is caused by: Weight gain. Pregnancy hormones that are relaxing the joints in your pelvis. A shift in weight and the muscles that support your balance. Follow these instructions at home: Medicines Follow your health care provider's instructions regarding medicine use. Specific medicines may be either safe or unsafe to take during pregnancy. Do not take any medicines unless approved by your health care provider. Take a prenatal vitamin that contains at least 600 micrograms (mcg) of folic acid. Eating and drinking Eat a healthy diet that includes fresh fruits and vegetables, whole grains, good sources of protein such as meat, eggs, or tofu, and low-fat dairy products. Avoid raw meat and unpasteurized juice, milk, and cheese. These carry germs that can harm you and your baby. You may need to take these actions to prevent or treat constipation: Drink enough  fluid to keep your urine pale yellow. Eat foods that are high in fiber, such as beans, whole grains, and fresh fruits and vegetables. Limit foods that are high in fat and processed sugars, such as fried or sweet foods. Activity Exercise only as directed by your health care provider. Most people can continue their usual exercise routine during pregnancy. Try to exercise for 30 minutes at least 5 days a week. Stop exercising if you develop contractions in your uterus. Stop exercising if you develop pain or cramping in the lower abdomen or lower back. Avoid exercising if it is very hot or humid or if you are at a high altitude. Avoid heavy lifting. If you choose to,  you may have sex unless your health care provider tells you not to. Relieving pain and discomfort Wear a supportive bra to prevent discomfort from breast tenderness. Take warm sitz baths to soothe any pain or discomfort caused by hemorrhoids. Use hemorrhoid cream if your health care provider approves. Rest with your legs raised (elevated) if you have leg cramps or low back pain. If you develop varicose veins: Wear support hose as told by your health care provider. Elevate your feet for 15 minutes, 3-4 times a day. Limit salt in your diet. Safety Wear your seat belt at all times when driving or riding in a car. Talk with your health care provider if someone is verbally or physically abusive to you. Lifestyle Do not use hot tubs, steam rooms, or saunas. Do not douche. Do not use tampons or scented sanitary pads. Avoid cat litter boxes and soil used by cats. These carry germs that can cause birth defects in the baby and possibly loss of the fetus by miscarriage or stillbirth. Do not use herbal remedies, alcohol, illegal drugs, or medicines that are not approved by your health care provider. Chemicals in these products can harm your baby. Do not use any products that contain nicotine or tobacco, such as cigarettes, e-cigarettes, and chewing tobacco. If you need help quitting, ask your health care provider. General instructions During a routine prenatal visit, your health care provider will do a physical exam and other tests. He or she will also discuss your overall health. Keep all follow-up visits. This is important. Ask your health care provider for a referral to a local prenatal education class. Ask for help if you have counseling or nutritional needs during pregnancy. Your health care provider can offer advice or refer you to specialists for help with various needs. Where to find more information American Pregnancy Association: americanpregnancy.org [employer] of Obstetricians and  Gynecologists: https://www.todd-brady.net/ Office on [employer] Health: MightyReward.co.nz Contact a health care provider if you have: A headache that does not go away when you take medicine. Vision changes or you see spots in front of your eyes. Mild pelvic cramps, pelvic pressure, or nagging pain in the abdominal area. Persistent nausea, vomiting, or diarrhea. A bad-smelling vaginal discharge or foul-smelling urine. Pain when you urinate. Sudden or extreme swelling of your face, hands, ankles, feet, or legs. A fever. Get help right away if you: Have fluid leaking from your vagina. Have spotting or bleeding from your vagina. Have severe abdominal cramping or pain. Have difficulty breathing. Have chest pain. Have fainting spells. Have not felt your baby move for the time period told by your health care provider. Have new or increased pain, swelling, or redness in an arm or leg. Summary The second trimester of pregnancy is from week 13 through week 27 (months 4  through 6). Do not use herbal remedies, alcohol, illegal drugs, or medicines that are not approved by your health care provider. Chemicals in these products can harm your baby. Exercise only as directed by your health care provider. Most people can continue their usual exercise routine during pregnancy. Keep all follow-up visits. This is important. This information is not intended to replace advice given to you by your health care provider. Make sure you discuss any questions you have with your healthcare provider. Document Revised: 11/10/2019 Document Reviewed: 09/16/2019 Elsevier Patient Education  2022 Elsevier Inc.       Preterm Labor The normal length of a pregnancy is 39-41 weeks. Preterm labor is when labor starts before 37 completed weeks of pregnancy. Babies who are born prematurely and survive may not be fully developed and may be at an increased risk for long-term problems such as cerebral palsy,  developmental delays, and vision andhearing problems. Babies who are born too early may have problems soon after birth. Premature babies may have problems regulating blood sugar, body temperature, heart rate, and breathing rate. These babies often have trouble with feeding. The risk ofhaving problems is highest for babies who are born before 34 weeks of pregnancy. What are the causes? The exact cause of this condition is not known. What increases the risk? You are more likely to have preterm labor if you have certain risk factors that relate to your medical history, problems with present and past pregnancies, andlifestyle factors. Medical history You have abnormalities of the uterus, including a short cervix. You have STIs (sexually transmitted infections) or other infections of the urinary tract and the vagina. You have chronic illnesses, such as blood clotting problems, diabetes, or high blood pressure. You are overweight or underweight. Present and past pregnancies You have had preterm labor before. You are pregnant with twins or other multiples. You have been diagnosed with a condition in which the placenta covers your cervix (placenta previa). You waited less than 18 months between giving birth and becoming pregnant again. Your unborn baby has some abnormalities. You have vaginal bleeding during pregnancy. You became pregnant through in vitro fertilization (IVF). Lifestyle and environmental factors You use tobacco products or drink alcohol. You use drugs. You have stress and no social support. You experience domestic violence. You are exposed to certain chemicals or environmental pollutants. Other factors You are younger than age 47 or older than age 64. What are the signs or symptoms? Symptoms of this condition include: Cramps similar to those that can happen during a menstrual period. The cramps may happen with diarrhea. Pain in the abdomen or lower back. Regular contractions  that may feel like tightening of the abdomen. A feeling of increased pressure in the pelvis. Increased watery or bloody mucus discharge from the vagina. Water breaking (ruptured amniotic sac). How is this diagnosed? This condition is diagnosed based on: Your medical history and a physical exam. A pelvic exam. An ultrasound. Monitoring your uterus for contractions. Other tests, including: A swab of the cervix to check for a chemical called fetal fibronectin. Urine tests. How is this treated? Treatment for this condition depends on the length of your pregnancy, your condition, and the health of your baby. Treatment may include: Taking medicines, such as: Hormone medicines. These may be given early in pregnancy to help support the pregnancy. Medicines to stop contractions. Medicines to help mature the baby's lungs. These may be prescribed if the risk of delivery is high. Medicines to help protect your baby from  brain and nerve complications such as cerebral palsy. Bed rest. If the labor happens before 34 weeks of pregnancy, you may need to stay in the hospital. Delivery of the baby. Follow these instructions at home:  Do not use any products that contain nicotine or tobacco. These products include cigarettes, chewing tobacco, and vaping devices, such as e-cigarettes. If you need help quitting, ask your health care provider. Do not drink alcohol. Take over-the-counter and prescription medicines only as told by your health care provider. Rest as told by your health care provider. Return to your normal activities as told by your health care provider. Ask your health care provider what activities are safe for you. Keep all follow-up visits. This is important. How is this prevented? To increase your chance of having a full-term pregnancy: Do not use drugs or take medicines that have not been prescribed to you during your pregnancy. Talk with your health care provider before taking any herbal  supplements, even if you have been taking them regularly. Make sure you gain a healthy amount of weight during your pregnancy. Watch for infection. If you think that you might have an infection, get it checked right away. Symptoms of infection may include: Fever. Abnormal vaginal discharge or discharge that smells bad. Pain or burning with urination. Needing to urinate urgently. Frequently urinating or passing small amounts of urine frequently. Blood in your urine or urine that smells bad or unusual. Where to find more information U.S. Department of Health and CytogeneticistHuman Services Office on Women's Health: http://hoffman.com/www.womenshealth.gov [employer] of Obstetricians and Gynecologists: www.acog.org Centers for Disease Control and Prevention, Preterm Birth: FootballExhibition.com.brwww.cdc.gov Contact a health care provider if: You think you are going into preterm labor. You have signs or symptoms of preterm labor. You have symptoms of infection. Get help right away if: You are having regular, painful contractions every 5 minutes or less. Your water breaks. Summary Preterm labor is labor that starts before you reach 37 weeks of pregnancy. Delivering your baby early increases your baby's risk of developing long-term problems. You are more likely to have preterm labor if you have certain risk factors that relate to your medical history, problems with present and past pregnancies, and lifestyle factors. Keep all follow-up visits. This is important. Contact a health care provider if you have signs or symptoms of preterm labor. This information is not intended to replace advice given to you by your health care provider. Make sure you discuss any questions you have with your healthcare provider. Document Revised: 06/06/2020 Document Reviewed: 06/06/2020 Elsevier Patient Education  2022 [employer]

## 2020-12-06 NOTE — Progress Notes (Signed)
Pt states she has ongoing HA x 1-2 weeks,  Some relief with Tylenol use.  Pt denies any visual changes.  Pt also complains of some pelvic pressure.

## 2020-12-07 ENCOUNTER — Other Ambulatory Visit: Payer: Self-pay

## 2020-12-07 DIAGNOSIS — Z3481 Encounter for supervision of other normal pregnancy, first trimester: Secondary | ICD-10-CM

## 2020-12-13 ENCOUNTER — Inpatient Hospital Stay (HOSPITAL_COMMUNITY)
Admission: AD | Admit: 2020-12-13 | Discharge: 2020-12-13 | Discharge status: Against Medical Advice | Payer: Medicaid Other | Attending: Obstetrics and Gynecology | Admitting: Obstetrics and Gynecology

## 2020-12-13 ENCOUNTER — Other Ambulatory Visit: Payer: Self-pay

## 2020-12-13 DIAGNOSIS — Z3481 Encounter for supervision of other normal pregnancy, first trimester: Secondary | ICD-10-CM

## 2020-12-13 LAB — URINALYSIS, ROUTINE W REFLEX MICROSCOPIC
Bilirubin Urine: NEGATIVE
Glucose, UA: NEGATIVE mg/dL
Hgb urine dipstick: NEGATIVE
Ketones, ur: NEGATIVE mg/dL
Nitrite: NEGATIVE
Protein, ur: NEGATIVE mg/dL
Specific Gravity, Urine: 1.017 (ref 1.005–1.030)
pH: 6 (ref 5.0–8.0)

## 2020-12-13 NOTE — MAU Note (Signed)
Pt wanting to leave unable to stay anymore. Instructed her she could return to MAU at anytime or follow up with her OB office.  AMA form signed.

## 2020-12-13 NOTE — MAU Note (Signed)
Presents with c/o lower abdominal pain for last 2 weeks.  Denies VB.  Reports informed MD, but "no one seems to be listening.  I asked for ultrasound to check on the baby but I haven't had one yet".

## 2020-12-19 ENCOUNTER — Other Ambulatory Visit: Payer: Self-pay | Admitting: Women's Health

## 2020-12-19 ENCOUNTER — Other Ambulatory Visit: Payer: Self-pay | Admitting: *Deleted

## 2020-12-19 DIAGNOSIS — R519 Headache, unspecified: Secondary | ICD-10-CM

## 2020-12-19 NOTE — Telephone Encounter (Signed)
Pt called to office needing refill on Reglan Rx. Pt states she has taken initial Rx and just refilled but is worried she will not be able to get more once this refill is taken, Pt is requesting additional refills be sent to pharmacy.   Pt made aware message to provider for review and approval. Rx will be sent if approved.     Please review and advise.

## 2020-12-20 NOTE — Telephone Encounter (Signed)
[name], [name], NP  [name], CMA Caller: Unspecified Nicole Schaefer,  1:53 PM) She can always message [place] for refills, but I think the right thing to do her is referring her to the headache clinic to have additional management if she is using the Reglan frequently. I put in the order if you wouldn't mind just letting her know. And when she is down to 7 pills of Reglan she can message [place] for a refill, but I'm hoping she can be seen by the headache clinic before then and get some better treatment.   Thank you,  [name]          Call to pt making her aware of recommendations and need for appt with HA specialist. Advised she should receive call to be scheduled.

## 2020-12-28 ENCOUNTER — Telehealth: Payer: Self-pay

## 2020-12-28 NOTE — Telephone Encounter (Signed)
Pt has refill on file per Beth at [facility].

## 2020-12-28 NOTE — Telephone Encounter (Signed)
Pt called needs rf on Reglan. She should have one refill left at the pharmacy. Conference call with pharmacy - pt disconnected the call while waiting for pharmacy. Will contact pt back once I get the pharmacy on the line.

## 2021-01-02 ENCOUNTER — Other Ambulatory Visit: Payer: Self-pay | Admitting: Women's Health

## 2021-01-02 ENCOUNTER — Other Ambulatory Visit: Payer: Self-pay

## 2021-01-02 ENCOUNTER — Ambulatory Visit: Payer: Medicaid Other | Attending: Women's Health

## 2021-01-02 ENCOUNTER — Other Ambulatory Visit: Payer: Self-pay | Admitting: *Deleted

## 2021-01-02 DIAGNOSIS — Z3481 Encounter for supervision of other normal pregnancy, first trimester: Secondary | ICD-10-CM | POA: Diagnosis not present

## 2021-01-02 DIAGNOSIS — Z362 Encounter for other antenatal screening follow-up: Secondary | ICD-10-CM

## 2021-01-03 ENCOUNTER — Ambulatory Visit (INDEPENDENT_AMBULATORY_CARE_PROVIDER_SITE_OTHER): Payer: Medicaid Other | Admitting: Obstetrics

## 2021-01-03 ENCOUNTER — Encounter: Payer: Self-pay | Admitting: Obstetrics

## 2021-01-03 VITALS — BP 116/74 | HR 92 | Wt 210.4 lb

## 2021-01-03 DIAGNOSIS — O99210 Obesity complicating pregnancy, unspecified trimester: Secondary | ICD-10-CM

## 2021-01-03 DIAGNOSIS — Z8759 Personal history of other complications of pregnancy, childbirth and the puerperium: Secondary | ICD-10-CM

## 2021-01-03 DIAGNOSIS — Z3480 Encounter for supervision of other normal pregnancy, unspecified trimester: Secondary | ICD-10-CM

## 2021-01-03 NOTE — Progress Notes (Signed)
Pt reports fetal movement with some pelvic pressure. 

## 2021-01-03 NOTE — Progress Notes (Signed)
Patient presents for ROB. 

## 2021-01-03 NOTE — Progress Notes (Signed)
Pt left w/o getting labs.

## 2021-01-03 NOTE — Progress Notes (Deleted)
ROB [redacted]w[redacted]d

## 2021-01-03 NOTE — Progress Notes (Signed)
Subjective:  Nicole Schaefer is a 20 y.o. G2P1001 at [redacted]w[redacted]d being seen today for ongoing prenatal care.  She is currently monitored for the following issues for this low-risk pregnancy and has Prediabetes; Arteriovenous malformation; Exercise induced bronchospasm; Perennial allergic rhinitis; History of gestational hypertension; Elevated ALT measurement; Encounter for supervision of normal pregnancy in first trimester; and Pregnancy headache in second trimester on their problem list.  Patient reports  pelvic pressure .  Contractions: Not present. Vag. Bleeding: None.  Movement: Present. Denies leaking of fluid.   The following portions of the patient's history were reviewed and updated as appropriate: allergies, current medications, past family history, past medical history, past social history, past surgical history and problem list. Problem list updated.  Objective:   Vitals:   01/03/21 0925  BP: 116/74  Pulse: 92  Weight: 210 lb 6.4 oz (95.4 kg)    Fetal Status:     Movement: Present     General:  Alert, oriented and cooperative. Patient is in no acute distress.  Skin: Skin is warm and dry. No rash noted.   Cardiovascular: Normal heart rate noted  Respiratory: Normal respiratory effort, no problems with respiration noted  Abdomen: Soft, gravid, appropriate for gestational age. Pain/Pressure: Present     Pelvic:  Cervical exam deferred        Extremities: Normal range of motion.  Edema: None  Mental Status: Normal mood and affect. Normal behavior. Normal judgment and thought content.   Urinalysis:      Assessment and Plan:  Pregnancy: G2P1001 at [redacted]w[redacted]d  1. Supervision of other normal pregnancy, antepartum  2. History of gestational hypertension  3. Obesity affecting pregnancy, antepartum    Preterm labor symptoms and general obstetric precautions including but not limited to vaginal bleeding, contractions, leaking of fluid and fetal movement were reviewed in detail with the  patient. Please refer to After Visit Summary for other counseling recommendations.   Return in about 4 weeks (around 01/31/2021) for ROB.   Brock Bad, MD  01/03/21

## 2021-01-04 ENCOUNTER — Other Ambulatory Visit: Payer: Self-pay

## 2021-01-04 ENCOUNTER — Other Ambulatory Visit: Payer: Medicaid Other

## 2021-01-04 DIAGNOSIS — Z3481 Encounter for supervision of other normal pregnancy, first trimester: Secondary | ICD-10-CM

## 2021-01-06 LAB — AFP, SERUM, OPEN SPINA BIFIDA
AFP MoM: 1.15
AFP Value: 53.8 ng/mL
Gest. Age on Collection Date: 19 weeks
Maternal Age At EDD: 20.5 yr
OSBR Risk 1 IN: 10000
Test Results:: NEGATIVE
Weight: 210 [lb_av]

## 2021-01-22 ENCOUNTER — Telehealth: Payer: Self-pay

## 2021-01-22 NOTE — Telephone Encounter (Signed)
TC from pt requesting Rx for back pain be sent to [facility] on [address].   Please advise.

## 2021-01-23 ENCOUNTER — Other Ambulatory Visit: Payer: Self-pay

## 2021-01-23 DIAGNOSIS — O99891 Other specified diseases and conditions complicating pregnancy: Secondary | ICD-10-CM

## 2021-01-23 MED ORDER — CYCLOBENZAPRINE HCL 10 MG PO TABS: 10.0000 mg | ORAL_TABLET | Freq: Three times a day (TID) | ORAL | 2 refills | Status: DC | PRN

## 2021-01-23 NOTE — Progress Notes (Signed)
Pt requested Rx for back pain while pregnant. "Ok" per Dr.Harper to send in flexeril 10mg  pt made aware and voiced understanding and pharmacy confirmed.

## 2021-01-24 ENCOUNTER — Other Ambulatory Visit: Payer: Self-pay | Admitting: Obstetrics

## 2021-01-30 ENCOUNTER — Ambulatory Visit: Payer: Medicaid Other | Attending: Maternal & Fetal Medicine

## 2021-01-30 ENCOUNTER — Ambulatory Visit: Payer: Medicaid Other | Admitting: *Deleted

## 2021-01-30 ENCOUNTER — Other Ambulatory Visit: Payer: Self-pay | Admitting: *Deleted

## 2021-01-30 ENCOUNTER — Encounter: Payer: Self-pay | Admitting: *Deleted

## 2021-01-30 ENCOUNTER — Other Ambulatory Visit: Payer: Self-pay

## 2021-01-30 DIAGNOSIS — O09292 Supervision of pregnancy with other poor reproductive or obstetric history, second trimester: Secondary | ICD-10-CM

## 2021-01-30 DIAGNOSIS — Z362 Encounter for other antenatal screening follow-up: Secondary | ICD-10-CM | POA: Diagnosis present

## 2021-01-30 DIAGNOSIS — Z6837 Body mass index (BMI) 37.0-37.9, adult: Secondary | ICD-10-CM

## 2021-01-30 DIAGNOSIS — O99212 Obesity complicating pregnancy, second trimester: Secondary | ICD-10-CM

## 2021-01-30 DIAGNOSIS — E669 Obesity, unspecified: Secondary | ICD-10-CM

## 2021-01-30 DIAGNOSIS — O99891 Other specified diseases and conditions complicating pregnancy: Secondary | ICD-10-CM

## 2021-01-30 DIAGNOSIS — R7303 Prediabetes: Secondary | ICD-10-CM | POA: Diagnosis not present

## 2021-01-30 DIAGNOSIS — Z3A23 23 weeks gestation of pregnancy: Secondary | ICD-10-CM

## 2021-01-30 DIAGNOSIS — Z363 Encounter for antenatal screening for malformations: Secondary | ICD-10-CM

## 2021-01-31 ENCOUNTER — Ambulatory Visit (INDEPENDENT_AMBULATORY_CARE_PROVIDER_SITE_OTHER): Payer: Medicaid Other | Admitting: Women's Health

## 2021-01-31 VITALS — BP 116/71 | HR 93 | Wt 212.0 lb

## 2021-01-31 DIAGNOSIS — R519 Headache, unspecified: Secondary | ICD-10-CM

## 2021-01-31 DIAGNOSIS — Z3481 Encounter for supervision of other normal pregnancy, first trimester: Secondary | ICD-10-CM

## 2021-01-31 DIAGNOSIS — O26892 Other specified pregnancy related conditions, second trimester: Secondary | ICD-10-CM

## 2021-01-31 DIAGNOSIS — R7303 Prediabetes: Secondary | ICD-10-CM

## 2021-01-31 DIAGNOSIS — Z8759 Personal history of other complications of pregnancy, childbirth and the puerperium: Secondary | ICD-10-CM

## 2021-01-31 DIAGNOSIS — Z3A23 23 weeks gestation of pregnancy: Secondary | ICD-10-CM

## 2021-01-31 NOTE — Progress Notes (Signed)
Subjective:  Nicole Schaefer is a 20 y.o. G2P1001 at [redacted]w[redacted]d being seen today for ongoing prenatal care.  She is currently monitored for the following issues for this low-risk pregnancy and has Prediabetes; Arteriovenous malformation; Exercise induced bronchospasm; Perennial allergic rhinitis; History of gestational hypertension; Elevated ALT measurement; Encounter for supervision of normal pregnancy in first trimester; and Pregnancy headache in second trimester on their problem list.  Patient reports no complaints.  Contractions: Not present. Vag. Bleeding: None.  Movement: Present. Denies leaking of fluid.   The following portions of the patient's history were reviewed and updated as appropriate: allergies, current medications, past family history, past medical history, past social history, past surgical history and problem list. Problem list updated.  Objective:   Vitals:   01/31/21 0852  BP: 116/71  Pulse: 93  Weight: 212 lb (96.2 kg)    Fetal Status: Fetal Heart Rate (bpm): 145   Movement: Present     General:  Alert, oriented and cooperative. Patient is in no acute distress.  Skin: Skin is warm and dry. No rash noted.   Cardiovascular: Normal heart rate noted  Respiratory: Normal respiratory effort, no problems with respiration noted  Abdomen: Soft, gravid, appropriate for gestational age. Pain/Pressure: Present     Pelvic: Vag. Bleeding: None     Cervical exam deferred        Extremities: Normal range of motion.     Mental Status: Normal mood and affect. Normal behavior. Normal judgment and thought content.   Urinalysis:      Assessment and Plan:  Pregnancy: G2P1001 at [redacted]w[redacted]d  1. Encounter for supervision of other normal pregnancy in first trimester -GTT/labs next visit  2. History of gestational hypertension -BP today 116/71  3. Prediabetes -normal early GTT, routine GTT next visit  4. [redacted] weeks gestation of pregnancy  5. Pregnancy headache in second trimester -pt  reports about one HA weekly and Tylenol/Reglan/Flexeril are working to resolve HA for patient. Patient advised if HA increase to call office and we will have her be seen by HA specialist.  Preterm labor symptoms and general obstetric precautions including but not limited to vaginal bleeding, contractions, leaking of fluid and fetal movement were reviewed in detail with the patient. I discussed the assessment and treatment plan with the patient. The patient was provided an opportunity to ask questions and all were answered. The patient agreed with the plan and demonstrated an understanding of the instructions. The patient was advised to call back or seek an in-person office evaluation/go to MAU at [facility] for any urgent or concerning symptoms. Please refer to After Visit Summary for other counseling recommendations.  Return in about 4 weeks (around 02/28/2021) for in-person LOB/APP OK/GTT/labs.   Kjersti Dittmer, [name], NP

## 2021-01-31 NOTE — Patient Instructions (Signed)
Maternity Assessment Unit (MAU)  The Maternity Assessment Unit (MAU) is located at the [facility]. The address is: [address], [place], [place], [place] [address]. Please see map below for additional directions.    The Maternity Assessment Unit is designed to help you during your pregnancy, and for up to 6 weeks after delivery, with any pregnancy- or postpartum-related emergencies, if you think you are in labor, or if your water has broken. For example, if you experience nausea and vomiting, vaginal bleeding, severe abdominal or pelvic pain, elevated blood pressure or other problems related to your pregnancy or postpartum time, please come to the Maternity Assessment Unit for assistance.       Second Trimester of Pregnancy  The second trimester of pregnancy is from week 13 through week 27. This is months 4 through 6 of pregnancy. The second trimester is often a time when you feel your best. Your body has adjusted to being pregnant, and you begin to feelbetter physically. During the second trimester: Morning sickness has lessened or stopped completely. You may have more energy. You may have an increase in appetite. The second trimester is also a time when the unborn baby (fetus) is growing rapidly. At the end of the sixth month, the fetus may be up to 12 inches long and weigh about 1 pounds. You will likely begin to feel the baby move (quickening) between 16 and 20 weeks of pregnancy. Body changes during your second trimester Your body continues to go through many changes during your second trimester.The changes vary and generally return to normal after the baby is born. Physical changes Your weight will continue to increase. You will notice your lower abdomen bulging out. You may begin to get stretch marks on your hips, abdomen, and breasts. Your breasts will continue to grow and to become tender. Dark spots or blotches (chloasma or  mask of pregnancy) may develop on your face. A dark line from your belly button to the pubic area (linea nigra) may appear. You may have changes in your hair. These can include thickening of your hair, rapid growth, and changes in texture. Some people also have hair loss during or after pregnancy, or hair that feels dry or thin. Health changes You may develop headaches. You may have heartburn. You may develop constipation. You may develop hemorrhoids or swollen, bulging veins (varicose veins). Your gums may bleed and may be sensitive to brushing and flossing. You may urinate more often because the fetus is pressing on your bladder. You may have back pain. This is caused by: Weight gain. Pregnancy hormones that are relaxing the joints in your pelvis. A shift in weight and the muscles that support your balance. Follow these instructions at home: Medicines Follow your health care provider's instructions regarding medicine use. Specific medicines may be either safe or unsafe to take during pregnancy. Do not take any medicines unless approved by your health care provider. Take a prenatal vitamin that contains at least 600 micrograms (mcg) of folic acid. Eating and drinking Eat a healthy diet that includes fresh fruits and vegetables, whole grains, good sources of protein such as meat, eggs, or tofu, and low-fat dairy products. Avoid raw meat and unpasteurized juice, milk, and cheese. These carry germs that can harm you and your baby. You may need to take these actions to prevent or treat constipation: Drink enough fluid to keep your urine pale yellow. Eat foods that are high in fiber, such as  beans, whole grains, and fresh fruits and vegetables. Limit foods that are high in fat and processed sugars, such as fried or sweet foods. Activity Exercise only as directed by your health care provider. Most people can continue their usual exercise routine during pregnancy. Try to exercise for 30 minutes  at least 5 days a week. Stop exercising if you develop contractions in your uterus. Stop exercising if you develop pain or cramping in the lower abdomen or lower back. Avoid exercising if it is very hot or humid or if you are at a high altitude. Avoid heavy lifting. If you choose to, you may have sex unless your health care provider tells you not to. Relieving pain and discomfort Wear a supportive bra to prevent discomfort from breast tenderness. Take warm sitz baths to soothe any pain or discomfort caused by hemorrhoids. Use hemorrhoid cream if your health care provider approves. Rest with your legs raised (elevated) if you have leg cramps or low back pain. If you develop varicose veins: Wear support hose as told by your health care provider. Elevate your feet for 15 minutes, 3-4 times a day. Limit salt in your diet. Safety Wear your seat belt at all times when driving or riding in a car. Talk with your health care provider if someone is verbally or physically abusive to you. Lifestyle Do not use hot tubs, steam rooms, or saunas. Do not douche. Do not use tampons or scented sanitary pads. Avoid cat litter boxes and soil used by cats. These carry germs that can cause birth defects in the baby and possibly loss of the fetus by miscarriage or stillbirth. Do not use herbal remedies, alcohol, illegal drugs, or medicines that are not approved by your health care provider. Chemicals in these products can harm your baby. Do not use any products that contain nicotine or tobacco, such as cigarettes, e-cigarettes, and chewing tobacco. If you need help quitting, ask your health care provider. General instructions During a routine prenatal visit, your health care provider will do a physical exam and other tests. He or she will also discuss your overall health. Keep all follow-up visits. This is important. Ask your health care provider for a referral to a local prenatal education class. Ask for help if  you have counseling or nutritional needs during pregnancy. Your health care provider can offer advice or refer you to specialists for help with various needs. Where to find more information American Pregnancy Association: americanpregnancy.org [employer]necologists: https://www.todd-brady.net/ Office on [employer] Health: MightyReward.co.nz Contact a health care provider if you have: A headache that does not go away when you take medicine. Vision changes or you see spots in front of your eyes. Mild pelvic cramps, pelvic pressure, or nagging pain in the abdominal area. Persistent nausea, vomiting, or diarrhea. A bad-smelling vaginal discharge or foul-smelling urine. Pain when you urinate. Sudden or extreme swelling of your face, hands, ankles, feet, or legs. A fever. Get help right away if you: Have fluid leaking from your vagina. Have spotting or bleeding from your vagina. Have severe abdominal cramping or pain. Have difficulty breathing. Have chest pain. Have fainting spells. Have not felt your baby move for the time period told by your health care provider. Have new or increased pain, swelling, or redness in an arm or leg. Summary The second trimester of pregnancy is from week 13 through week 27 (months 4 through 6). Do not use herbal remedies, alcohol, illegal drugs, or medicines that are not approved  by your health care provider. Chemicals in these products can harm your baby. Exercise only as directed by your health care provider. Most people can continue their usual exercise routine during pregnancy. Keep all follow-up visits. This is important. This information is not intended to replace advice given to you by your health care provider. Make sure you discuss any questions you have with your healthcare provider. Document Revised: 11/10/2019 Document Reviewed: 09/16/2019 Elsevier Patient Education  2022 Elsevier Inc.       Oral  Glucose Tolerance Test During Pregnancy Why am I having this test? The oral glucose tolerance test (OGTT) is done to check how your body processes blood sugar (glucose). This is one of several tests used to diagnose diabetes that develops during pregnancy (gestational diabetes mellitus). Gestational diabetes is a short-term form of diabetes that some women develop while they are pregnant. It usually occurs during the second trimesterof pregnancy and goes away after delivery. Testing, or screening, for gestational diabetes usually occurs at weeks 24-28 of pregnancy. You may have the OGTT test after having a 1-hour glucose screening test if the results from that test indicate that you may have gestational diabetes. This test may also be needed if: You have a history of gestational diabetes. There is a history of giving birth to very large babies or of losing pregnancies (having stillbirths). You have signs and symptoms of diabetes, such as: Changes in your eyesight. Tingling or numbness in your hands or feet. Changes in hunger, thirst, and urination, and these are not explained by your pregnancy. What is being tested? This test measures the amount of glucose in your blood at different timesduring a period of 3 hours. This shows how well your body can process glucose. What kind of sample is taken?  Blood samples are required for this test. They are usually collected byinserting a needle into a blood vessel. How do I prepare for this test? For 3 days before your test, eat normally. Have plenty of carbohydrate-rich foods. Follow instructions from your health care provider about: Eating or drinking restrictions on the day of the test. You may be asked not to eat or drink anything other than water (to fast) starting 8-10 hours before the test. Changing or stopping your regular medicines. Some medicines may interfere with this test. Tell a health care provider about: All medicines you are taking,  including vitamins, herbs, eye drops, creams, and over-the-counter medicines. Any blood disorders you have. Any surgeries you have had. Any medical conditions you have. What happens during the test? First, your blood glucose will be measured. This is referred to as your fasting blood glucose because you fasted before the test. Then, you will drink a glucose solution that contains a certain amount of glucose. Your blood glucosewill be measured again 1, 2, and 3 hours after you drink the solution. This test takes about 3 hours to complete. You will need to stay at the testing location during this time. During the testing period: Do not eat or drink anything other than the glucose solution. Do not exercise. Do not use any products that contain nicotine or tobacco, such as cigarettes, e-cigarettes, and chewing tobacco. These can affect your test results. If you need help quitting, ask your health care provider. The testing procedure may vary among health care providers and hospitals. How are the results reported? Your results will be reported as milligrams of glucose per deciliter of blood (mg/dL) or millimoles per liter (mmol/L). There is more than one source  for screening and diagnosis reference values used to diagnose gestational diabetes. Your health care provider will compare your results to normal values that were established after testing a large group of people (reference values). Reference values may vary among labs and hospitals. For this test (Carpenter-Coustan), reference values are: Fasting: 95 mg/dL (5.3 mmol/L). 1 hour: 180 mg/dL (54.0 mmol/L). 2 hour: 155 mg/dL (8.6 mmol/L). 3 hour: 140 mg/dL (7.8 mmol/L). What do the results mean? Results below the reference values are considered normal. If two or more of your blood glucose levels are at or above the reference values, you may be diagnosed with gestational diabetes. If only one level is high, your healthcare provider may suggest repeat  testing or other tests to confirm a diagnosis. Talk with your health care provider about what your results mean. Questions to ask your health care provider Ask your health care provider, or the department that is doing the test: When will my results be ready? How will I get my results? What are my treatment options? What other tests do I need? What are my next steps? Summary The oral glucose tolerance test (OGTT) is one of several tests used to diagnose diabetes that develops during pregnancy (gestational diabetes mellitus). Gestational diabetes is a short-term form of diabetes that some women develop while they are pregnant. You may have the OGTT test after having a 1-hour glucose screening test if the results from that test show that you may have gestational diabetes. You may also have this test if you have any symptoms or risk factors for this type of diabetes. Talk with your health care provider about what your results mean. This information is not intended to replace advice given to you by your health care provider. Make sure you discuss any questions you have with your healthcare provider. Document Revised: 11/11/2019 Document Reviewed: 11/11/2019 Elsevier Patient Education  2022 [employer].

## 2021-02-05 DIAGNOSIS — E559 Vitamin D deficiency, unspecified: Secondary | ICD-10-CM | POA: Diagnosis not present

## 2021-02-05 DIAGNOSIS — R7303 Prediabetes: Secondary | ICD-10-CM | POA: Diagnosis not present

## 2021-02-05 DIAGNOSIS — Z0000 Encounter for general adult medical examination without abnormal findings: Secondary | ICD-10-CM | POA: Diagnosis not present

## 2021-02-05 DIAGNOSIS — Z13220 Encounter for screening for lipoid disorders: Secondary | ICD-10-CM | POA: Diagnosis not present

## 2021-02-05 DIAGNOSIS — Z3A24 24 weeks gestation of pregnancy: Secondary | ICD-10-CM | POA: Diagnosis not present

## 2021-02-05 DIAGNOSIS — R109 Unspecified abdominal pain: Secondary | ICD-10-CM | POA: Diagnosis not present

## 2021-02-10 ENCOUNTER — Encounter (HOSPITAL_COMMUNITY): Payer: Self-pay | Admitting: Obstetrics and Gynecology

## 2021-02-10 ENCOUNTER — Inpatient Hospital Stay (HOSPITAL_COMMUNITY)
Admission: AD | Admit: 2021-02-10 | Discharge: 2021-02-10 | Disposition: A | Discharge status: Home / Self Care | Payer: Medicaid Other | Attending: Obstetrics and Gynecology | Admitting: Obstetrics and Gynecology

## 2021-02-10 ENCOUNTER — Other Ambulatory Visit: Payer: Self-pay

## 2021-02-10 DIAGNOSIS — O99512 Diseases of the respiratory system complicating pregnancy, second trimester: Secondary | ICD-10-CM | POA: Insufficient documentation

## 2021-02-10 DIAGNOSIS — D649 Anemia, unspecified: Secondary | ICD-10-CM

## 2021-02-10 DIAGNOSIS — J45909 Unspecified asthma, uncomplicated: Secondary | ICD-10-CM | POA: Insufficient documentation

## 2021-02-10 DIAGNOSIS — Z79899 Other long term (current) drug therapy: Secondary | ICD-10-CM | POA: Diagnosis not present

## 2021-02-10 DIAGNOSIS — O99012 Anemia complicating pregnancy, second trimester: Secondary | ICD-10-CM | POA: Diagnosis not present

## 2021-02-10 DIAGNOSIS — Z7982 Long term (current) use of aspirin: Secondary | ICD-10-CM | POA: Insufficient documentation

## 2021-02-10 DIAGNOSIS — Z3A24 24 weeks gestation of pregnancy: Secondary | ICD-10-CM | POA: Insufficient documentation

## 2021-02-10 DIAGNOSIS — Z7951 Long term (current) use of inhaled steroids: Secondary | ICD-10-CM | POA: Insufficient documentation

## 2021-02-10 LAB — CBC
HCT: 29.7 % — ABNORMAL LOW (ref 36.0–46.0)
Hemoglobin: 9.5 g/dL — ABNORMAL LOW (ref 12.0–15.0)
MCH: 24.9 pg — ABNORMAL LOW (ref 26.0–34.0)
MCHC: 32 g/dL (ref 30.0–36.0)
MCV: 78 fL — ABNORMAL LOW (ref 80.0–100.0)
Platelets: 274 10*3/uL (ref 150–400)
RBC: 3.81 MIL/uL — ABNORMAL LOW (ref 3.87–5.11)
RDW: 15.5 % (ref 11.5–15.5)
WBC: 7.5 10*3/uL (ref 4.0–10.5)
nRBC: 0 % (ref 0.0–0.2)

## 2021-02-10 LAB — URINALYSIS, ROUTINE W REFLEX MICROSCOPIC
Bilirubin Urine: NEGATIVE
Glucose, UA: NEGATIVE mg/dL
Hgb urine dipstick: NEGATIVE
Ketones, ur: 80 mg/dL — AB
Nitrite: NEGATIVE
Protein, ur: NEGATIVE mg/dL
Specific Gravity, Urine: 1.027 (ref 1.005–1.030)
pH: 5 (ref 5.0–8.0)

## 2021-02-10 LAB — GLUCOSE, CAPILLARY: Glucose-Capillary: 66 mg/dL — ABNORMAL LOW (ref 70–99)

## 2021-02-10 MED ORDER — ACETAMINOPHEN 500 MG PO TABS
1000.0000 mg | ORAL_TABLET | Freq: Once | ORAL | Status: AC
  Administered 2021-02-10: 1000 mg via ORAL
  Filled 2021-02-10: qty 2

## 2021-02-10 MED ORDER — METOCLOPRAMIDE HCL 10 MG PO TABS
10.0000 mg | ORAL_TABLET | Freq: Once | ORAL | Status: AC
  Administered 2021-02-10: 10 mg via ORAL
  Filled 2021-02-10: qty 1

## 2021-02-10 NOTE — MAU Note (Signed)
Pt stated she felt dizzy and light headed at work this morning. Felt like she was going to "fall out" did not loose consciences . Sated she has been eating and drinking regularly just come on all of a sudden. C/o headache now. Has history of preeclampsia with previous pregnancy but no problems so far with this one. Denies any pain or cramping. Good fetal movement felt and no vag discharge or bleeding

## 2021-02-10 NOTE — Discharge Instructions (Signed)

## 2021-02-10 NOTE — MAU Provider Note (Signed)
History     CSN: [id number]  Arrival date and time: 02/10/21 1439   Event Date/Time   First Provider Initiated Contact with Patient 02/10/21 1551      Chief Complaint  Patient presents with   Near Syncope   HPI Nicole Schaefer is a 20 y.o. G2P1001 at 26w5dwho presents stating she felt like she was going to pass out at work. She works on her feet at [employer] states she felt like she was going to pass out but didn't. She also reports some dizziness. She states she ate [employer] for breakfast and a shrimp bowel for lunch and reports she feels like she is drinking enough water. She denies any abdominal pain, vaginal bleeding or leaking. She states all of the previous symptoms resolved and she now has a headache that she rates a 5/10. She reports normal fetal movement.   OB History     Gravida  2   Para  1   Term  1   Preterm      AB      Living  1      SAB      IAB      Ectopic      Multiple  0   Live Births  1           Past Medical History:  Diagnosis Date   Asthma    has been a while since inhaler/neb use    Varicose vein of leg     Past Surgical History:  Procedure Laterality Date   LEG SURGERY Left     Family History  Problem Relation Age of Onset   Healthy Mother    Healthy Father    Hypertension Father    Hyperlipidemia Father    Sleep apnea Father    Diabetes Paternal Grandmother    Heart attack Paternal Grandmother     Social History   Tobacco Use   Smoking status: Never   Smokeless tobacco: Never  Vaping Use   Vaping Use: Never used  Substance Use Topics   Alcohol use: No   Drug use: No    Allergies:  Allergies  Allergen Reactions   Pollen Extract     Runny nose    Medications Prior to Admission  Medication Sig Dispense Refill Last Dose   aspirin EC 81 MG tablet Take 1 tablet (81 mg total) by mouth daily. Swallow whole. 30 tablet 7 02/10/2021   loratadine (CLARITIN) 10 MG tablet Take 10 mg by mouth daily.    02/10/2021   Mometasone Furo-Formoterol Fum 50-5 MCG/ACT AERO Inhale into the lungs.   02/10/2021   Prenatal Vit-Fe Fumarate-FA (PRENATAL VITAMINS) 28-0.8 MG TABS Take by mouth.   02/10/2021   acetaminophen (TYLENOL) 325 MG tablet Take 2 tablets (650 mg total) by mouth every 4 (four) hours as needed (for pain scale < 4). 60 tablet 0    albuterol (VENTOLIN HFA) 108 (90 Base) MCG/ACT inhaler Inhale into the lungs every 6 (six) hours as needed.      Blood Pressure Monitor KIT 1 Device by Does not apply route once a week. To be monitored Regularly at home. 1 kit 0    cyclobenzaprine (FLEXERIL) 10 MG tablet Take 1 tablet (10 mg total) by mouth every 8 (eight) hours as needed for muscle spasms. 30 tablet 1 More than a month   cyclobenzaprine (FLEXERIL) 10 MG tablet Take 1 tablet (10 mg total) by mouth every 8 (eight) hours as needed for  muscle spasms. 30 tablet 2    fluticasone (FLOVENT HFA) 44 MCG/ACT inhaler Inhale into the lungs 2 (two) times daily.      metoCLOPramide (REGLAN) 10 MG tablet Take 1 tablet (10 mg total) by mouth every 8 (eight) hours as needed. 30 tablet 1     Review of Systems  Constitutional: Negative.  Negative for fatigue and fever.  HENT: Negative.    Respiratory: Negative.  Negative for shortness of breath.   Cardiovascular: Negative.  Negative for chest pain.  Gastrointestinal: Negative.  Negative for abdominal pain, constipation, diarrhea, nausea and vomiting.  Genitourinary: Negative.  Negative for dysuria.  Neurological:  Positive for dizziness, light-headedness and headaches.  Physical Exam   Blood pressure (!) 110/56, pulse 89, temperature 98.1 F (36.7 C), resp. rate 18, height 5' 3"  (1.6 m), weight 98.4 kg, last menstrual period 06/23/2020, not currently breastfeeding.  Physical Exam Vitals and nursing note reviewed.  Constitutional:      General: She is not in acute distress.    Appearance: She is well-developed.  HENT:     Head: Normocephalic.  Eyes:      Pupils: Pupils are equal, round, and reactive to light.  Cardiovascular:     Rate and Rhythm: Normal rate and regular rhythm.     Heart sounds: Normal heart sounds.  Pulmonary:     Effort: Pulmonary effort is normal. No respiratory distress.     Breath sounds: Normal breath sounds.  Abdominal:     General: Bowel sounds are normal. There is no distension.     Palpations: Abdomen is soft.     Tenderness: There is no abdominal tenderness.  Skin:    General: Skin is warm and dry.  Neurological:     Mental Status: She is alert and oriented to person, place, and time.  Psychiatric:        Mood and Affect: Mood normal.        Behavior: Behavior normal.        Thought Content: Thought content normal.        Judgment: Judgment normal.    MAU Course  Procedures Results for orders placed or performed during the hospital encounter of 02/10/21 (from the past 24 hour(s))  Urinalysis, Routine w reflex microscopic Urine, Clean Catch     Status: Abnormal   Collection Time: 02/10/21  3:06 PM  Result Value Ref Range   Color, Urine YELLOW YELLOW   APPearance HAZY (A) CLEAR   Specific Gravity, Urine 1.027 1.005 - 1.030   pH 5.0 5.0 - 8.0   Glucose, UA NEGATIVE NEGATIVE mg/dL   Hgb urine dipstick NEGATIVE NEGATIVE   Bilirubin Urine NEGATIVE NEGATIVE   Ketones, ur 80 (A) NEGATIVE mg/dL   Protein, ur NEGATIVE NEGATIVE mg/dL   Nitrite NEGATIVE NEGATIVE   Leukocytes,Ua TRACE (A) NEGATIVE   RBC / HPF 0-5 0 - 5 RBC/hpf   WBC, UA 0-5 0 - 5 WBC/hpf   Bacteria, UA RARE (A) NONE SEEN   Squamous Epithelial / LPF 11-20 0 - 5   Mucus PRESENT   CBC     Status: Abnormal   Collection Time: 02/10/21  3:54 PM  Result Value Ref Range   WBC 7.5 4.0 - 10.5 K/uL   RBC 3.81 (L) 3.87 - 5.11 MIL/uL   Hemoglobin 9.5 (L) 12.0 - 15.0 g/dL   HCT 29.7 (L) 36.0 - 46.0 %   MCV 78.0 (L) 80.0 - 100.0 fL   MCH 24.9 (L) 26.0 - 34.0 pg  MCHC 32.0 30.0 - 36.0 g/dL   RDW 15.5 11.5 - 15.5 %   Platelets 274 150 - 400 K/uL    nRBC 0.0 0.0 - 0.2 %  Glucose, capillary     Status: Abnormal   Collection Time: 02/10/21  4:43 PM  Result Value Ref Range   Glucose-Capillary 66 (L) 70 - 99 mg/dL   Comment 1 Notify RN    Comment 2 Document in Chart     MDM UA Offered IV fluids and patient declined CBC CBG- 66, given crackers and juice immediately. Patient declined repeat CBG Tylenol and Reglan PO- patient reports feeling better  Message sent to [facility] to set up outpatient iron transfusions  Assessment and Plan   1. Anemia during pregnancy in second trimester   2. [redacted] weeks gestation of pregnancy    -Discharge home in stable condition -Second trimester precautions discussed -Patient advised to follow-up with [facility] as scheduled for prenatal care -Patient may return to MAU as needed or if her condition were to change or worsen   Wende Mott CNM 02/10/2021, 3:51 PM

## 2021-02-13 ENCOUNTER — Other Ambulatory Visit: Payer: Self-pay | Admitting: *Deleted

## 2021-02-16 ENCOUNTER — Other Ambulatory Visit: Payer: Self-pay | Admitting: Obstetrics and Gynecology

## 2021-02-22 ENCOUNTER — Other Ambulatory Visit: Payer: Self-pay

## 2021-02-22 DIAGNOSIS — J3489 Other specified disorders of nose and nasal sinuses: Secondary | ICD-10-CM | POA: Diagnosis not present

## 2021-02-22 DIAGNOSIS — J029 Acute pharyngitis, unspecified: Secondary | ICD-10-CM | POA: Diagnosis not present

## 2021-02-28 ENCOUNTER — Ambulatory Visit (INDEPENDENT_AMBULATORY_CARE_PROVIDER_SITE_OTHER): Payer: Medicaid Other | Admitting: Women's Health

## 2021-02-28 ENCOUNTER — Other Ambulatory Visit: Payer: Medicaid Other

## 2021-02-28 ENCOUNTER — Encounter: Payer: Self-pay | Admitting: Women's Health

## 2021-02-28 ENCOUNTER — Other Ambulatory Visit: Payer: Self-pay

## 2021-02-28 VITALS — BP 116/78 | HR 90 | Wt 215.0 lb

## 2021-02-28 DIAGNOSIS — Z3481 Encounter for supervision of other normal pregnancy, first trimester: Secondary | ICD-10-CM

## 2021-02-28 DIAGNOSIS — R7401 Elevation of levels of liver transaminase levels: Secondary | ICD-10-CM

## 2021-02-28 DIAGNOSIS — Z3A27 27 weeks gestation of pregnancy: Secondary | ICD-10-CM

## 2021-02-28 DIAGNOSIS — O26892 Other specified pregnancy related conditions, second trimester: Secondary | ICD-10-CM

## 2021-02-28 DIAGNOSIS — Z8759 Personal history of other complications of pregnancy, childbirth and the puerperium: Secondary | ICD-10-CM

## 2021-02-28 DIAGNOSIS — R7303 Prediabetes: Secondary | ICD-10-CM

## 2021-02-28 DIAGNOSIS — R519 Headache, unspecified: Secondary | ICD-10-CM

## 2021-02-28 NOTE — Progress Notes (Signed)
Pt presents for ROB and 2 gtt labs. Flu vaccine and Tdap offered; pt declined.  GAD7= 0 PHQ9= 0

## 2021-02-28 NOTE — Progress Notes (Signed)
Subjective:  Nicole Schaefer is a 20 y.o. G2P1001 at [redacted]w[redacted]d being seen today for ongoing prenatal care.  She is currently monitored for the following issues for this low-risk pregnancy and has Prediabetes; Arteriovenous malformation; Exercise induced bronchospasm; Perennial allergic rhinitis; History of gestational hypertension; Elevated ALT measurement; Encounter for supervision of normal pregnancy in first trimester; and Pregnancy headache in second trimester on their problem list.  Patient reports no complaints.  Contractions: Not present. Vag. Bleeding: None.  Movement: Present. Denies leaking of fluid.   The following portions of the patient's history were reviewed and updated as appropriate: allergies, current medications, past family history, past medical history, past social history, past surgical history and problem list. Problem list updated.  Objective:   Vitals:   02/28/21 0851  BP: 116/78  Pulse: 90  Weight: 215 lb (97.5 kg)    Fetal Status: Fetal Heart Rate (bpm): 140   Movement: Present     General:  Alert, oriented and cooperative. Patient is in no acute distress.  Skin: Skin is warm and dry. No rash noted.   Cardiovascular: Normal heart rate noted  Respiratory: Normal respiratory effort, no problems with respiration noted  Abdomen: Soft, gravid, appropriate for gestational age. Pain/Pressure: Present     Pelvic: Vag. Bleeding: None     Cervical exam deferred        Extremities: Normal range of motion.  Edema: None  Mental Status: Normal mood and affect. Normal behavior. Normal judgment and thought content.   Urinalysis:      Assessment and Plan:  Pregnancy: G2P1001 at [redacted]w[redacted]d  1. Encounter for supervision of other normal pregnancy in first trimester -GTT/labs -pt alerted to iron transfusion on 03/02/2021  PHQ9 SCORE ONLY 02/28/2021 10/24/2020 09/27/2019  PHQ-9 Total Score 0 0 2   GAD 7 : Generalized Anxiety Score 02/28/2021 10/24/2020  Nervous, Anxious, on Edge 0 0   Control/stop worrying 0 0  Worry too much - different things 0 0  Trouble relaxing 0 0  Restless 0 0  Easily annoyed or irritable 0 1  Afraid - awful might happen 0 0  Total GAD 7 Score 0 1   2. Pregnancy headache in second trimester -patient reports no longer having headaches or needing to use medication.  3. Elevated ALT measurement -normal at NOB  4. History of gestational hypertension -BP today 116/78  5. Prediabetes -mildly elevated A1C, normal early 2 hour  6. [redacted] weeks gestation of pregnancy  Preterm labor symptoms and general obstetric precautions including but not limited to vaginal bleeding, contractions, leaking of fluid and fetal movement were reviewed in detail with the patient. I discussed the assessment and treatment plan with the patient. The patient was provided an opportunity to ask questions and all were answered. The patient agreed with the plan and demonstrated an understanding of the instructions. The patient was advised to call back or seek an in-person office evaluation/go to MAU at [facility] for any urgent or concerning symptoms. Please refer to After Visit Summary for other counseling recommendations.  Return in about 2 weeks (around 03/14/2021) for in-person LOB/delivering MD ONLY to discuss AVM.   Alpa Salvo, [name], NP

## 2021-02-28 NOTE — Patient Instructions (Signed)
Maternity Assessment Unit (MAU)  The Maternity Assessment Unit (MAU) is located at the [facility] and [place] at [facility]. The address is: [address] [place], [place], [place] [address]. Please see map below for additional directions.    The Maternity Assessment Unit is designed to help you during your pregnancy, and for up to 6 weeks after delivery, with any pregnancy- or postpartum-related emergencies, if you think you are in labor, or if your water has broken. For example, if you experience nausea and vomiting, vaginal bleeding, severe abdominal or pelvic pain, elevated blood pressure or other problems related to your pregnancy or postpartum time, please come to the Maternity Assessment Unit for assistance.       Third Trimester of Pregnancy The third trimester of pregnancy is from week 28 through week 35. This is months 7 through 9. The third trimester is a time when the unborn baby (fetus) is growing rapidly. At the end of the ninth month, the fetus is about 20 inches long and weighs 6-10 pounds. Body changes during your third trimester During the third trimester, your body will continue to go through many changes. The changes vary and generally return to normal after your baby is born. Physical changes Your weight will continue to increase. You can expect to gain 25-35 pounds (11-16 kg) by the end of the pregnancy if you begin pregnancy at a normal weight. If you are underweight, you can expect to gain 28-40 lb (about 13-18 kg), and if you are overweight, you can expect to gain 15-25 lb (about 7-11 kg). You may begin to get stretch marks on your hips, abdomen, and breasts. Your breasts will continue to grow and may hurt. A yellow fluid (colostrum) may leak from your breasts. This is the first milk you are producing for your baby. You may have changes in your hair. These can include thickening of your hair, rapid growth, and changes in texture. Some people also  have hair loss during or after pregnancy, or hair that feels dry or thin. Your belly button may stick out. You may notice more swelling in your hands, face, or ankles. Health changes You may have heartburn. You may have constipation. You may develop hemorrhoids. You may develop swollen, bulging veins (varicose veins) in your legs. You may have increased body aches in the pelvis, back, or thighs. This is due to weight gain and increased hormones that are relaxing your joints. You may have increased tingling or numbness in your hands, arms, and legs. The skin on your abdomen may also feel numb. You may feel short of breath because of your expanding uterus. Other changes You may urinate more often because the fetus is moving lower into your pelvis and pressing on your bladder. You may have more problems sleeping. This may be caused by the size of your abdomen, an increased need to urinate, and an increase in your body's metabolism. You may notice the fetus "dropping," or moving lower in your abdomen (lightening). You may have increased vaginal discharge. You may notice that you have pain around your pelvic bone as your uterus distends. Follow these instructions at home: Medicines Follow your health care provider's instructions regarding medicine use. Specific medicines may be either safe or unsafe to take during pregnancy. Do not take any medicines unless approved by your health care provider. Take a prenatal vitamin that contains at least 600 micrograms (mcg) of folic acid. Eating and drinking Eat a healthy diet that includes fresh fruits  and vegetables, whole grains, good sources of protein such as meat, eggs, or tofu, and low-fat dairy products. Avoid raw meat and unpasteurized juice, milk, and cheese. These carry germs that can harm you and your baby. Eat 4 or 5 small meals rather than 3 large meals a day. You may need to take these actions to prevent or treat constipation: Drink enough  fluid to keep your urine pale yellow. Eat foods that are high in fiber, such as beans, whole grains, and fresh fruits and vegetables. Limit foods that are high in fat and processed sugars, such as fried or sweet foods. Activity Exercise only as directed by your health care provider. Most people can continue their usual exercise routine during pregnancy. Try to exercise for 30 minutes at least 5 days a week. Stop exercising if you experience contractions in the uterus. Stop exercising if you develop pain or cramping in the lower abdomen or lower back. Avoid heavy lifting. Do not exercise if it is very hot or humid or if you are at a high altitude. If you choose to, you may continue to have sex unless your health care provider tells you not to. Relieving pain and discomfort Take frequent breaks and rest with your legs raised (elevated) if you have leg cramps or low back pain. Take warm sitz baths to soothe any pain or discomfort caused by hemorrhoids. Use hemorrhoid cream if your health care provider approves. Wear a supportive bra to prevent discomfort from breast tenderness. If you develop varicose veins: Wear support hose as told by your health care provider. Elevate your feet for 15 minutes, 3-4 times a day. Limit salt in your diet. Safety Talk to your health care provider before traveling far distances. Do not use hot tubs, steam rooms, or saunas. Wear your seat belt at all times when driving or riding in a car. Talk with your health care provider if someone is verbally or physically abusive to you. Preparing for birth To prepare for the arrival of your baby: Take prenatal classes to understand, practice, and ask questions about labor and delivery. Visit the hospital and tour the maternity area. Purchase a rear-facing car seat and make sure you know how to install it in your car. Prepare the baby's room or sleeping area. Make sure to remove all pillows and stuffed animals from the  baby's crib to prevent suffocation. General instructions Avoid cat litter boxes and soil used by cats. These carry germs that can cause birth defects in the baby. If you have a cat, ask someone to clean the litter box for you. Do not douche or use tampons. Do not use scented sanitary pads. Do not use any products that contain nicotine or tobacco, such as cigarettes, e-cigarettes, and chewing tobacco. If you need help quitting, ask your health care provider. Do not use any herbal remedies, illegal drugs, or medicines that were not prescribed to you. Chemicals in these products can harm your baby. Do not drink alcohol. You will have more frequent prenatal exams during the third trimester. During a routine prenatal visit, your health care provider will do a physical exam, perform tests, and discuss your overall health. Keep all follow-up visits. This is important. Where to find more information American Pregnancy Association: americanpregnancy.[place] and Gynecologists: PoolDevices.com.pt Office on [employer] Health: KeywordPortfolios.com.br Contact a health care provider if you have: A fever. Mild pelvic cramps, pelvic pressure, or nagging pain in your abdominal area or lower back. Vomiting or diarrhea. Bad-smelling  vaginal discharge or foul-smelling urine. Pain when you urinate. A headache that does not go away when you take medicine. Visual changes or see spots in front of your eyes. Get help right away if: Your water breaks. You have regular contractions less than 5 minutes apart. You have spotting or bleeding from your vagina. You have severe abdominal pain. You have difficulty breathing. You have chest pain. You have fainting spells. You have not felt your baby move for the time period told by your health care provider. You have new or increased pain, swelling, or redness in an arm or leg. Summary The third trimester of pregnancy is  from week 28 through week 40 (months 7 through 9). You may have more problems sleeping. This can be caused by the size of your abdomen, an increased need to urinate, and an increase in your body's metabolism. You will have more frequent prenatal exams during the third trimester. Keep all follow-up visits. This is important. This information is not intended to replace advice given to you by your health care provider. Make sure you discuss any questions you have with your health care provider. Document Revised: 11/10/2019 Document Reviewed: 09/16/2019 Elsevier Patient Education  2022 Elsevier Inc.       Preterm Labor The normal length of a pregnancy is 39-41 weeks. Preterm labor is when labor starts before 37 completed weeks of pregnancy. Babies who are born prematurely and survive may not be fully developed and may be at an increased risk for long-term problems such as cerebral palsy, developmental delays, and vision and hearing problems. Babies who are born too early may have problems soon after birth. Premature babies may have problems regulating blood sugar, body temperature, heart rate, and breathing rate. These babies often have trouble with feeding. The risk of having problems is highest for babies who are born before 34 weeks of pregnancy. What are the causes? The exact cause of this condition is not known. What increases the risk? You are more likely to have preterm labor if you have certain risk factors that relate to your medical history, problems with present and past pregnancies, and lifestyle factors. Medical history You have abnormalities of the uterus, including a short cervix. You have STIs (sexually transmitted infections) or other infections of the urinary tract and the vagina. You have chronic illnesses, such as blood clotting problems, diabetes, or high blood pressure. You are overweight or underweight. Present and past pregnancies You have had preterm labor before. You  are pregnant with twins or other multiples. You have been diagnosed with a condition in which the placenta covers your cervix (placenta previa). You waited less than 18 months between giving birth and becoming pregnant again. Your unborn baby has some abnormalities. You have vaginal bleeding during pregnancy. You became pregnant through in vitro fertilization (IVF). Lifestyle and environmental factors You use tobacco products or drink alcohol. You use drugs. You have stress and no social support. You experience domestic violence. You are exposed to certain chemicals or environmental pollutants. Other factors You are younger than age 68 or older than age 67. What are the signs or symptoms? Symptoms of this condition include: Cramps similar to those that can happen during a menstrual period. The cramps may happen with diarrhea. Pain in the abdomen or lower back. Regular contractions that may feel like tightening of the abdomen. A feeling of increased pressure in the pelvis. Increased watery or bloody mucus discharge from the vagina. Water breaking (ruptured amniotic sac). How is this  diagnosed? This condition is diagnosed based on: Your medical history and a physical exam. A pelvic exam. An ultrasound. Monitoring your uterus for contractions. Other tests, including: A swab of the cervix to check for a chemical called fetal fibronectin. Urine tests. How is this treated? Treatment for this condition depends on the length of your pregnancy, your condition, and the health of your baby. Treatment may include: Taking medicines, such as: Hormone medicines. These may be given early in pregnancy to help support the pregnancy. Medicines to stop contractions. Medicines to help mature the baby's lungs. These may be prescribed if the risk of delivery is high. Medicines to help protect your baby from brain and nerve complications such as cerebral palsy. Bed rest. If the labor happens before 34  weeks of pregnancy, you may need to stay in the hospital. Delivery of the baby. Follow these instructions at home:  Do not use any products that contain nicotine or tobacco. These products include cigarettes, chewing tobacco, and vaping devices, such as e-cigarettes. If you need help quitting, ask your health care provider. Do not drink alcohol. Take over-the-counter and prescription medicines only as told by your health care provider. Rest as told by your health care provider. Return to your normal activities as told by your health care provider. Ask your health care provider what activities are safe for you. Keep all follow-up visits. This is important. How is this prevented? To increase your chance of having a full-term pregnancy: Do not use drugs or take medicines that have not been prescribed to you during your pregnancy. Talk with your health care provider before taking any herbal supplements, even if you have been taking them regularly. Make sure you gain a healthy amount of weight during your pregnancy. Watch for infection. If you think that you might have an infection, get it checked right away. Symptoms of infection may include: Fever. Abnormal vaginal discharge or discharge that smells bad. Pain or burning with urination. Needing to urinate urgently. Frequently urinating or passing small amounts of urine frequently. Blood in your urine or urine that smells bad or unusual. Where to find more information U.S. Department of Health and Cytogeneticist on Women's Health: http://hoffman.com/ [employer] of Obstetricians and Gynecologists: www.acog.org Centers for Disease Control and Prevention, Preterm Birth: FootballExhibition.com.br Contact a health care provider if: You think you are going into preterm labor. You have signs or symptoms of preterm labor. You have symptoms of infection. Get help right away if: You are having regular, painful contractions every 5 minutes or  less. Your water breaks. Summary Preterm labor is labor that starts before you reach 37 weeks of pregnancy. Delivering your baby early increases your baby's risk of developing long-term problems. You are more likely to have preterm labor if you have certain risk factors that relate to your medical history, problems with present and past pregnancies, and lifestyle factors. Keep all follow-up visits. This is important. Contact a health care provider if you have signs or symptoms of preterm labor. This information is not intended to replace advice given to you by your health care provider. Make sure you discuss any questions you have with your health care provider. Document Revised: 06/06/2020 Document Reviewed: 06/06/2020 Elsevier Patient Education  2022 [employer].

## 2021-03-01 LAB — GLUCOSE TOLERANCE, 2 HOURS W/ 1HR
Glucose, 1 hour: 149 mg/dL (ref 65–179)
Glucose, 2 hour: 109 mg/dL (ref 65–152)
Glucose, Fasting: 93 mg/dL — ABNORMAL HIGH (ref 65–91)

## 2021-03-01 LAB — CBC
Hematocrit: 30.9 % — ABNORMAL LOW (ref 34.0–46.6)
Hemoglobin: 10.1 g/dL — ABNORMAL LOW (ref 11.1–15.9)
MCH: 25.2 pg — ABNORMAL LOW (ref 26.6–33.0)
MCHC: 32.7 g/dL (ref 31.5–35.7)
MCV: 77 fL — ABNORMAL LOW (ref 79–97)
Platelets: 225 10*3/uL (ref 150–450)
RBC: 4.01 x10E6/uL (ref 3.77–5.28)
RDW: 16.4 % — ABNORMAL HIGH (ref 11.7–15.4)
WBC: 5.7 10*3/uL (ref 3.4–10.8)

## 2021-03-01 LAB — HIV ANTIBODY (ROUTINE TESTING W REFLEX): HIV Screen 4th Generation wRfx: NONREACTIVE

## 2021-03-01 LAB — RPR: RPR Ser Ql: NONREACTIVE

## 2021-03-01 IMAGING — US US MFM OB FOLLOW-UP
1 series · 14 of 28 positions shown · non-contrast
Comparison: none

[Series 1: us mfm ob follow-up · 29 acquisitions, 14 frames shown]
[im 2/29]
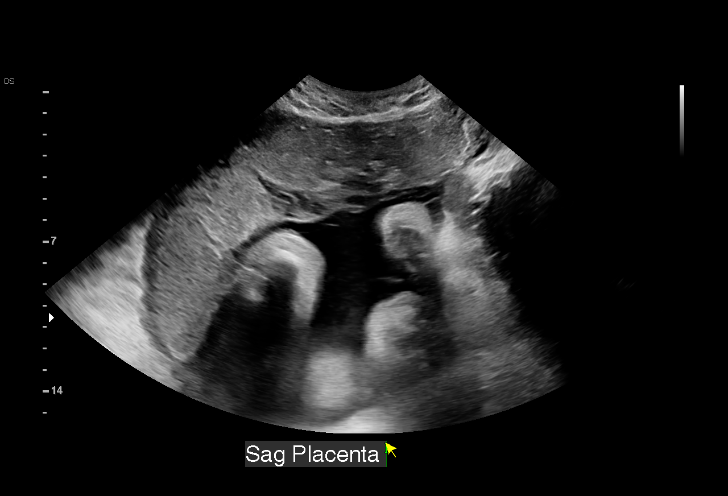
[im 4/29]
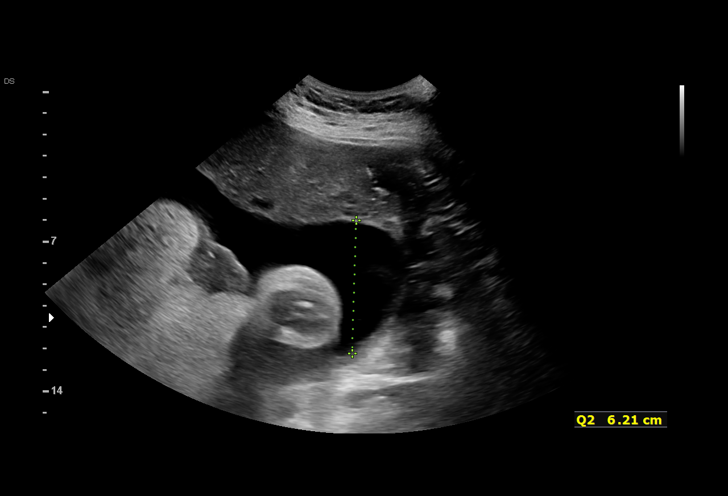
[im 6/29]
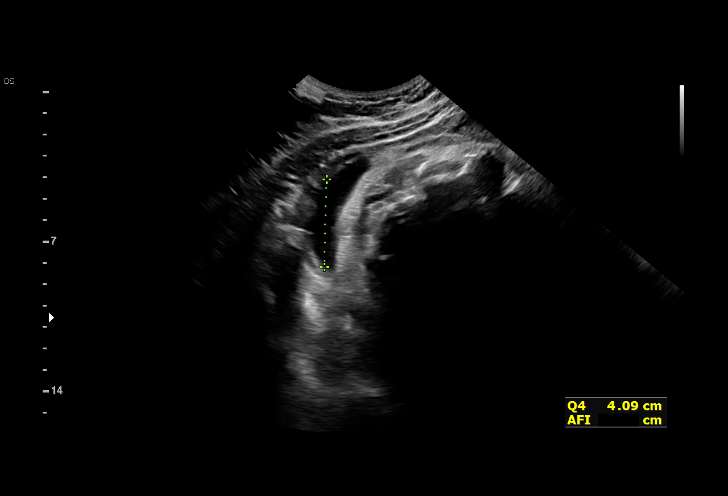
[im 8/29]
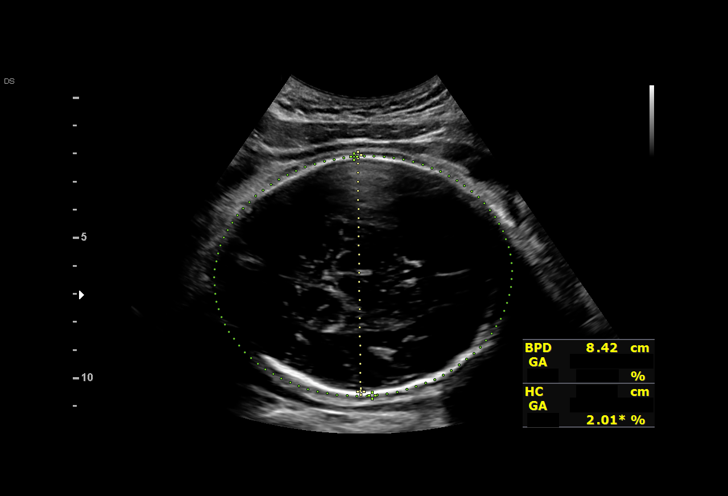
[im 10/29]
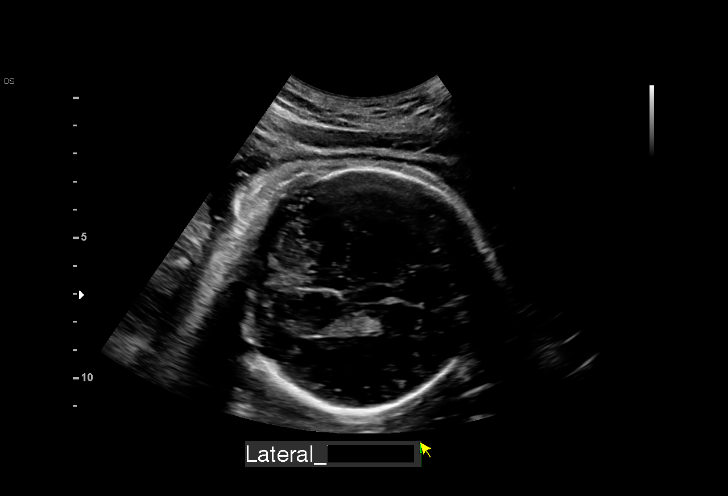
[im 12/29]
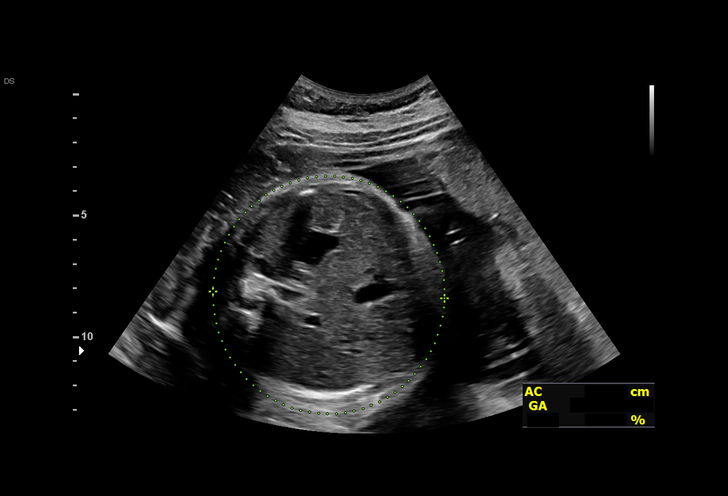
[im 14/29]
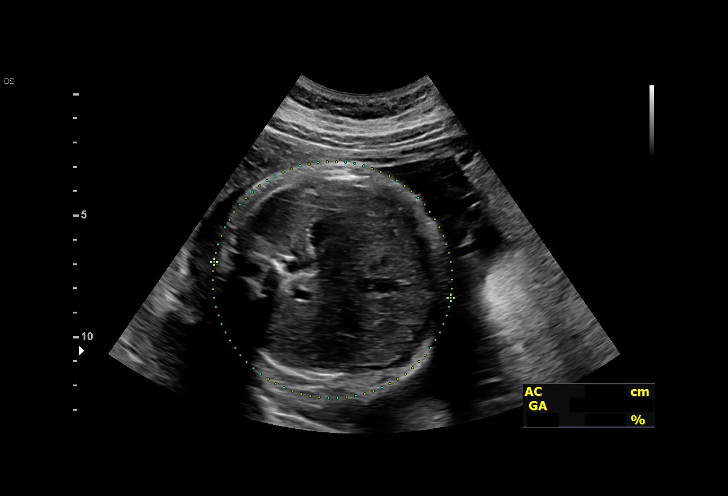
[im 16/29]
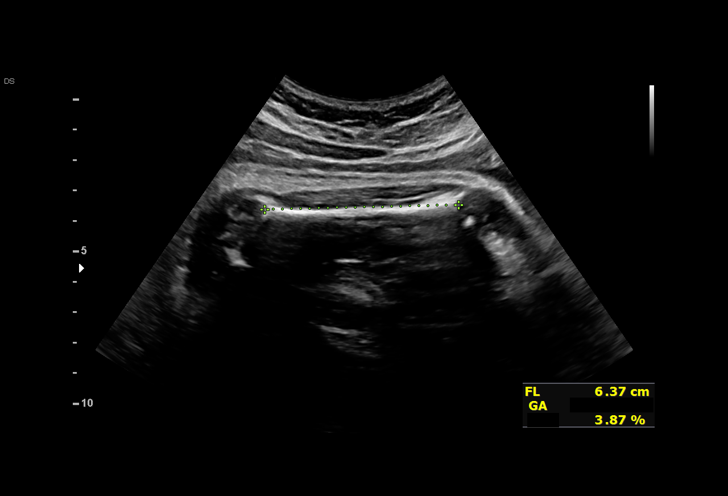
[im 18/29]
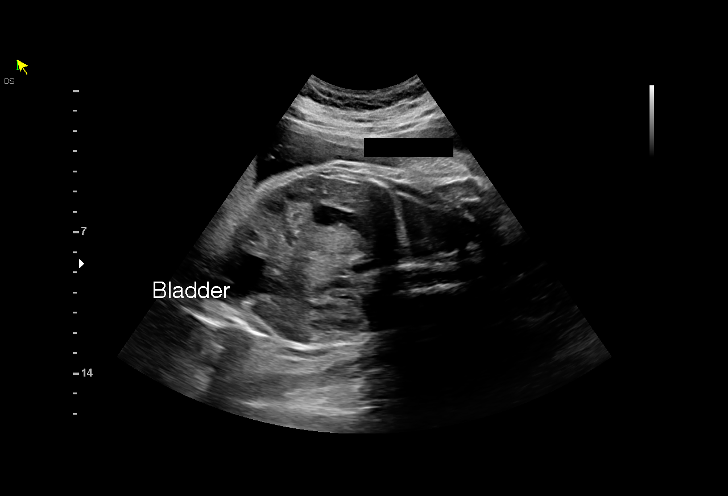
[im 20/29]
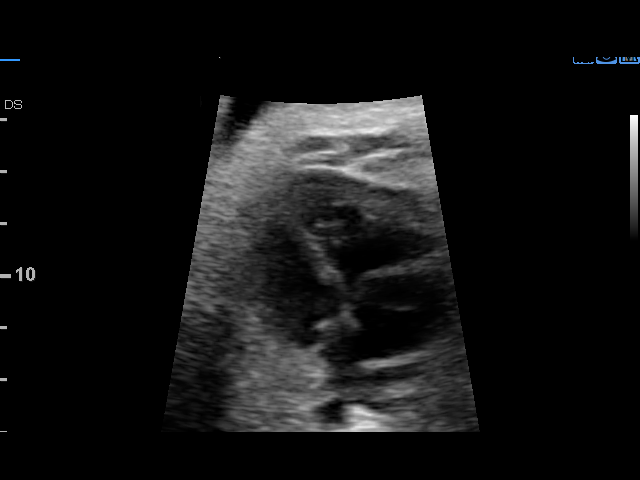
[im 22/29]
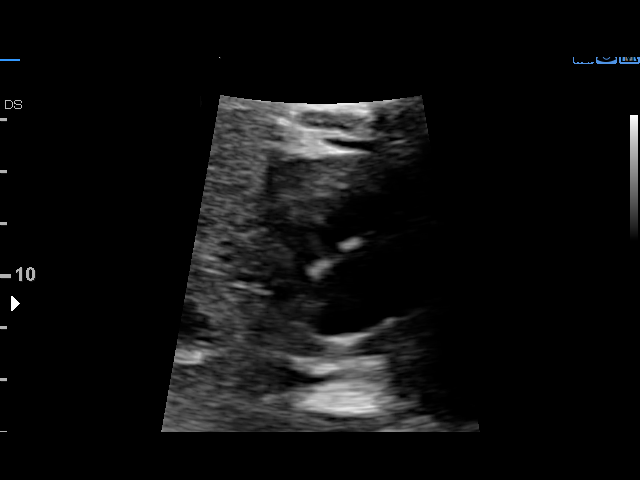
[im 24/29]
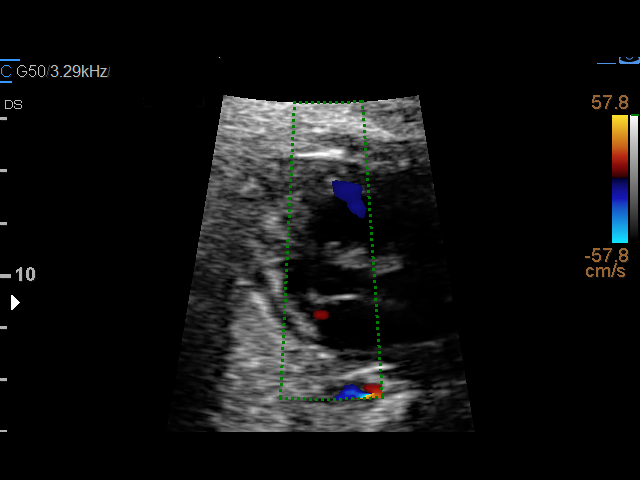
[im 26/29]
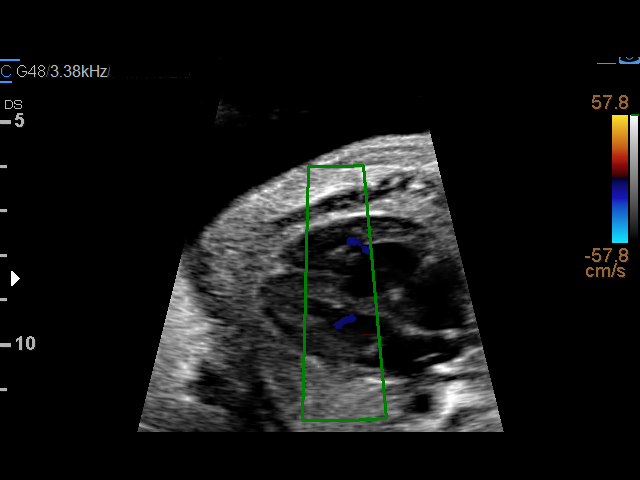
[im 29/29]
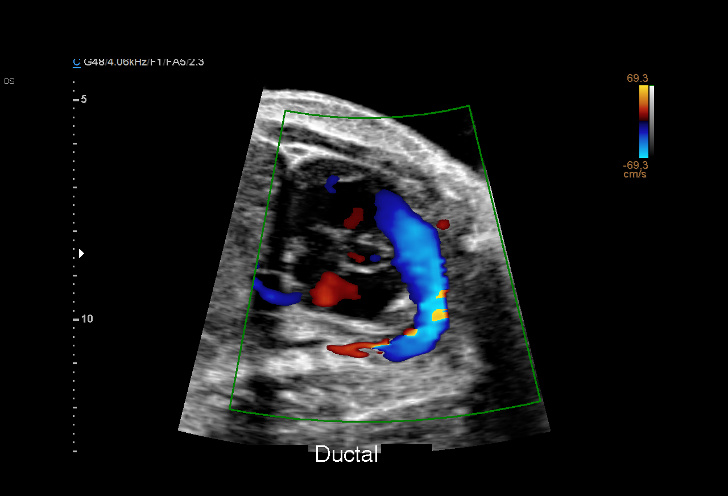

[14 of 28 positions shown; findings below may reference images not displayed]

Ref. Address:     [facility]

Indications

 35 weeks gestation of pregnancy
 Encounter for other antenatal screening
 follow-up (Pre-G BMI 32)
 Obesity complicating pregnancy, second
 trimester
 Negative Horizon(Low Risk NIPS),  AFP -
 Negative
Vital Signs

                                                Height:        5'3"
Fetal Evaluation

 Num Of Fetuses:          1
 Cardiac Activity:        Observed
 Presentation:            Cephalic
 Placenta:                Anterior
 P. Cord Insertion:       Previously Visualized

 Amniotic Fluid
 AFI FV:      Within normal limits

 AFI Sum(cm)     %Tile       Largest Pocket(cm)
 19.58           73
 RUQ(cm)       RLQ(cm)       LUQ(cm)        LLQ(cm)

Biometry

 BPD:      84.2  mm     G. Age:  33w 6d         19  %    CI:        78.78   %    70 - 86
                                                         FL/HC:       21.4  %    20.1 -
 HC:        300  mm     G. Age:  33w 2d        1.1  %    HC/AC:       0.98       0.93 -
 AC:        307  mm     G. Age:  34w 5d         42  %    FL/BPD:      76.2  %    71 - 87
 FL:       64.2  mm     G. Age:  33w 1d          6  %    FL/AC:       20.9  %    20 - 24

 Est. FW:    0100   gm     5 lb 2 oz     19  %
OB History

 Gravidity:    1
Gestational Age

 LMP:           38w 4d        Date:  06/11/19                 EDD:   03/17/20
 U/S Today:     33w 5d                                        EDD:   04/20/20
 Best:          35w 1d     Det. By:  Early Ultrasound         EDD:   04/10/20
                                     (09/19/19)
Anatomy

 Cranium:               Appears normal         LVOT:                   Appears normal
 Cavum:                 Previously seen        Aortic Arch:            Previously seen
 Ventricles:            Appears normal         Ductal Arch:            Appears normal
 Choroid Plexus:        Previously seen        Diaphragm:              Appears normal
 Cerebellum:            Previously seen        Stomach:                Appears normal, left
                                                                       sided
 Posterior Fossa:       Previously seen        Abdomen:                Previously seen
 Nuchal Fold:           Previously seen        Abdominal Wall:         Previously seen
 Face:                  Orbits and profile     Cord Vessels:           Previously seen
                        previously seen
 Lips:                  Previously seen        Kidneys:                Appear normal
 Palate:                Previously seen        Bladder:                Appears normal
 Thoracic:              Appears normal         Spine:                  Previously seen
 Heart:                 Appears normal         Upper Extremities:      Previously seen
                        (4CH, axis, and
                        situs)
 RVOT:                  Appears normal         Lower Extremities:      Previously seen

 Other:  Male gender. Heels visualized previously. Technically difficult due to
         maternal habitus and fetal position.
Cervix Uterus Adnexa

 Cervix
 Not visualized (advanced GA >21wks)
Impression

 Follow up growth due to elevated BMI of 32
 Normal interval growth with measurements consistent with
 dates
 Good fetal movement and amniotic fluid volume
Recommendations
 Follow up as clinically indicated.

## 2021-03-02 ENCOUNTER — Encounter (HOSPITAL_COMMUNITY)
Admission: RE | Admit: 2021-03-02 | Discharge: 2021-03-02 | Disposition: A | Discharge status: Home / Self Care | Payer: Medicaid Other | Source: Ambulatory Visit | Attending: Family Medicine | Admitting: Family Medicine

## 2021-03-02 DIAGNOSIS — Z8759 Personal history of other complications of pregnancy, childbirth and the puerperium: Secondary | ICD-10-CM | POA: Insufficient documentation

## 2021-03-02 DIAGNOSIS — O24419 Gestational diabetes mellitus in pregnancy, unspecified control: Secondary | ICD-10-CM | POA: Insufficient documentation

## 2021-03-02 DIAGNOSIS — Z3A00 Weeks of gestation of pregnancy not specified: Secondary | ICD-10-CM | POA: Diagnosis not present

## 2021-03-02 DIAGNOSIS — Z3481 Encounter for supervision of other normal pregnancy, first trimester: Secondary | ICD-10-CM | POA: Insufficient documentation

## 2021-03-02 MED ORDER — SODIUM CHLORIDE 0.9 % IV SOLN
300.0000 mg | INTRAVENOUS | Status: DC
  Administered 2021-03-02: 300 mg via INTRAVENOUS
  Filled 2021-03-02: qty 15

## 2021-03-05 ENCOUNTER — Other Ambulatory Visit: Payer: Self-pay

## 2021-03-05 ENCOUNTER — Encounter: Payer: Self-pay | Admitting: Women's Health

## 2021-03-05 DIAGNOSIS — O24419 Gestational diabetes mellitus in pregnancy, unspecified control: Secondary | ICD-10-CM

## 2021-03-05 MED ORDER — ACCU-CHEK GUIDE VI STRP: ORAL_STRIP | 12 refills | Status: DC

## 2021-03-05 MED ORDER — ACCU-CHEK SOFTCLIX LANCETS MISC: 12 refills | Status: DC

## 2021-03-05 MED ORDER — ACCU-CHEK GUIDE W/DEVICE KIT: 1.0000 | PACK | Freq: Four times a day (QID) | 0 refills | Status: DC

## 2021-03-05 NOTE — Progress Notes (Signed)
Please call patient to alert to new GDM diagnosis, order supplies per protocol and schedule with diabetes education.  Thank you, [name]

## 2021-03-08 ENCOUNTER — Telehealth: Payer: Self-pay

## 2021-03-08 NOTE — Telephone Encounter (Signed)
Pt called this morning concerned about one her glucola readings. Stated her fasting was 101 and was worried about the reading. Advised patient not to stress over the reading at this point because technically since she had not been to her DM class she had not been properly educated on what were the proper numbers and diet. She had just been given a guideline and since I am not a certified diabetic educator I can only give suggestions. Patient did however inform me today that she was NOT fasting the day of her glucola appointment. Stated the morning of her appointment she was not feeling well and that she had a sore throat and had eaten a cough drop prior to her appointment. She wanted to know if that could have affected her fasting glucose which was elevated and I advised patient yes. She wanted to know if she could repeat her test since most of her numbers at home are within range. Advised patient to discuss this with provider at next appointment. Encouraged patient to continue to check glucolas and maintain diet until appointment. Pt agreed with plan and verbalized understanding. Will discuss with provider at appoinntment on 03/14/21.

## 2021-03-09 ENCOUNTER — Other Ambulatory Visit: Payer: Self-pay

## 2021-03-09 ENCOUNTER — Encounter (HOSPITAL_COMMUNITY)
Admission: RE | Admit: 2021-03-09 | Discharge: 2021-03-09 | Disposition: A | Discharge status: Home / Self Care | Payer: Medicaid Other | Source: Ambulatory Visit | Attending: Family Medicine | Admitting: Family Medicine

## 2021-03-09 DIAGNOSIS — Z3481 Encounter for supervision of other normal pregnancy, first trimester: Secondary | ICD-10-CM | POA: Diagnosis not present

## 2021-03-09 MED ORDER — BLOOD PRESSURE KIT DEVI: 1.0000 | 0 refills | Status: AC | PRN

## 2021-03-09 MED ORDER — SODIUM CHLORIDE 0.9 % IV SOLN
300.0000 mg | INTRAVENOUS | Status: DC
  Administered 2021-03-09: 300 mg via INTRAVENOUS
  Filled 2021-03-09: qty 15

## 2021-03-14 ENCOUNTER — Other Ambulatory Visit: Payer: Self-pay

## 2021-03-14 ENCOUNTER — Encounter: Payer: Self-pay | Admitting: Obstetrics and Gynecology

## 2021-03-14 ENCOUNTER — Ambulatory Visit (INDEPENDENT_AMBULATORY_CARE_PROVIDER_SITE_OTHER): Payer: Medicaid Other | Admitting: Obstetrics and Gynecology

## 2021-03-14 VITALS — BP 119/68 | HR 93 | Wt 216.0 lb

## 2021-03-14 DIAGNOSIS — Z3481 Encounter for supervision of other normal pregnancy, first trimester: Secondary | ICD-10-CM

## 2021-03-14 DIAGNOSIS — O24419 Gestational diabetes mellitus in pregnancy, unspecified control: Secondary | ICD-10-CM

## 2021-03-14 DIAGNOSIS — Z8759 Personal history of other complications of pregnancy, childbirth and the puerperium: Secondary | ICD-10-CM

## 2021-03-14 NOTE — Progress Notes (Signed)
ROB wants to recheck her 2 hr. GTT because she had a cough drop just before testing.

## 2021-03-14 NOTE — Patient Instructions (Signed)

## 2021-03-14 NOTE — Progress Notes (Signed)
Subjective:  Nicole Schaefer is a 20 y.o. G2P1001 at [redacted]w[redacted]d being seen today for ongoing prenatal care.  She is currently monitored for the following issues for this low-risk pregnancy and has Prediabetes; Arteriovenous malformation; Exercise induced bronchospasm; Perennial allergic rhinitis; History of gestational hypertension; Encounter for supervision of normal pregnancy in first trimester; and Gestational diabetes on their problem list.  Patient reports no complaints.  Contractions: Not present. Vag. Bleeding: None.  Movement: Present. Denies leaking of fluid.   The following portions of the patient's history were reviewed and updated as appropriate: allergies, current medications, past family history, past medical history, past social history, past surgical history and problem list. Problem list updated.  Objective:   Vitals:   03/14/21 1456  BP: 119/68  Pulse: 93  Weight: 216 lb (98 kg)    Fetal Status: Fetal Heart Rate (bpm): 141   Movement: Present     General:  Alert, oriented and cooperative. Patient is in no acute distress.  Skin: Skin is warm and dry. No rash noted.   Cardiovascular: Normal heart rate noted  Respiratory: Normal respiratory effort, no problems with respiration noted  Abdomen: Soft, gravid, appropriate for gestational age. Pain/Pressure: Present     Pelvic:  Cervical exam deferred        Extremities: Normal range of motion.  Edema: None  Mental Status: Normal mood and affect. Normal behavior. Normal judgment and thought content.   Urinalysis:      Assessment and Plan:  Pregnancy: G2P1001 at [redacted]w[redacted]d  1. Encounter for supervision of other normal pregnancy in first trimester Stable Growth scan next month  2. Gestational diabetes mellitus (GDM), antepartum, gestational diabetes method of control unspecified Desires repeat as pt had cough drop prior to last test. Will schedule ASAp  3. History of gestational hypertension BP stable. No S/Sx of GHTN or  PEC  Preterm labor symptoms and general obstetric precautions including but not limited to vaginal bleeding, contractions, leaking of fluid and fetal movement were reviewed in detail with the patient. Please refer to After Visit Summary for other counseling recommendations.  Return in about 2 weeks (around 03/28/2021) for OB visit, face to face, any provider.   Hermina Staggers, MD

## 2021-03-16 ENCOUNTER — Encounter (HOSPITAL_COMMUNITY): Payer: Medicaid Other

## 2021-03-19 ENCOUNTER — Other Ambulatory Visit: Payer: Self-pay

## 2021-03-19 ENCOUNTER — Other Ambulatory Visit: Payer: Medicaid Other

## 2021-03-19 DIAGNOSIS — Z3481 Encounter for supervision of other normal pregnancy, first trimester: Secondary | ICD-10-CM | POA: Diagnosis not present

## 2021-03-20 LAB — GLUCOSE TOLERANCE, 2 HOURS W/ 1HR
Glucose, 1 hour: 151 mg/dL (ref 65–179)
Glucose, 2 hour: 95 mg/dL (ref 65–152)
Glucose, Fasting: 86 mg/dL (ref 65–91)

## 2021-03-21 ENCOUNTER — Encounter: Payer: Medicaid Other | Attending: Women's Health | Admitting: Registered"

## 2021-03-24 ENCOUNTER — Inpatient Hospital Stay (HOSPITAL_COMMUNITY)
Admission: AD | Admit: 2021-03-24 | Discharge: 2021-03-24 | Disposition: A | Discharge status: Home / Self Care | Payer: Medicaid Other | Attending: Obstetrics and Gynecology | Admitting: Obstetrics and Gynecology

## 2021-03-24 ENCOUNTER — Other Ambulatory Visit: Payer: Self-pay

## 2021-03-24 ENCOUNTER — Encounter (HOSPITAL_COMMUNITY): Payer: Self-pay | Admitting: Obstetrics and Gynecology

## 2021-03-24 DIAGNOSIS — Z3A30 30 weeks gestation of pregnancy: Secondary | ICD-10-CM | POA: Insufficient documentation

## 2021-03-24 DIAGNOSIS — O4703 False labor before 37 completed weeks of gestation, third trimester: Secondary | ICD-10-CM | POA: Diagnosis not present

## 2021-03-24 DIAGNOSIS — B3731 Acute candidiasis of vulva and vagina: Secondary | ICD-10-CM | POA: Diagnosis not present

## 2021-03-24 DIAGNOSIS — R102 Pelvic and perineal pain: Secondary | ICD-10-CM | POA: Diagnosis present

## 2021-03-24 DIAGNOSIS — O98813 Other maternal infectious and parasitic diseases complicating pregnancy, third trimester: Secondary | ICD-10-CM | POA: Insufficient documentation

## 2021-03-24 DIAGNOSIS — O26893 Other specified pregnancy related conditions, third trimester: Secondary | ICD-10-CM | POA: Diagnosis not present

## 2021-03-24 DIAGNOSIS — O24419 Gestational diabetes mellitus in pregnancy, unspecified control: Secondary | ICD-10-CM

## 2021-03-24 DIAGNOSIS — Z3481 Encounter for supervision of other normal pregnancy, first trimester: Secondary | ICD-10-CM

## 2021-03-24 LAB — URINALYSIS, ROUTINE W REFLEX MICROSCOPIC
Bilirubin Urine: NEGATIVE
Glucose, UA: NEGATIVE mg/dL
Hgb urine dipstick: NEGATIVE
Ketones, ur: NEGATIVE mg/dL
Nitrite: NEGATIVE
Protein, ur: NEGATIVE mg/dL
Specific Gravity, Urine: 1.005 (ref 1.005–1.030)
pH: 6 (ref 5.0–8.0)

## 2021-03-24 LAB — WET PREP, GENITAL
Clue Cells Wet Prep HPF POC: NONE SEEN
Sperm: NONE SEEN
Trich, Wet Prep: NONE SEEN
Yeast Wet Prep HPF POC: NONE SEEN

## 2021-03-24 MED ORDER — TERCONAZOLE 0.4 % VA CREA: 1.0000 | TOPICAL_CREAM | Freq: Every day | VAGINAL | 0 refills | Status: DC

## 2021-03-24 MED ORDER — NIFEDIPINE 10 MG PO CAPS
10.0000 mg | ORAL_CAPSULE | ORAL | Status: AC
Start: 2021-03-24 — End: 2021-03-24
  Administered 2021-03-24 (×2): 10 mg via ORAL
  Filled 2021-03-24 (×2): qty 1

## 2021-03-24 NOTE — MAU Provider Note (Signed)
History     CSN: [id number]  Arrival date and time: 03/24/21 1517   Event Date/Time   First Provider Initiated Contact with Patient 03/24/21 1703      Chief Complaint  Patient presents with   Pelvic Pain   Nicole Schaefer is a 20 y.o. year old G22P1001 female at [redacted]w[redacted]d weeks gestation who presents to MAU reporting pelvic pain for 2 days. She describes the pain as intermittent, lasting 5 seconds at a time. She denies VB, LOF or abnormal vaginal discharge. She reports good (+) FM. She receives [facility].   OB History     Gravida  2   Para  1   Term  1   Preterm      AB      Living  1      SAB      IAB      Ectopic      Multiple  0   Live Births  1           Past Medical History:  Diagnosis Date   Asthma    has been a while since inhaler/neb use    Varicose vein of leg     Past Surgical History:  Procedure Laterality Date   LEG SURGERY Left     Family History  Problem Relation Age of Onset   Healthy Mother    Healthy Father    Hypertension Father    Hyperlipidemia Father    Sleep apnea Father    Diabetes Paternal Grandmother    Heart attack Paternal Grandmother     Social History   Tobacco Use   Smoking status: Never   Smokeless tobacco: Never  Vaping Use   Vaping Use: Never used  Substance Use Topics   Alcohol use: No   Drug use: No    Allergies:  Allergies  Allergen Reactions   Pollen Extract     Runny nose    No medications prior to admission.    Review of Systems  Constitutional: Negative.   HENT: Negative.    Eyes: Negative.   Endocrine: Negative.   Genitourinary:  Positive for vaginal pain.  Allergic/Immunologic: Negative.   Neurological: Negative.   Hematological: Negative.   Psychiatric/Behavioral: Negative.    Physical Exam   Blood pressure 120/62, pulse 100, temperature 98 F (36.7 C), temperature source Oral, resp. rate 20, height 5\' 3"  (1.6 m), weight 99.2 kg, last menstrual period 06/23/2020,  SpO2 100 %, not currently breastfeeding.  Physical Exam Constitutional:      Appearance: Normal appearance. She is obese.  Cardiovascular:     Rate and Rhythm: Normal rate.  Abdominal:     Palpations: Abdomen is soft.  Genitourinary:    General: Normal vulva.     Comments: Pelvic exam: External genitalia normal, SE: vaginal walls pink and well rugated, cervix is smooth, pink, no lesions, small amt of thick, curdy, yellowish-white vaginal d/c -- WP, GC/CT done, cervix: closed/long/high, Uterus is non-tender, S=D, no CMT or friability, no adnexal tenderness. Musculoskeletal:        General: Normal range of motion.  Skin:    General: Skin is warm and dry.  Neurological:     Mental Status: She is alert and oriented to person, place, and time.  Psychiatric:        Mood and Affect: Mood normal.        Behavior: Behavior normal.        Thought Content: Thought content normal.  Judgment: Judgment normal.   REACTIVE NST - FHR: 145 bpm / moderate variability / accels present / decels absent / TOCO: UI noted  UI spaced to irregular  MAU Course  Procedures  MDM Wet Prep GC/CT -- Results pending  Procardia 10 mg every 20 mins x 3 doses -- relieved pelvic pain and contractions stopped  Results for orders placed or performed during the hospital encounter of 03/24/21 (from the past 24 hour(s))  Urinalysis, Routine w reflex microscopic Urine, Clean Catch     Status: Abnormal   Collection Time: 03/24/21  3:59 PM  Result Value Ref Range   Color, Urine STRAW (A) YELLOW   APPearance CLEAR CLEAR   Specific Gravity, Urine 1.005 1.005 - 1.030   pH 6.0 5.0 - 8.0   Glucose, UA NEGATIVE NEGATIVE mg/dL   Hgb urine dipstick NEGATIVE NEGATIVE   Bilirubin Urine NEGATIVE NEGATIVE   Ketones, ur NEGATIVE NEGATIVE mg/dL   Protein, ur NEGATIVE NEGATIVE mg/dL   Nitrite NEGATIVE NEGATIVE   Leukocytes,Ua SMALL (A) NEGATIVE   RBC / HPF 0-5 0 - 5 RBC/hpf   WBC, UA 0-5 0 - 5 WBC/hpf   Bacteria, UA  RARE (A) NONE SEEN   Squamous Epithelial / LPF 0-5 0 - 5  Wet prep, genital     Status: Abnormal   Collection Time: 03/24/21  5:03 PM   Specimen: Vaginal  Result Value Ref Range   Yeast Wet Prep HPF POC NONE SEEN NONE SEEN   Trich, Wet Prep NONE SEEN NONE SEEN   Clue Cells Wet Prep HPF POC NONE SEEN NONE SEEN   WBC, Wet Prep HPF POC MANY (A) NONE SEEN   Sperm NONE SEEN     Assessment and Plan  Vaginal pain  - Tx for yeast infection based on clinical presentation of pelvic - Rx for Terazol 0.4% cream hs x 7 days - Information provided on yeast infection   [redacted] weeks gestation of pregnancy  Preterm uterine contractions in third trimester, antepartum  - Advised to stay well-hydrated and return for greater than 6 painful contractions in 1 hour  - Discharge home - Keep scheduled appt at [facility] on 03/27/2021 - Patient verbalized an understanding of the plan of care and agrees.    Raelyn Mora, CNM 03/24/2021, 5:10 PM

## 2021-03-24 NOTE — MAU Note (Signed)
Presents with c/o pelvic pain, reports pain has existed for a couple days.  Reports pain is intermittent and last 5 seconds each time it occurs.  Denies VB or LOF.  Endorses +FM.

## 2021-03-26 LAB — GC/CHLAMYDIA PROBE AMP (~~LOC~~) NOT AT ARMC
Chlamydia: NEGATIVE
Comment: NEGATIVE
Comment: NORMAL
Neisseria Gonorrhea: NEGATIVE

## 2021-03-27 ENCOUNTER — Ambulatory Visit: Payer: Medicaid Other | Attending: Maternal & Fetal Medicine

## 2021-03-27 ENCOUNTER — Other Ambulatory Visit: Payer: Self-pay | Admitting: *Deleted

## 2021-03-27 ENCOUNTER — Ambulatory Visit: Payer: Medicaid Other | Admitting: *Deleted

## 2021-03-27 ENCOUNTER — Other Ambulatory Visit: Payer: Self-pay

## 2021-03-27 VITALS — BP 110/64 | HR 91

## 2021-03-27 DIAGNOSIS — O2693 Pregnancy related conditions, unspecified, third trimester: Secondary | ICD-10-CM | POA: Insufficient documentation

## 2021-03-27 DIAGNOSIS — O09293 Supervision of pregnancy with other poor reproductive or obstetric history, third trimester: Secondary | ICD-10-CM | POA: Diagnosis not present

## 2021-03-27 DIAGNOSIS — Z362 Encounter for other antenatal screening follow-up: Secondary | ICD-10-CM | POA: Insufficient documentation

## 2021-03-27 DIAGNOSIS — Z3481 Encounter for supervision of other normal pregnancy, first trimester: Secondary | ICD-10-CM

## 2021-03-27 DIAGNOSIS — O24419 Gestational diabetes mellitus in pregnancy, unspecified control: Secondary | ICD-10-CM | POA: Diagnosis present

## 2021-03-27 DIAGNOSIS — Z6837 Body mass index (BMI) 37.0-37.9, adult: Secondary | ICD-10-CM

## 2021-03-27 DIAGNOSIS — Z3A31 31 weeks gestation of pregnancy: Secondary | ICD-10-CM | POA: Diagnosis not present

## 2021-03-27 DIAGNOSIS — O24410 Gestational diabetes mellitus in pregnancy, diet controlled: Secondary | ICD-10-CM

## 2021-03-27 DIAGNOSIS — Z3483 Encounter for supervision of other normal pregnancy, third trimester: Secondary | ICD-10-CM | POA: Diagnosis not present

## 2021-03-27 DIAGNOSIS — O99213 Obesity complicating pregnancy, third trimester: Secondary | ICD-10-CM | POA: Insufficient documentation

## 2021-03-28 ENCOUNTER — Ambulatory Visit (INDEPENDENT_AMBULATORY_CARE_PROVIDER_SITE_OTHER): Payer: Medicaid Other | Admitting: Obstetrics & Gynecology

## 2021-03-28 VITALS — BP 108/68 | HR 89 | Wt 215.6 lb

## 2021-03-28 DIAGNOSIS — Z3481 Encounter for supervision of other normal pregnancy, first trimester: Secondary | ICD-10-CM

## 2021-03-28 NOTE — Progress Notes (Signed)
+   Fetal movement. No complaints. Pt has continued to check blood sugars. Pt has log to review. Would like to discuss since repeat 2 hour was normal.

## 2021-03-28 NOTE — Progress Notes (Signed)
   PRENATAL VISIT NOTE  Subjective:  Nicole Schaefer is a 20 y.o. G2P1001 at [redacted]w[redacted]d being seen today for ongoing prenatal care.  She is currently monitored for the following issues for this high-risk pregnancy and has Prediabetes; Arteriovenous malformation; Exercise induced bronchospasm; Perennial allergic rhinitis; History of gestational hypertension; Encounter for supervision of normal pregnancy in first trimester; and Gestational diabetes on their problem list.  Patient reports no complaints.  Contractions: Irritability. Vag. Bleeding: None.  Movement: Present. Denies leaking of fluid.   The following portions of the patient's history were reviewed and updated as appropriate: allergies, current medications, past family history, past medical history, past social history, past surgical history and problem list.   Objective:   Vitals:   03/28/21 0847  BP: 108/68  Pulse: 89  Weight: 215 lb 9.6 oz (97.8 kg)    Fetal Status: Fetal Heart Rate (bpm): 143   Movement: Present     General:  Alert, oriented and cooperative. Patient is in no acute distress.  Skin: Skin is warm and dry. No rash noted.   Cardiovascular: Normal heart rate noted  Respiratory: Normal respiratory effort, no problems with respiration noted  Abdomen: Soft, gravid, appropriate for gestational age.  Pain/Pressure: Absent     Pelvic: Cervical exam deferred        Extremities: Normal range of motion.  Edema: None  Mental Status: Normal mood and affect. Normal behavior. Normal judgment and thought content.   Assessment and Plan:  Pregnancy: G2P1001 at [redacted]w[redacted]d 1. Encounter for supervision of other normal pregnancy in first trimester Reviewed BG screen which was normal on repeat with FBS. No dx of GDM  Preterm labor symptoms and general obstetric precautions including but not limited to vaginal bleeding, contractions, leaking of fluid and fetal movement were reviewed in detail with the patient. Please refer to After Visit  Summary for other counseling recommendations.   Return in about 2 weeks (around 04/11/2021).  Future Appointments  Date Time Provider Department Center  04/11/2021  9:35 AM Brock Bad, MD [facility] None  04/25/2021  9:30 AM [facility] NURSE [facility]  04/25/2021  9:45 AM [facility] US4 [facility]    Scheryl Darter, MD

## 2021-03-31 ENCOUNTER — Inpatient Hospital Stay (HOSPITAL_COMMUNITY)
Admission: AD | Admit: 2021-03-31 | Discharge: 2021-03-31 | Disposition: A | Discharge status: Home / Self Care | Payer: Medicaid Other | Attending: Family Medicine | Admitting: Family Medicine

## 2021-03-31 ENCOUNTER — Encounter (HOSPITAL_COMMUNITY): Payer: Self-pay | Admitting: Family Medicine

## 2021-03-31 ENCOUNTER — Other Ambulatory Visit: Payer: Self-pay

## 2021-03-31 DIAGNOSIS — J45909 Unspecified asthma, uncomplicated: Secondary | ICD-10-CM | POA: Diagnosis not present

## 2021-03-31 DIAGNOSIS — O99513 Diseases of the respiratory system complicating pregnancy, third trimester: Secondary | ICD-10-CM | POA: Insufficient documentation

## 2021-03-31 DIAGNOSIS — O4703 False labor before 37 completed weeks of gestation, third trimester: Secondary | ICD-10-CM | POA: Diagnosis not present

## 2021-03-31 DIAGNOSIS — Z7951 Long term (current) use of inhaled steroids: Secondary | ICD-10-CM | POA: Insufficient documentation

## 2021-03-31 DIAGNOSIS — Z3A31 31 weeks gestation of pregnancy: Secondary | ICD-10-CM | POA: Insufficient documentation

## 2021-03-31 DIAGNOSIS — O26893 Other specified pregnancy related conditions, third trimester: Secondary | ICD-10-CM | POA: Diagnosis present

## 2021-03-31 DIAGNOSIS — Z7982 Long term (current) use of aspirin: Secondary | ICD-10-CM | POA: Insufficient documentation

## 2021-03-31 DIAGNOSIS — O4700 False labor before 37 completed weeks of gestation, unspecified trimester: Secondary | ICD-10-CM

## 2021-03-31 LAB — URINALYSIS, ROUTINE W REFLEX MICROSCOPIC
Bilirubin Urine: NEGATIVE
Glucose, UA: NEGATIVE mg/dL
Hgb urine dipstick: NEGATIVE
Ketones, ur: NEGATIVE mg/dL
Nitrite: NEGATIVE
Protein, ur: NEGATIVE mg/dL
Specific Gravity, Urine: 1.009 (ref 1.005–1.030)
pH: 6 (ref 5.0–8.0)

## 2021-03-31 MED ORDER — NIFEDIPINE 10 MG PO CAPS
10.0000 mg | ORAL_CAPSULE | ORAL | Status: AC | PRN
  Administered 2021-03-31 (×3): 10 mg via ORAL
  Filled 2021-03-31 (×3): qty 1

## 2021-03-31 MED ORDER — LACTATED RINGERS IV BOLUS
1000.0000 mL | Freq: Once | INTRAVENOUS | Status: AC
  Administered 2021-03-31: 1000 mL via INTRAVENOUS

## 2021-03-31 NOTE — MAU Provider Note (Signed)
History     CSN: [id number]  Arrival date and time: 03/31/21 1654   Event Date/Time   First Provider Initiated Contact with Patient 03/31/21 1755      Chief Complaint  Patient presents with   Contractions   HPI Nicole Schaefer is a 20 y.o. G2P1001 at 59w5dwho presents with abdominal pain. She states she was at work and had sharp pains around 1500. She states the sharp pains have resolved but she is now feeling pressure and cramping "every once in a while." She denies any bleeding or leaking. Reports normal fetal movement. She states she has not drank much water today or eaten.   OB History     Gravida  2   Para  1   Term  1   Preterm      AB      Living  1      SAB      IAB      Ectopic      Multiple  0   Live Births  1           Past Medical History:  Diagnosis Date   Asthma    has been a while since inhaler/neb use    Varicose vein of leg     Past Surgical History:  Procedure Laterality Date   LEG SURGERY Left     Family History  Problem Relation Age of Onset   Healthy Mother    Healthy Father    Hypertension Father    Hyperlipidemia Father    Sleep apnea Father    Diabetes Paternal Grandmother    Heart attack Paternal Grandmother     Social History   Tobacco Use   Smoking status: Never   Smokeless tobacco: Never  Vaping Use   Vaping Use: Never used  Substance Use Topics   Alcohol use: No   Drug use: No    Allergies:  Allergies  Allergen Reactions   Pollen Extract     Runny nose    Medications Prior to Admission  Medication Sig Dispense Refill Last Dose   aspirin EC 81 MG tablet Take 1 tablet (81 mg total) by mouth daily. Swallow whole. 30 tablet 7 03/31/2021   Blood Pressure Monitoring (BLOOD PRESSURE KIT) DEVI 1 Device by Does not apply route as needed. 1 each 0 Past Week   Cholecalciferol (VITAMIN D3) 1.25 MG (50000 UT) CAPS Take 1 capsule by mouth once a week.   03/31/2021   FEROSUL 325 (65 Fe) MG tablet Take by  mouth.   03/31/2021   loratadine (CLARITIN) 10 MG tablet Take 10 mg by mouth daily.   03/31/2021   Prenatal Vit-Fe Fumarate-FA (PRENATAL VITAMINS) 28-0.8 MG TABS Take by mouth.   03/31/2021   Accu-Chek Softclix Lancets lancets Use as instructed 100 each 12    albuterol (VENTOLIN HFA) 108 (90 Base) MCG/ACT inhaler Inhale into the lungs every 6 (six) hours as needed.   More than a month   Blood Glucose Monitoring Suppl (ACCU-CHEK GUIDE) w/Device KIT 1 Device by Does not apply route 4 (four) times daily. 1 kit 0    Blood Pressure Monitor KIT 1 Device by Does not apply route once a week. To be monitored Regularly at home. 1 kit 0    fluticasone (FLOVENT HFA) 44 MCG/ACT inhaler Inhale into the lungs 2 (two) times daily.   More than a month   glucose blood (ACCU-CHEK GUIDE) test strip Use to check blood sugars four times  a day was instructed 50 each 12    Mometasone Furo-Formoterol Fum 50-5 MCG/ACT AERO Inhale into the lungs. (Patient not taking: Reported on 03/28/2021)      terconazole (TERAZOL 7) 0.4 % vaginal cream Place 1 applicator vaginally at bedtime. Use for seven days 45 g 0     Review of Systems  Constitutional: Negative.  Negative for fatigue and fever.  HENT: Negative.    Respiratory: Negative.  Negative for shortness of breath.   Cardiovascular: Negative.  Negative for chest pain.  Gastrointestinal:  Positive for abdominal pain. Negative for constipation, diarrhea, nausea and vomiting.  Genitourinary: Negative.  Negative for dysuria, vaginal bleeding and vaginal discharge.  Neurological: Negative.  Negative for dizziness and headaches.  Physical Exam   Blood pressure (!) 106/58, pulse 89, temperature 98.1 F (36.7 C), temperature source Oral, resp. rate 17, last menstrual period 06/23/2020, SpO2 98 %, not currently breastfeeding.  Physical Exam Vitals and nursing note reviewed.  Constitutional:      General: She is not in acute distress.    Appearance: She is well-developed.   HENT:     Head: Normocephalic.  Eyes:     Pupils: Pupils are equal, round, and reactive to light.  Cardiovascular:     Rate and Rhythm: Normal rate and regular rhythm.     Heart sounds: Normal heart sounds.  Pulmonary:     Effort: Pulmonary effort is normal. No respiratory distress.     Breath sounds: Normal breath sounds.  Abdominal:     General: Bowel sounds are normal. There is no distension.     Palpations: Abdomen is soft.     Tenderness: There is no abdominal tenderness.  Genitourinary:    Comments: NO bleeding on exam Skin:    General: Skin is warm and dry.  Neurological:     Mental Status: She is alert and oriented to person, place, and time.  Psychiatric:        Mood and Affect: Mood normal.        Behavior: Behavior normal.        Thought Content: Thought content normal.        Judgment: Judgment normal.   Fetal Tracing:  Baseline: 135 Variability: moderate Accels: 15x15 Decels: none  Toco: 1-10    Dilation: Closed Exam by:: Len Blalock, CNM    MAU Course  Procedures Results for orders placed or performed during the hospital encounter of 03/31/21 (from the past 24 hour(s))  Urinalysis, Routine w reflex microscopic Urine, Clean Catch     Status: Abnormal   Collection Time: 03/31/21  5:55 PM  Result Value Ref Range   Color, Urine STRAW (A) YELLOW   APPearance CLEAR CLEAR   Specific Gravity, Urine 1.009 1.005 - 1.030   pH 6.0 5.0 - 8.0   Glucose, UA NEGATIVE NEGATIVE mg/dL   Hgb urine dipstick NEGATIVE NEGATIVE   Bilirubin Urine NEGATIVE NEGATIVE   Ketones, ur NEGATIVE NEGATIVE mg/dL   Protein, ur NEGATIVE NEGATIVE mg/dL   Nitrite NEGATIVE NEGATIVE   Leukocytes,Ua TRACE (A) NEGATIVE   RBC / HPF 0-5 0 - 5 RBC/hpf   WBC, UA 0-5 0 - 5 WBC/hpf   Bacteria, UA RARE (A) NONE SEEN   Squamous Epithelial / LPF 0-5 0 - 5   Mucus PRESENT     MDM UA LR bolus Procardia series Contractions resolved, cervix unchanged and patient no longer reporting  cramping.  Discussed importance of PO hydration in pregnancy to prevent uterine contractions  Assessment  and Plan   1. Preterm contractions   2. [redacted] weeks gestation of pregnancy    -Discharge home in stable condition -Preterm labor precautions discussed -Patient advised to follow-up with OB as scheduled for prenatal care -Patient may return to MAU as needed or if her condition were to change or worsen   [place] 03/31/2021, 5:55 PM

## 2021-03-31 NOTE — MAU Note (Addendum)
...  Nicole Schaefer is a 20 y.o. at [redacted]w[redacted]d here in MAU reporting: She states while she was at work today she started feeling "sharp" CTX that were every five minutes. She states she thought they were initially braxton hicks but got concerned when she went to use the restroom and noticed blood in the toilet and when she wiped she saw "light red" blood. She states she has not felt the sharp pains since 1500 today. She states she is only feeling "light cramps" under her right lower rib cage currently and rates them 5/10. Denies seeing any blood clots. +FM. No LOF. Last IC two weeks ago.  States she has not been able to eat or drink much today. She states she has felt nauseous off and on.  Works at [employer] and walks a lot for her job.  FHT: 155 initial external Lab orders placed from triage: UA

## 2021-03-31 NOTE — Discharge Instructions (Signed)

## 2021-04-09 ENCOUNTER — Encounter: Payer: Self-pay | Admitting: Obstetrics

## 2021-04-09 ENCOUNTER — Encounter: Payer: Self-pay | Admitting: *Deleted

## 2021-04-11 ENCOUNTER — Other Ambulatory Visit: Payer: Self-pay

## 2021-04-11 ENCOUNTER — Encounter: Payer: Self-pay | Admitting: Obstetrics

## 2021-04-11 ENCOUNTER — Ambulatory Visit (INDEPENDENT_AMBULATORY_CARE_PROVIDER_SITE_OTHER): Payer: Medicaid Other | Admitting: Obstetrics

## 2021-04-11 VITALS — BP 100/67 | HR 94 | Wt 220.0 lb

## 2021-04-11 DIAGNOSIS — K219 Gastro-esophageal reflux disease without esophagitis: Secondary | ICD-10-CM

## 2021-04-11 DIAGNOSIS — Z23 Encounter for immunization: Secondary | ICD-10-CM

## 2021-04-11 DIAGNOSIS — O99210 Obesity complicating pregnancy, unspecified trimester: Secondary | ICD-10-CM

## 2021-04-11 DIAGNOSIS — Z3480 Encounter for supervision of other normal pregnancy, unspecified trimester: Secondary | ICD-10-CM

## 2021-04-11 DIAGNOSIS — M549 Dorsalgia, unspecified: Secondary | ICD-10-CM

## 2021-04-11 MED ORDER — CYCLOBENZAPRINE HCL 10 MG PO TABS: 10.0000 mg | ORAL_TABLET | Freq: Three times a day (TID) | ORAL | 2 refills | Status: DC | PRN

## 2021-04-11 MED ORDER — PANTOPRAZOLE SODIUM 40 MG PO TBEC
40.0000 mg | DELAYED_RELEASE_TABLET | Freq: Every day | ORAL | 5 refills | Status: DC
Start: 2021-04-11 — End: 2021-04-25

## 2021-04-11 NOTE — Progress Notes (Signed)
ROB, FLU vaccine given RD, tolerated well.  Patient is requesting to be placed on bedrest from job.  C/o pressure and pain.

## 2021-04-11 NOTE — Progress Notes (Signed)
Subjective:  Nicole Schaefer is a 20 y.o. G2P1001 at [redacted]w[redacted]d being seen today for ongoing prenatal care.  She is currently monitored for the following issues for this low-risk pregnancy and has Prediabetes; Arteriovenous malformation; Exercise induced bronchospasm; Perennial allergic rhinitis; History of gestational hypertension; Encounter for supervision of normal pregnancy in first trimester; and Gestational diabetes on their problem list.  Patient reports backache, heartburn, and pelvic pressure .  Contractions: Not present. Vag. Bleeding: None.  Movement: Present. Denies leaking of fluid.   The following portions of the patient's history were reviewed and updated as appropriate: allergies, current medications, past family history, past medical history, past social history, past surgical history and problem list. Problem list updated.   Objective:   Vitals:   04/11/21 0946  BP: 100/67  Pulse: 94  Weight: 220 lb (99.8 kg)    Fetal Status: Fetal Heart Rate (bpm): 145   Movement: Present     General:  Alert, oriented and cooperative. Patient is in no acute distress.  Skin: Skin is warm and dry. No rash noted.   Cardiovascular: Normal heart rate noted  Respiratory: Normal respiratory effort, no problems with respiration noted  Abdomen: Soft, gravid, appropriate for gestational age. Pain/Pressure: Present     Pelvic:  Cervical exam deferred        Extremities: Normal range of motion.  Edema: None  Mental Status: Normal mood and affect. Normal behavior. Normal judgment and thought content.   Urinalysis:      Assessment and Plan:  Pregnancy: G2P1001 at [redacted]w[redacted]d  1. Supervision of other normal pregnancy, antepartum Rx: - Flu Vaccine QUAD 36+ mos IM (Fluarix, Quad PF)  2. Gastroesophageal reflux disease without esophagitis Rx: - pantoprazole (PROTONIX) 40 MG tablet; Take 1 tablet (40 mg total) by mouth daily.  Dispense: 30 tablet; Refill: 5  3. Backache symptom Rx: - cyclobenzaprine  (FLEXERIL) 10 MG tablet; Take 1 tablet (10 mg total) by mouth every 8 (eight) hours as needed for muscle spasms.  Dispense: 30 tablet; Refill: 2  4. Obesity affecting pregnancy, antepartum    Preterm labor symptoms and general obstetric precautions including but not limited to vaginal bleeding, contractions, leaking of fluid and fetal movement were reviewed in detail with the patient. Please refer to After Visit Summary for other counseling recommendations.   Return in about 2 weeks (around 04/25/2021) for ROB.   Brock Bad, MD  04/11/21

## 2021-04-25 ENCOUNTER — Encounter: Payer: Self-pay | Admitting: Obstetrics

## 2021-04-25 ENCOUNTER — Ambulatory Visit: Payer: Medicaid Other | Admitting: *Deleted

## 2021-04-25 ENCOUNTER — Other Ambulatory Visit: Payer: Self-pay

## 2021-04-25 ENCOUNTER — Encounter: Payer: Self-pay | Admitting: *Deleted

## 2021-04-25 ENCOUNTER — Ambulatory Visit: Payer: Medicaid Other | Attending: Maternal & Fetal Medicine

## 2021-04-25 ENCOUNTER — Ambulatory Visit (INDEPENDENT_AMBULATORY_CARE_PROVIDER_SITE_OTHER): Payer: Medicaid Other | Admitting: Obstetrics

## 2021-04-25 VITALS — BP 117/59 | HR 100

## 2021-04-25 VITALS — Wt 224.2 lb

## 2021-04-25 DIAGNOSIS — Z3481 Encounter for supervision of other normal pregnancy, first trimester: Secondary | ICD-10-CM

## 2021-04-25 DIAGNOSIS — O24419 Gestational diabetes mellitus in pregnancy, unspecified control: Secondary | ICD-10-CM | POA: Insufficient documentation

## 2021-04-25 DIAGNOSIS — K219 Gastro-esophageal reflux disease without esophagitis: Secondary | ICD-10-CM

## 2021-04-25 DIAGNOSIS — Z3A35 35 weeks gestation of pregnancy: Secondary | ICD-10-CM | POA: Diagnosis not present

## 2021-04-25 DIAGNOSIS — R519 Headache, unspecified: Secondary | ICD-10-CM

## 2021-04-25 DIAGNOSIS — O24410 Gestational diabetes mellitus in pregnancy, diet controlled: Secondary | ICD-10-CM | POA: Diagnosis not present

## 2021-04-25 DIAGNOSIS — O99213 Obesity complicating pregnancy, third trimester: Secondary | ICD-10-CM

## 2021-04-25 DIAGNOSIS — O09293 Supervision of pregnancy with other poor reproductive or obstetric history, third trimester: Secondary | ICD-10-CM | POA: Diagnosis not present

## 2021-04-25 DIAGNOSIS — O99210 Obesity complicating pregnancy, unspecified trimester: Secondary | ICD-10-CM

## 2021-04-25 DIAGNOSIS — Z3480 Encounter for supervision of other normal pregnancy, unspecified trimester: Secondary | ICD-10-CM

## 2021-04-25 MED ORDER — BUTALBITAL-APAP-CAFFEINE 50-325-40 MG PO TABS: 2.0000 | ORAL_TABLET | Freq: Four times a day (QID) | ORAL | 1 refills | Status: DC | PRN

## 2021-04-25 MED ORDER — OMEPRAZOLE 20 MG PO CPDR: 20.0000 mg | DELAYED_RELEASE_CAPSULE | Freq: Two times a day (BID) | ORAL | 2 refills | Status: DC

## 2021-04-25 NOTE — Progress Notes (Signed)
Subjective:  Nicole Schaefer is a 20 y.o. G2P1001 at [redacted]w[redacted]d being seen today for ongoing prenatal care.  She is currently monitored for the following issues for this low-risk pregnancy and has Prediabetes; Arteriovenous malformation; Exercise induced bronchospasm; Perennial allergic rhinitis; History of gestational hypertension; Encounter for supervision of normal pregnancy in first trimester; and Gestational diabetes on their problem list.  Patient reports headache and heartburn.  Contractions: Irritability. Vag. Bleeding: None.  Movement: Present. Denies leaking of fluid.   The following portions of the patient's history were reviewed and updated as appropriate: allergies, current medications, past family history, past medical history, past social history, past surgical history and problem list. Problem list updated.  Objective:   Vitals:   04/25/21 1423  Weight: 224 lb 3.2 oz (101.7 kg)    Fetal Status:     Movement: Present     General:  Alert, oriented and cooperative. Patient is in no acute distress.  Skin: Skin is warm and dry. No rash noted.   Cardiovascular: Normal heart rate noted  Respiratory: Normal respiratory effort, no problems with respiration noted  Abdomen: Soft, gravid, appropriate for gestational age. Pain/Pressure: Present     Pelvic:  Cervical exam deferred        Extremities: Normal range of motion.  Edema: None  Mental Status: Normal mood and affect. Normal behavior. Normal judgment and thought content.   Urinalysis:      Assessment and Plan:  Pregnancy: G2P1001 at [redacted]w[redacted]d  1. Supervision of other normal pregnancy, antepartum  2. Obesity affecting pregnancy, antepartum  3. Gastroesophageal reflux disease without esophagitis Rx: - omeprazole (PRILOSEC) 20 MG capsule; Take 1 capsule (20 mg total) by mouth 2 (two) times daily before a meal.  Dispense: 60 capsule; Refill: 2  4. Acute intractable headache, unspecified headache type Rx: -  butalbital-acetaminophen-caffeine (FIORICET) 50-325-40 MG tablet; Take 2 tablets by mouth every 6 (six) hours as needed for headache.  Dispense: 30 tablet; Refill: 1    Preterm labor symptoms and general obstetric precautions including but not limited to vaginal bleeding, contractions, leaking of fluid and fetal movement were reviewed in detail with the patient. Please refer to After Visit Summary for other counseling recommendations.   Return in about 1 week (around 05/02/2021) for ROB.   Brock Bad, MD  04/25/21

## 2021-04-25 NOTE — Progress Notes (Signed)
ROB 35.[redacted]wks GA Reports cont'd heartburn despite meds Reports headaches, not responding to tylenol Denies visual changes or dizziness

## 2021-04-30 ENCOUNTER — Inpatient Hospital Stay (HOSPITAL_COMMUNITY)
Admission: AD | Admit: 2021-04-30 | Discharge: 2021-04-30 | Disposition: A | Discharge status: Home / Self Care | Payer: Medicaid Other | Attending: Family Medicine | Admitting: Family Medicine

## 2021-04-30 ENCOUNTER — Telehealth: Payer: Self-pay

## 2021-04-30 ENCOUNTER — Encounter (HOSPITAL_COMMUNITY): Payer: Self-pay | Admitting: Obstetrics & Gynecology

## 2021-04-30 DIAGNOSIS — R519 Headache, unspecified: Secondary | ICD-10-CM | POA: Diagnosis not present

## 2021-04-30 DIAGNOSIS — O26893 Other specified pregnancy related conditions, third trimester: Secondary | ICD-10-CM | POA: Insufficient documentation

## 2021-04-30 DIAGNOSIS — Z3A36 36 weeks gestation of pregnancy: Secondary | ICD-10-CM | POA: Diagnosis not present

## 2021-04-30 DIAGNOSIS — R030 Elevated blood-pressure reading, without diagnosis of hypertension: Secondary | ICD-10-CM | POA: Insufficient documentation

## 2021-04-30 LAB — CBC
HCT: 35.1 % — ABNORMAL LOW (ref 36.0–46.0)
Hemoglobin: 11.5 g/dL — ABNORMAL LOW (ref 12.0–15.0)
MCH: 27.3 pg (ref 26.0–34.0)
MCHC: 32.8 g/dL (ref 30.0–36.0)
MCV: 83.2 fL (ref 80.0–100.0)
Platelets: 193 10*3/uL (ref 150–400)
RBC: 4.22 MIL/uL (ref 3.87–5.11)
RDW: 16 % — ABNORMAL HIGH (ref 11.5–15.5)
WBC: 7.4 10*3/uL (ref 4.0–10.5)
nRBC: 0 % (ref 0.0–0.2)

## 2021-04-30 LAB — COMPREHENSIVE METABOLIC PANEL
ALT: 12 U/L (ref 0–44)
AST: 20 U/L (ref 15–41)
Albumin: 2.8 g/dL — ABNORMAL LOW (ref 3.5–5.0)
Alkaline Phosphatase: 151 U/L — ABNORMAL HIGH (ref 38–126)
Anion gap: 11 (ref 5–15)
BUN: 6 mg/dL (ref 6–20)
CO2: 19 mmol/L — ABNORMAL LOW (ref 22–32)
Calcium: 8.9 mg/dL (ref 8.9–10.3)
Chloride: 107 mmol/L (ref 98–111)
Creatinine, Ser: 0.66 mg/dL (ref 0.44–1.00)
GFR, Estimated: >60 mL/min (ref >60)
Glucose, Bld: 100 mg/dL — ABNORMAL HIGH (ref 70–99)
Potassium: 3.7 mmol/L (ref 3.5–5.1)
Sodium: 137 mmol/L (ref 135–145)
Total Bilirubin: 0.4 mg/dL (ref 0.3–1.2)
Total Protein: 6.4 g/dL — ABNORMAL LOW (ref 6.5–8.1)

## 2021-04-30 LAB — PROTEIN / CREATININE RATIO, URINE
Creatinine, Urine: 98.59 mg/dL
Protein Creatinine Ratio: 0.11 mg/mg{Cre} (ref 0.00–0.15)
Total Protein, Urine: 11 mg/dL

## 2021-04-30 MED ORDER — CYCLOBENZAPRINE HCL 10 MG PO TABS: 5.0000 mg | ORAL_TABLET | Freq: Three times a day (TID) | ORAL | 0 refills | Status: DC | PRN

## 2021-04-30 NOTE — MAU Note (Incomplete)
Hx of high blood pressure with first pregnancy.  Has been checking it at home.  Was elevated today, first time this pregnancy.  Has daily headache, took the Tylenol and the prescribed med, neither worked. Is seeing "stars", pain in RUQ, constant today- first noted a few days ago. Seems like feet are swelling.

## 2021-04-30 NOTE — Telephone Encounter (Signed)
Spoke with patient after several MyChart messages regarding blood pressure. Pt states that she had elevated BP readings at home, 165/92 and 161/92. Pt states she has been having HA, swelling in the hands and feet, RUQ pain and "seeing stars" for the past few days. Pt does have hx of PreE with last pregnancy. Given patient's symptoms and history, advised patient to go to hospital for evaluation and monitoring. Pt agreed and verbalized understanding.

## 2021-04-30 NOTE — MAU Provider Note (Signed)
History     CSN: [id number]  Arrival date and time: 04/30/21 1543   Event Date/Time   First Provider Initiated Contact with Patient 04/30/21 1708       Chief Complaint  Patient presents with   Hypertension   Headache   seeing spots   HPI 20yo [id number] pregnancy. She presents with swelling, headache, spots in vision, RUQ pain. Headache has been going on for two weeks - not better with tylenol or fioricet. Nonradiating RUQ abd pain started a couple days ago - no palliating or provoking factors. Her BP at home was 165/92 and 161/92. She called her doctor's office and they sent her here for evaluation.  OB History     Gravida  2   Para  1   Term  1   Preterm      AB      Living  1      SAB      IAB      Ectopic      Multiple  0   Live Births  1           Past Medical History:  Diagnosis Date   Asthma    has been a while since inhaler/neb use    Varicose vein of leg     Past Surgical History:  Procedure Laterality Date   LEG SURGERY Left     Family History  Problem Relation Age of Onset   Healthy Mother    Healthy Father    Hypertension Father    Hyperlipidemia Father    Sleep apnea Father    Diabetes Paternal Grandmother    Heart attack Paternal Grandmother     Social History   Tobacco Use   Smoking status: Never   Smokeless tobacco: Never  Vaping Use   Vaping Use: Never used  Substance Use Topics   Alcohol use: No   Drug use: No    Allergies:  Allergies  Allergen Reactions   Pollen Extract     Runny nose    Medications Prior to Admission  Medication Sig Dispense Refill Last Dose   aspirin EC 81 MG tablet Take 1 tablet (81 mg total) by mouth daily. Swallow whole. 30 tablet 7 04/29/2021   butalbital-acetaminophen-caffeine (FIORICET) 50-325-40 MG tablet Take 2 tablets by mouth every 6 (six) hours as needed for headache. 30 tablet 1 04/30/2021   Cholecalciferol (VITAMIN D3) 1.25 MG (50000 UT) CAPS Take 1  capsule by mouth once a week.   04/30/2021   FEROSUL 325 (65 Fe) MG tablet Take by mouth.   04/30/2021   loratadine (CLARITIN) 10 MG tablet Take 10 mg by mouth daily.   04/30/2021   omeprazole (PRILOSEC) 20 MG capsule Take 1 capsule (20 mg total) by mouth 2 (two) times daily before a meal. 60 capsule 2 04/30/2021   Prenatal Vit-Fe Fumarate-FA (PRENATAL VITAMINS) 28-0.8 MG TABS Take by mouth.   04/30/2021   Accu-Chek Softclix Lancets lancets Use as instructed (Patient not taking: Reported on 04/25/2021) 100 each 12    albuterol (VENTOLIN HFA) 108 (90 Base) MCG/ACT inhaler Inhale into the lungs every 6 (six) hours as needed. (Patient not taking: Reported on 04/25/2021)      Blood Glucose Monitoring Suppl (ACCU-CHEK GUIDE) w/Device KIT 1 Device by Does not apply route 4 (four) times daily. (Patient not taking: Reported on 04/25/2021) 1 kit 0    Blood Pressure Monitor KIT 1 Device by Does not apply route once a week.  To be monitored Regularly at home. 1 kit 0    Blood Pressure Monitoring (BLOOD PRESSURE KIT) DEVI 1 Device by Does not apply route as needed. 1 each 0    cyclobenzaprine (FLEXERIL) 10 MG tablet Take 1 tablet (10 mg total) by mouth every 8 (eight) hours as needed for muscle spasms. (Patient not taking: Reported on 04/25/2021) 30 tablet 2    fluticasone (FLOVENT HFA) 44 MCG/ACT inhaler Inhale into the lungs 2 (two) times daily.   Unknown   glucose blood (ACCU-CHEK GUIDE) test strip Use to check blood sugars four times a day was instructed (Patient not taking: Reported on 04/25/2021) 50 each 12    Mometasone Furo-Formoterol Fum 50-5 MCG/ACT AERO Inhale into the lungs. (Patient not taking: No sig reported)      terconazole (TERAZOL 7) 0.4 % vaginal cream Place 1 applicator vaginally at bedtime. Use for seven days 45 g 0     Review of Systems Physical Exam   Blood pressure 118/67, pulse 90, temperature 98.5 F (36.9 C), temperature source Oral, resp. rate 18, height _0  (1.6 m), weight 102.4  kg, last menstrual period 06/23/2020, SpO2 98 %, not currently breastfeeding.  Patient Vitals for the past 24 hrs:  BP Temp Temp src Pulse Resp SpO2 Height Weight  04/30/21 1820 -- -- -- -- -- 98 % -- --  04/30/21 1816 118/67 -- -- 90 -- -- -- --  04/30/21 1815 -- -- -- -- -- 98 % -- --  04/30/21 1810 -- -- -- -- -- 99 % -- --  04/30/21 1805 -- -- -- -- -- 99 % -- --  04/30/21 1801 121/75 -- -- 96 -- -- -- --  04/30/21 1800 -- -- -- -- -- 98 % -- --  04/30/21 1755 -- -- -- -- -- 98 % -- --  04/30/21 1750 -- -- -- -- -- 97 % -- --  04/30/21 1746 (!) 108/58 -- -- 90 -- -- -- --  04/30/21 1745 -- -- -- -- -- 98 % -- --  04/30/21 1740 -- -- -- -- -- 100 % -- --  04/30/21 1735 -- -- -- -- -- 98 % -- --  04/30/21 1731 126/73 -- -- 89 -- -- -- --  04/30/21 1730 -- -- -- -- -- 98 % -- --  04/30/21 1725 -- -- -- -- -- 98 % -- --  04/30/21 1720 -- -- -- -- -- 98 % -- --  04/30/21 1716 117/65 -- -- 94 -- -- -- --  04/30/21 1715 -- -- -- -- -- 99 % -- --  04/30/21 1710 -- -- -- -- -- 99 % -- --  04/30/21 1706 132/76 -- -- 93 -- -- -- --  04/30/21 1705 -- -- -- -- -- 99 % -- --  04/30/21 1701 129/73 -- -- 98 -- -- -- --  04/30/21 1700 -- -- -- -- -- 100 % -- --  04/30/21 1655 -- -- -- -- -- 100 % -- --  04/30/21 1650 -- -- -- -- -- 100 % -- --  04/30/21 1645 122/69 -- -- 98 -- 100 % -- --  04/30/21 1616 120/64 98.5 F (36.9 C) Oral 93 18 98 % _1  (1.6 m) 102.4 kg     Physical Exam Vitals and nursing note reviewed.  Constitutional:      Appearance: She is well-developed.  HENT:     Head: Normocephalic and atraumatic.  Eyes:     Extraocular Movements: Extraocular movements  intact.  Cardiovascular:     Rate and Rhythm: Normal rate and regular rhythm.  Pulmonary:     Effort: Pulmonary effort is normal.     Breath sounds: Normal breath sounds.  Abdominal:     General: There is no distension.     Palpations: Abdomen is soft. There is no mass.     Tenderness: There is abdominal  tenderness (RUQ). There is no guarding.  Neurological:     Mental Status: She is alert.   Results for orders placed or performed during the hospital encounter of 04/30/21 (from the past 24 hour(s))  CBC     Status: Abnormal   Collection Time: 04/30/21  4:16 PM  Result Value Ref Range   WBC 7.4 4.0 - 10.5 K/uL   RBC 4.22 3.87 - 5.11 MIL/uL   Hemoglobin 11.5 (L) 12.0 - 15.0 g/dL   HCT 35.1 (L) 36.0 - 46.0 %   MCV 83.2 80.0 - 100.0 fL   MCH 27.3 26.0 - 34.0 pg   MCHC 32.8 30.0 - 36.0 g/dL   RDW 16.0 (H) 11.5 - 15.5 %   Platelets 193 150 - 400 K/uL   nRBC 0.0 0.0 - 0.2 %  Comprehensive metabolic panel     Status: Abnormal   Collection Time: 04/30/21  4:16 PM  Result Value Ref Range   Sodium 137 135 - 145 mmol/L   Potassium 3.7 3.5 - 5.1 mmol/L   Chloride 107 98 - 111 mmol/L   CO2 19 (L) 22 - 32 mmol/L   Glucose, Bld 100 (H) 70 - 99 mg/dL   BUN 6 6 - 20 mg/dL   Creatinine, Ser 0.66 0.44 - 1.00 mg/dL   Calcium 8.9 8.9 - 10.3 mg/dL   Total Protein 6.4 (L) 6.5 - 8.1 g/dL   Albumin 2.8 (L) 3.5 - 5.0 g/dL   AST 20 15 - 41 U/L   ALT 12 0 - 44 U/L   Alkaline Phosphatase 151 (H) 38 - 126 U/L   Total Bilirubin 0.4 0.3 - 1.2 mg/dL   GFR, Estimated >60 >60 mL/min   Anion gap 11 5 - 15  Protein / creatinine ratio, urine     Status: None   Collection Time: 04/30/21  4:18 PM  Result Value Ref Range   Creatinine, Urine 98.59 mg/dL   Total Protein, Urine 11 mg/dL   Protein Creatinine Ratio 0.11 0.00 - 0.15 mg/mg[Cre]     MAU Course  Procedures NST:  Baseline: 140  Variability: moderate Accelerations: ++  Decelerations: none Contractions: none   MDM Labs normal Serial BPs normal.   Assessment and Plan   1. [redacted] weeks gestation of pregnancy   2. Nonintractable headache, unspecified chronicity pattern, unspecified headache type    Discharge to home. Will give flexeril for headache.  Truett Mainland 04/30/2021, 6:33 PM

## 2021-05-01 ENCOUNTER — Telehealth: Payer: Self-pay

## 2021-05-01 NOTE — Telephone Encounter (Signed)
Pt called asking about lab results from visit to MAU yesterday. Advised they looked fairly good. A few things were abnormal but not too worrisome. Pt also wanted to know why she was having swelling. Advised it could be fluid retention or that she works a job where she is on her feet. Advised the use of compression stockings to help with this. She was also concerned about the HA and why she continues to have this. Advised patient that I did not really have an answer for that but to try the flexeril that she was given at the hospital. Also advised patient to make sure she is staying hydrated. Pt also had questions about induction. Told patient she will need to discuss that with the provider when she come in for OV. She did request note for work to be able to wear scrubs. Will provide note for patient. Pt agreed and verbalized understanding with our conversation.

## 2021-05-02 ENCOUNTER — Other Ambulatory Visit: Payer: Self-pay

## 2021-05-02 ENCOUNTER — Other Ambulatory Visit (HOSPITAL_COMMUNITY)
Admission: RE | Admit: 2021-05-02 | Discharge: 2021-05-02 | Disposition: A | Discharge status: Home / Self Care | Payer: Medicaid Other | Source: Ambulatory Visit | Attending: Women's Health | Admitting: Women's Health

## 2021-05-02 ENCOUNTER — Ambulatory Visit (INDEPENDENT_AMBULATORY_CARE_PROVIDER_SITE_OTHER): Payer: Medicaid Other | Admitting: Women's Health

## 2021-05-02 VITALS — BP 113/69 | HR 99 | Wt 224.0 lb

## 2021-05-02 DIAGNOSIS — Z3481 Encounter for supervision of other normal pregnancy, first trimester: Secondary | ICD-10-CM

## 2021-05-02 DIAGNOSIS — O26893 Other specified pregnancy related conditions, third trimester: Secondary | ICD-10-CM

## 2021-05-02 DIAGNOSIS — R519 Headache, unspecified: Secondary | ICD-10-CM

## 2021-05-02 DIAGNOSIS — Z3A36 36 weeks gestation of pregnancy: Secondary | ICD-10-CM

## 2021-05-02 LAB — OB RESULTS CONSOLE GC/CHLAMYDIA: Gonorrhea: NEGATIVE

## 2021-05-02 NOTE — Progress Notes (Signed)
Subjective:  Nicole Schaefer is a 20 y.o. G2P1001 at [redacted]w[redacted]d being seen today for ongoing prenatal care.  She is currently monitored for the following issues for this low-risk pregnancy and has Prediabetes; Arteriovenous malformation; Exercise induced bronchospasm; Perennial allergic rhinitis; History of gestational hypertension; Encounter for supervision of normal pregnancy in first trimester; Pregnancy headache in third trimester; and [redacted] weeks gestation of pregnancy on their problem list.  Patient reports no complaints.  Contractions: Not present. Vag. Bleeding: None.  Movement: Present. Denies leaking of fluid.   The following portions of the patient's history were reviewed and updated as appropriate: allergies, current medications, past family history, past medical history, past social history, past surgical history and problem list. Problem list updated.  Objective:   Vitals:   05/02/21 1424  BP: 113/69  Pulse: 99  Weight: 224 lb (101.6 kg)    Fetal Status: Fetal Heart Rate (bpm): 136 Fundal Height: 37 cm Movement: Present  Presentation: Undeterminable  General:  Alert, oriented and cooperative. Patient is in no acute distress.  Skin: Skin is warm and dry. No rash noted.   Cardiovascular: Normal heart rate noted  Respiratory: Normal respiratory effort, no problems with respiration noted  Abdomen: Soft, gravid, appropriate for gestational age. Pain/Pressure: Present     Pelvic: Vag. Bleeding: None     Cervical exam performed Dilation: 2 Effacement (%): Thick Station: Ballotable  Extremities: Normal range of motion.  Edema: None  Mental Status: Normal mood and affect. Normal behavior. Normal judgment and thought content.   Urinalysis:      Assessment and Plan:  Pregnancy: G2P1001 at [redacted]w[redacted]d  1. Encounter for supervision of other normal pregnancy in first trimester - Cervicovaginal ancillary only( [place]) - Culture, beta strep (group b only)  -pt reporting daily, 10/10  headaches. Patient reports headaches have been occurring since 15 weeks of pregnancy. Patient has tried medications prescribed by OB, plus Tylenol and caffeine without relief. Patient reports having been seen by headache specialist x2, but no notes visible in chart. Patient also reports being prescribed daily medication for HA, but this is not visible in chart either. MyChart message sent to patient to inquire where she went to see HA specialist. Patient is requesting to be delivered at 37 weeks d/t daily HA pain. Patient was seen in MAU for HA 04/30/2021 and diagnosed with non-intractable headache and discharged home. Normal BP today.  2. [redacted] weeks gestation of pregnancy  Term labor symptoms and general obstetric precautions including but not limited to vaginal bleeding, contractions, leaking of fluid and fetal movement were reviewed in detail with the patient. I discussed the assessment and treatment plan with the patient. The patient was provided an opportunity to ask questions and all were answered. The patient agreed with the plan and demonstrated an understanding of the instructions. The patient was advised to call back or seek an in-person office evaluation/go to MAU at [facility] for any urgent or concerning symptoms. Please refer to After Visit Summary for other counseling recommendations.  Return in about 1 week (around 05/09/2021) for in-person LOB/MD ONLY/ Discuss early delivery for headaches.   Nicole Schaefer, Nicole Sera, NP

## 2021-05-02 NOTE — Patient Instructions (Signed)
Maternity Assessment Unit (MAU)  The Maternity Assessment Unit (MAU) is located at the [facility] at [facility]. The address is: [address], [address], [place], [place] [address]. Please see map below for additional directions.    The Maternity Assessment Unit is designed to help you during your pregnancy, and for up to 6 weeks after delivery, with any pregnancy- or postpartum-related emergencies, if you think you are in labor, or if your water has broken. For example, if you experience nausea and vomiting, vaginal bleeding, severe abdominal or pelvic pain, elevated blood pressure or other problems related to your pregnancy or postpartum time, please come to the Maternity Assessment Unit for assistance.       Signs and Symptoms of Labor Labor is the body's natural process of moving the baby and the placenta out of the uterus. The process of labor usually starts when the baby is full-term, between 39 and 41 weeks of pregnancy. Signs and symptoms that you are close to going into labor As your body prepares for labor and the birth of your baby, you may notice the following symptoms in the weeks and days before true labor starts: Passing a small amount of thick, bloody mucus from your vagina. This is called normal bloody show or losing your mucus plug. This may happen more than a week before labor begins, or right before labor begins, as the opening of the cervix starts to widen (dilate). For some women, the entire mucus plug passes at once. For others, pieces of the mucus plug may gradually pass over several days. Your baby moving (dropping) lower in your pelvis to get into position for birth (lightening). When this happens, you may feel more pressure on your bladder and pelvic bone and less pressure on your ribs. This may make it easier to breathe. It may also cause you to need to urinate more often and have problems with bowel movements. Having "practice  contractions," also called Braxton Hicks contractions or false labor. These occur at irregular (unevenly spaced) intervals that are more than 10 minutes apart. False labor contractions are common after exercise or sexual activity. They will stop if you change position, rest, or drink fluids. These contractions are usually mild and do not get stronger over time. They may feel like: A backache or back pain. Mild cramps, similar to menstrual cramps. Tightening or pressure in your abdomen. Other early symptoms include: Nausea or loss of appetite. Diarrhea. Having a sudden burst of energy, or feeling very tired. Mood changes. Having trouble sleeping. Signs and symptoms that labor has begun Signs that you are in labor may include: Having contractions that come at regular (evenly spaced) intervals and increase in intensity. This may feel like more intense tightening or pressure in your abdomen that moves to your back. Contractions may also feel like rhythmic pain in your upper thighs or back that comes and goes at regular intervals. If you are delivering for the first time, this change in intensity of contractions often occurs at a more gradual pace. If you have given birth before, you may notice a more rapid progression of contraction changes. Feeling pressure in the vaginal area. Your water breaking (rupture of membranes). This is when the sac of fluid that surrounds your baby breaks. Fluid leaking from your vagina may be clear or blood-tinged. Labor usually starts within 24 hours of your water breaking, but it may take longer to begin. Some people may feel a sudden gush of fluid;   others may notice repeatedly damp underwear. Follow these instructions at home:  When labor starts, or if your water breaks, call your health care provider or nurse care line. Based on your situation, they will determine when you should go in for an exam. During early labor, you may be able to rest and manage symptoms at  home. Some strategies to try at home include: Breathing and relaxation techniques. Taking a warm bath or shower. Listening to music. Using a heating pad on the lower back for pain. If directed, apply heat to the area as often as told by your health care provider. Use the heat source that your health care provider recommends, such as a moist heat pack or a heating pad. Place a towel between your skin and the heat source. Leave the heat on for 20-30 minutes. Remove the heat if your skin turns bright red. This is especially important if you are unable to feel pain, heat, or cold. You have a greater risk of getting burned. Contact a health care provider if: Your labor has started. Your water breaks. You have nausea, vomiting, or diarrhea. Get help right away if: You have painful, regular contractions that are 5 minutes apart or less. Labor starts before you are [redacted] weeks along in your pregnancy. You have a fever. You have bright red blood coming from your vagina. You do not feel your baby moving. You have a severe headache with or without vision problems. You have chest pain or shortness of breath. These symptoms may represent a serious problem that is an emergency. Do not wait to see if the symptoms will go away. Get medical help right away. Call your local emergency services (911 in the [place] Do not drive yourself to the hospital. Summary Labor is your body's natural process of moving your baby and the placenta out of your uterus. The process of labor usually starts when your baby is full-term, between 63 and 40 weeks of pregnancy. When labor starts, or if your water breaks, call your health care provider or nurse care line. Based on your situation, they will determine when you should go in for an exam. This information is not intended to replace advice given to you by your health care provider. Make sure you discuss any questions you have with your health care provider. Document Revised:  10/17/2020 Document Reviewed: 10/17/2020 Elsevier Patient Education  2022 [employer].

## 2021-05-03 ENCOUNTER — Other Ambulatory Visit: Payer: Self-pay | Admitting: Obstetrics and Gynecology

## 2021-05-03 ENCOUNTER — Telehealth: Payer: Self-pay

## 2021-05-03 ENCOUNTER — Encounter: Payer: Self-pay | Admitting: Obstetrics and Gynecology

## 2021-05-03 LAB — CERVICOVAGINAL ANCILLARY ONLY
Chlamydia: NEGATIVE
Comment: NEGATIVE
Comment: NORMAL
Neisseria Gonorrhea: NEGATIVE

## 2021-05-03 MED ORDER — BUTALBITAL-APAP-CAFFEINE 50-325-40 MG PO TABS: 1.0000 | ORAL_TABLET | Freq: Four times a day (QID) | ORAL | 0 refills | Status: DC | PRN

## 2021-05-03 NOTE — Telephone Encounter (Signed)
Pt called this morning wanting to know about possible induction. Advised patient that Dr. Jolayne Panther was not in the office today but that I had forwarded her MyChart message to her and was awaiting a response. Advised patient that I was not allowed to make that decision and that typically inductions were not done before 37 weeks unless there was a medical indication. Pt states that she is having daily HA with blurred vision. BP is normal. She wanted to speak directly to Dr. Jolayne Panther but advised patient she was not in the office. Advised her that I had sent her message and would get back with her as soon as she had gotten back to me.

## 2021-05-04 ENCOUNTER — Telehealth: Payer: Medicaid Other | Admitting: Obstetrics and Gynecology

## 2021-05-06 LAB — CULTURE, BETA STREP (GROUP B ONLY): Strep Gp B Culture: NEGATIVE

## 2021-05-09 ENCOUNTER — Inpatient Hospital Stay (HOSPITAL_COMMUNITY): Payer: Medicaid Other | Admitting: Anesthesiology

## 2021-05-09 ENCOUNTER — Inpatient Hospital Stay (HOSPITAL_COMMUNITY)
Admission: AD | Admit: 2021-05-09 | Discharge: 2021-05-10 | DRG: 807 | Disposition: A | Discharge status: Home / Self Care | Payer: Medicaid Other | Attending: Obstetrics & Gynecology | Admitting: Obstetrics & Gynecology

## 2021-05-09 ENCOUNTER — Encounter (HOSPITAL_COMMUNITY): Payer: Self-pay | Admitting: Obstetrics & Gynecology

## 2021-05-09 ENCOUNTER — Encounter: Payer: Medicaid Other | Admitting: Obstetrics and Gynecology

## 2021-05-09 DIAGNOSIS — J45990 Exercise induced bronchospasm: Secondary | ICD-10-CM | POA: Diagnosis present

## 2021-05-09 DIAGNOSIS — Z20822 Contact with and (suspected) exposure to covid-19: Secondary | ICD-10-CM | POA: Diagnosis not present

## 2021-05-09 DIAGNOSIS — Z3A37 37 weeks gestation of pregnancy: Secondary | ICD-10-CM

## 2021-05-09 DIAGNOSIS — J45909 Unspecified asthma, uncomplicated: Secondary | ICD-10-CM | POA: Diagnosis present

## 2021-05-09 DIAGNOSIS — R7303 Prediabetes: Secondary | ICD-10-CM | POA: Diagnosis not present

## 2021-05-09 DIAGNOSIS — O99214 Obesity complicating childbirth: Secondary | ICD-10-CM | POA: Diagnosis present

## 2021-05-09 DIAGNOSIS — Z8759 Personal history of other complications of pregnancy, childbirth and the puerperium: Secondary | ICD-10-CM

## 2021-05-09 DIAGNOSIS — O9952 Diseases of the respiratory system complicating childbirth: Secondary | ICD-10-CM | POA: Diagnosis not present

## 2021-05-09 DIAGNOSIS — O2693 Pregnancy related conditions, unspecified, third trimester: Secondary | ICD-10-CM | POA: Diagnosis not present

## 2021-05-09 DIAGNOSIS — Z7982 Long term (current) use of aspirin: Secondary | ICD-10-CM

## 2021-05-09 DIAGNOSIS — Z3481 Encounter for supervision of other normal pregnancy, first trimester: Secondary | ICD-10-CM

## 2021-05-09 DIAGNOSIS — Z3480 Encounter for supervision of other normal pregnancy, unspecified trimester: Secondary | ICD-10-CM

## 2021-05-09 DIAGNOSIS — O26893 Other specified pregnancy related conditions, third trimester: Secondary | ICD-10-CM | POA: Diagnosis not present

## 2021-05-09 DIAGNOSIS — O99892 Other specified diseases and conditions complicating childbirth: Principal | ICD-10-CM | POA: Diagnosis present

## 2021-05-09 HISTORY — DX: Headache, unspecified: R51.9

## 2021-05-09 LAB — RESP PANEL BY RT-PCR (FLU A&B, COVID) ARPGX2
Influenza A by PCR: NEGATIVE
Influenza B by PCR: NEGATIVE
SARS Coronavirus 2 by RT PCR: NEGATIVE

## 2021-05-09 LAB — TYPE AND SCREEN
ABO/RH(D): B POS
Antibody Screen: NEGATIVE

## 2021-05-09 LAB — CBC
HCT: 36.5 % (ref 36.0–46.0)
Hemoglobin: 11.9 g/dL — ABNORMAL LOW (ref 12.0–15.0)
MCH: 27.2 pg (ref 26.0–34.0)
MCHC: 32.6 g/dL (ref 30.0–36.0)
MCV: 83.5 fL (ref 80.0–100.0)
Platelets: 209 10*3/uL (ref 150–400)
RBC: 4.37 MIL/uL (ref 3.87–5.11)
RDW: 15.5 % (ref 11.5–15.5)
WBC: 7 10*3/uL (ref 4.0–10.5)
nRBC: 0 % (ref 0.0–0.2)

## 2021-05-09 LAB — RPR: RPR Ser Ql: NONREACTIVE

## 2021-05-09 MED ORDER — TETANUS-DIPHTH-ACELL PERTUSSIS 5-2.5-18.5 LF-MCG/0.5 IM SUSY: 0.5000 mL | PREFILLED_SYRINGE | Freq: Once | INTRAMUSCULAR | Status: DC

## 2021-05-09 MED ORDER — ACETAMINOPHEN 325 MG PO TABS
650.0000 mg | ORAL_TABLET | ORAL | Status: DC | PRN
  Administered 2021-05-09: 975 mg via ORAL
  Filled 2021-05-09: qty 2

## 2021-05-09 MED ORDER — LIDOCAINE HCL (PF) 1 % IJ SOLN: 30.0000 mL | INTRAMUSCULAR | Status: DC | PRN

## 2021-05-09 MED ORDER — IBUPROFEN 600 MG PO TABS
600.0000 mg | ORAL_TABLET | Freq: Four times a day (QID) | ORAL | Status: DC
  Administered 2021-05-09 – 2021-05-10 (×4): 600 mg via ORAL
  Filled 2021-05-09 (×4): qty 1

## 2021-05-09 MED ORDER — SODIUM CHLORIDE 0.9 % IV SOLN: 250.0000 mL | INTRAVENOUS | Status: DC | PRN

## 2021-05-09 MED ORDER — DIPHENHYDRAMINE HCL 25 MG PO CAPS: 25.0000 mg | ORAL_CAPSULE | Freq: Four times a day (QID) | ORAL | Status: DC | PRN

## 2021-05-09 MED ORDER — SENNOSIDES-DOCUSATE SODIUM 8.6-50 MG PO TABS
2.0000 | ORAL_TABLET | ORAL | Status: DC
  Administered 2021-05-10: 2 via ORAL
  Filled 2021-05-09: qty 2

## 2021-05-09 MED ORDER — DIBUCAINE (PERIANAL) 1 % EX OINT: 1.0000 "application " | TOPICAL_OINTMENT | CUTANEOUS | Status: DC | PRN

## 2021-05-09 MED ORDER — LACTATED RINGERS IV SOLN
500.0000 mL | INTRAVENOUS | Status: DC | PRN
  Administered 2021-05-09: 1000 mL via INTRAVENOUS

## 2021-05-09 MED ORDER — MEASLES, MUMPS & RUBELLA VAC IJ SOLR: 0.5000 mL | Freq: Once | INTRAMUSCULAR | Status: DC

## 2021-05-09 MED ORDER — FENTANYL CITRATE (PF) 100 MCG/2ML IJ SOLN
INTRAMUSCULAR | Status: AC
  Filled 2021-05-09: qty 2

## 2021-05-09 MED ORDER — SIMETHICONE 80 MG PO CHEW: 80.0000 mg | CHEWABLE_TABLET | ORAL | Status: DC | PRN

## 2021-05-09 MED ORDER — ACETAMINOPHEN 325 MG PO TABS: 650.0000 mg | ORAL_TABLET | ORAL | Status: DC | PRN

## 2021-05-09 MED ORDER — ONDANSETRON HCL 4 MG/2ML IJ SOLN: 4.0000 mg | INTRAMUSCULAR | Status: DC | PRN

## 2021-05-09 MED ORDER — LACTATED RINGERS IV SOLN: 500.0000 mL | Freq: Once | INTRAVENOUS | Status: DC

## 2021-05-09 MED ORDER — OXYTOCIN-SODIUM CHLORIDE 30-0.9 UT/500ML-% IV SOLN
2.5000 [IU]/h | INTRAVENOUS | Status: DC
  Filled 2021-05-09: qty 500

## 2021-05-09 MED ORDER — COCONUT OIL OIL: 1.0000 "application " | TOPICAL_OIL | Status: DC | PRN

## 2021-05-09 MED ORDER — LIDOCAINE HCL (PF) 1 % IJ SOLN
INTRAMUSCULAR | Status: DC | PRN
Start: 2021-05-09 — End: 2021-05-09
  Administered 2021-05-09 (×2): 4 mL via EPIDURAL

## 2021-05-09 MED ORDER — ONDANSETRON HCL 4 MG PO TABS: 4.0000 mg | ORAL_TABLET | ORAL | Status: DC | PRN

## 2021-05-09 MED ORDER — OXYCODONE-ACETAMINOPHEN 5-325 MG PO TABS: 2.0000 | ORAL_TABLET | ORAL | Status: DC | PRN

## 2021-05-09 MED ORDER — ZOLPIDEM TARTRATE 5 MG PO TABS: 5.0000 mg | ORAL_TABLET | Freq: Every evening | ORAL | Status: DC | PRN

## 2021-05-09 MED ORDER — LACTATED RINGERS IV SOLN: INTRAVENOUS | Status: DC

## 2021-05-09 MED ORDER — SODIUM CHLORIDE 0.9% FLUSH: 3.0000 mL | INTRAVENOUS | Status: DC | PRN

## 2021-05-09 MED ORDER — OXYCODONE-ACETAMINOPHEN 5-325 MG PO TABS: 1.0000 | ORAL_TABLET | ORAL | Status: DC | PRN

## 2021-05-09 MED ORDER — SODIUM CHLORIDE 0.9% FLUSH
3.0000 mL | Freq: Two times a day (BID) | INTRAVENOUS | Status: DC
  Administered 2021-05-09: 3 mL via INTRAVENOUS

## 2021-05-09 MED ORDER — FENTANYL-BUPIVACAINE-NACL 0.5-0.125-0.9 MG/250ML-% EP SOLN
EPIDURAL | Status: AC
  Filled 2021-05-09: qty 250

## 2021-05-09 MED ORDER — OXYTOCIN BOLUS FROM INFUSION
333.0000 mL | Freq: Once | INTRAVENOUS | Status: AC
  Administered 2021-05-09: 333 mL via INTRAVENOUS

## 2021-05-09 MED ORDER — WITCH HAZEL-GLYCERIN EX PADS: 1.0000 "application " | MEDICATED_PAD | CUTANEOUS | Status: DC | PRN

## 2021-05-09 MED ORDER — FENTANYL CITRATE (PF) 100 MCG/2ML IJ SOLN
100.0000 ug | INTRAMUSCULAR | Status: DC | PRN
  Administered 2021-05-09: 100 ug via INTRAVENOUS

## 2021-05-09 MED ORDER — BENZOCAINE-MENTHOL 20-0.5 % EX AERO: 1.0000 "application " | INHALATION_SPRAY | CUTANEOUS | Status: DC | PRN

## 2021-05-09 MED ORDER — PRENATAL MULTIVITAMIN CH
1.0000 | ORAL_TABLET | Freq: Every day | ORAL | Status: DC
  Administered 2021-05-10: 1 via ORAL
  Filled 2021-05-09: qty 1

## 2021-05-09 MED ORDER — SOD CITRATE-CITRIC ACID 500-334 MG/5ML PO SOLN: 30.0000 mL | ORAL | Status: DC | PRN

## 2021-05-09 MED ORDER — ONDANSETRON HCL 4 MG/2ML IJ SOLN: 4.0000 mg | Freq: Four times a day (QID) | INTRAMUSCULAR | Status: DC | PRN

## 2021-05-09 MED ORDER — FENTANYL-BUPIVACAINE-NACL 0.5-0.125-0.9 MG/250ML-% EP SOLN
EPIDURAL | Status: DC | PRN
  Administered 2021-05-09: 12 mL/h via EPIDURAL

## 2021-05-09 NOTE — Discharge Summary (Signed)
Postpartum Discharge Summary      Patient Name: Nicole Schaefer DOB: Mar 16, 2001 MRN: [id number]  Date of admission: 05/09/2021 Delivery date:05/09/2021  Delivering provider: Patriciaann Clan  Date of discharge: 05/10/2021  Admitting diagnosis: Supervision of other normal pregnancy, antepartum [Z34.80] Intrauterine pregnancy: [redacted]w[redacted]d    Secondary diagnosis:  Principal Problem:   Supervision of other normal pregnancy, antepartum Active Problems:   Prediabetes   Exercise induced bronchospasm   History of gestational hypertension  Additional problems: None    Discharge diagnosis: Term Pregnancy Delivered                                              Post partum procedures: None Augmentation: Pitocin Complications: None  Hospital course: Onset of Labor With Vaginal Delivery      20y.o. yo G2P1001 at 322w2das admitted in Active Labor on 05/09/2021. Patient had an uncomplicated labor course as follows:  Membrane Rupture Time/Date: 4:00 PM ,05/09/2021   Delivery Method:Vaginal, Spontaneous  Episiotomy: None  Lacerations:  None  Patient had an uncomplicated postpartum course. Post partum her BP were normotensive except for one reading in the mild range in 136/80. She did not have any symptoms of hypertensive disorders of pregnancy. We scheduled her for a 1 week follow up for BP check.  She is ambulating, tolerating a regular diet, passing flatus, and urinating well. Patient is discharged home in stable condition on 05/10/21.  Newborn Data: Birth date:05/09/2021  Birth time:7:05 PM  Gender:Female  Living status:Living  Apgars:8 ,9  Weight:3090 g   Magnesium Sulfate received: No BMZ received: No Rhophylac:No MMR:No T-DaP:Given prenatally Flu: No Transfusion:No  Physical exam  Vitals:   05/10/21 0251 05/10/21 0631 05/10/21 0930 05/10/21 1300  BP: (!) 102/54 104/64 (!) 102/50 118/79  Pulse: 97 91 95 84  Resp: 16 17 18 18   Temp: 98.3 F (36.8 C) 98.4 F (36.9 C) 98.3 F  (36.8 C) 98 F (36.7 C)  TempSrc: Oral Oral Oral Oral  SpO2: 99% 100% 100% 99%  Weight:      Height:       General: alert Lochia: appropriate Uterine Fundus: firm Incision: N/A DVT Evaluation: No evidence of DVT seen on physical exam. Labs: Lab Results  Component Value Date   WBC 7.0 05/09/2021   HGB 11.9 (L) 05/09/2021   HCT 36.5 05/09/2021   MCV 83.5 05/09/2021   PLT 209 05/09/2021   CMP Latest Ref Rng & Units 04/30/2021  Glucose 70 - 99 mg/dL 100(H)  BUN 6 - 20 mg/dL 6  Creatinine 0.44 - 1.00 mg/dL 0.66  Sodium 135 - 145 mmol/L 137  Potassium 3.5 - 5.1 mmol/L 3.7  Chloride 98 - 111 mmol/L 107  CO2 22 - 32 mmol/L 19(L)  Calcium 8.9 - 10.3 mg/dL 8.9  Total Protein 6.5 - 8.1 g/dL 6.4(L)  Total Bilirubin 0.3 - 1.2 mg/dL 0.4  Alkaline Phos 38 - 126 U/L 151(H)  AST 15 - 41 U/L 20  ALT 0 - 44 U/L 12   Edinburgh Score: Edinburgh Postnatal Depression Scale Screening Tool 05/10/2021  I have been able to laugh and see the funny side of things. 0  I have looked forward with enjoyment to things. 0  I have blamed myself unnecessarily when things went wrong. 0  I have been anxious or worried for no good reason. 0  I have felt scared or panicky for no good reason. 0  Things have been getting on top of me. 0  I have been so unhappy that I have had difficulty sleeping. 0  I have felt sad or miserable. 0  I have been so unhappy that I have been crying. 0  The thought of harming myself has occurred to me. 0  Edinburgh Postnatal Depression Scale Total 0     After visit meds:  Allergies as of 05/10/2021       Reactions   Pollen Extract    Runny nose        Medication List     STOP taking these medications    Accu-Chek Guide test strip Generic drug: glucose blood   aspirin EC 81 MG tablet   butalbital-acetaminophen-caffeine 50-325-40 MG tablet Commonly known as: FIORICET   cyclobenzaprine 10 MG tablet Commonly known as: FLEXERIL   FeroSul 325 (65 FE) MG  tablet Generic drug: ferrous sulfate   Mometasone Furo-Formoterol Fum 50-5 MCG/ACT Aero   terconazole 0.4 % vaginal cream Commonly known as: TERAZOL 7   Vitamin D3 1.25 MG (50000 UT) Caps       TAKE these medications    Accu-Chek Guide w/Device Kit 1 Device by Does not apply route 4 (four) times daily.   Accu-Chek Softclix Lancets lancets Use as instructed   acetaminophen 325 MG tablet Commonly known as: Tylenol Take 2 tablets (650 mg total) by mouth every 4 (four) hours as needed (for pain scale < 4).   albuterol 108 (90 Base) MCG/ACT inhaler Commonly known as: VENTOLIN HFA Inhale into the lungs every 6 (six) hours as needed.   Blood Pressure Monitor Kit 1 Device by Does not apply route once a week. To be monitored Regularly at home.   Blood Pressure Kit Devi 1 Device by Does not apply route as needed.   fluticasone 44 MCG/ACT inhaler Commonly known as: FLOVENT HFA Inhale into the lungs 2 (two) times daily.   ibuprofen 600 MG tablet Commonly known as: ADVIL Take 1 tablet (600 mg total) by mouth every 6 (six) hours. Start taking on: May 11, 2021   loratadine 10 MG tablet Commonly known as: CLARITIN Take 10 mg by mouth daily.   norethindrone 0.35 MG tablet Commonly known as: Ortho Micronor Take 1 tablet (0.35 mg total) by mouth daily.   omeprazole 20 MG capsule Commonly known as: PRILOSEC Take 1 capsule (20 mg total) by mouth 2 (two) times daily before a meal.   Prenatal Vitamins 28-0.8 MG Tabs Take by mouth.         Discharge home in stable condition Infant Feeding: Bottle Infant Disposition:home with mother Discharge instruction: per After Visit Summary and Postpartum booklet. Activity: Advance as tolerated. Pelvic rest for 6 weeks.  Diet: routine diet Future Appointments:No future appointments. Follow up Visit:  Message sent to [facility] by Dr Higinio Plan on 11/23:  Please schedule this patient for a In person postpartum visit in 4 weeks with  the following provider: Any provider. Additional Postpartum F/U:2 hour GTT and 1 wk BP check High risk pregnancy complicated by: Pre-diabetes  Delivery mode:  Vaginal, Spontaneous  Anticipated Birth Control:  POPs  Renard Matter, MD, MPH OB Fellow, Faculty Practice

## 2021-05-09 NOTE — Progress Notes (Signed)
Labor Progress Note Nicole Schaefer is a 20 y.o. G2P1001 at [redacted]w[redacted]d presented for SOL.   S: Doing well, has a mild HA. Hasn't felt any new pressure or contractions with epidural in place.   O:  BP 122/70   Pulse 87   Temp 98.2 F (36.8 C) (Oral)   Resp 18   Ht 5\' 3"  (1.6 m)   Wt 104.5 kg   LMP 06/23/2020   SpO2 97%   BMI 40.80 kg/m  EFM: 130/mod/none/occasional early   CVE: Dilation: 9 Effacement (%): 90 Cervical Position: Posterior, Middle Station: 0 Presentation: Vertex Exam by:: Hicks Feick   A&P: 20 y.o. G2P1001 [redacted]w[redacted]d  #Labor: Progressing well with SROM clear around 1600 (unclear on time, patient was not aware she was ruptured). Will continue expectant management for now. Add pit if unchanged in the next few hours.  #Pain: Epidural in place  #FWB: Cat 1 #GBS negative   [redacted]w[redacted]d, DO 5:40 PM

## 2021-05-09 NOTE — MAU Note (Addendum)
Contractions started this morning, now every 3 min. No bleeding or leaking. Was 2 last time checked.

## 2021-05-09 NOTE — Progress Notes (Signed)
Vtx by BSUS 

## 2021-05-09 NOTE — Anesthesia Procedure Notes (Signed)
Epidural Patient location during procedure: OB Start time: 05/09/2021 9:25 AM End time: 05/09/2021 9:28 AM  Staffing Anesthesiologist: Kaylyn Layer, MD Performed: anesthesiologist   Preanesthetic Checklist Completed: patient identified, IV checked, risks and benefits discussed, monitors and equipment checked, pre-op evaluation and timeout performed  Epidural Patient position: sitting Prep: DuraPrep and site prepped and draped Patient monitoring: continuous pulse ox, blood pressure and heart rate Approach: midline Location: L3-L4 Injection technique: LOR air  Needle:  Needle type: Tuohy  Needle gauge: 17 G Needle length: 9 cm Needle insertion depth: 6 cm Catheter type: closed end flexible Catheter size: 19 Gauge Catheter at skin depth: 11 cm Test dose: negative and Other (1% lidocaine)  Assessment Events: blood not aspirated, injection not painful, no injection resistance, no paresthesia and negative IV test  Additional Notes Patient identified. Risks, benefits, and alternatives discussed with patient including but not limited to bleeding, infection, nerve damage, paralysis, failed block, incomplete pain control, headache, blood pressure changes, nausea, vomiting, reactions to medication, itching, and postpartum back pain. Confirmed with bedside nurse the patient's most recent platelet count. Confirmed with patient that they are not currently taking any anticoagulation, have any bleeding history, or any family history of bleeding disorders. Patient expressed understanding and wished to proceed. All questions were answered. Sterile technique was used throughout the entire procedure. Please see nursing notes for vital signs.   Crisp LOR on first pass. Test dose was given through epidural catheter and negative prior to continuing to dose epidural or start infusion. Warning signs of high block given to the patient including shortness of breath, tingling/numbness in hands, complete  motor block, or any concerning symptoms with instructions to call for help. Patient was given instructions on fall risk and not to get out of bed. All questions and concerns addressed with instructions to call with any issues or inadequate analgesia.  Reason for block:procedure for pain

## 2021-05-09 NOTE — Anesthesia Preprocedure Evaluation (Addendum)
Anesthesia Evaluation  Patient identified by MRN, date of birth, ID band Patient awake    Reviewed: Allergy & Precautions, Patient's Chart, lab work & pertinent test results  History of Anesthesia Complications Negative for: history of anesthetic complications  Airway Mallampati: II  TM Distance: >3 FB Neck ROM: Full    Dental no notable dental hx.    Pulmonary asthma ,    Pulmonary exam normal        Cardiovascular negative cardio ROS Normal cardiovascular exam     Neuro/Psych  Headaches, negative psych ROS   GI/Hepatic negative GI ROS, Neg liver ROS,   Endo/Other  Morbid obesity  Renal/GU negative Renal ROS  negative genitourinary   Musculoskeletal negative musculoskeletal ROS (+)   Abdominal   Peds  Hematology negative hematology ROS (+)   Anesthesia Other Findings Day of surgery medications reviewed with patient.  Reproductive/Obstetrics (+) Pregnancy                            Anesthesia Physical Anesthesia Plan  ASA: 3  Anesthesia Plan: Epidural   Post-op Pain Management:    Induction:   PONV Risk Score and Plan: Treatment may vary due to age or medical condition  Airway Management Planned: Natural Airway  Additional Equipment: Fetal Monitoring  Intra-op Plan:   Post-operative Plan:   Informed Consent: I have reviewed the patients History and Physical, chart, labs and discussed the procedure including the risks, benefits and alternatives for the proposed anesthesia with the patient or authorized representative who has indicated his/her understanding and acceptance.       Plan Discussed with:   Anesthesia Plan Comments:        Anesthesia Quick Evaluation

## 2021-05-09 NOTE — H&P (Signed)
OBSTETRIC ADMISSION HISTORY AND PHYSICAL  Nicole Schaefer is a 20 y.o. female G2P1001 with IUP at 67w2dby 7w [place] for SOL. She reports +FMs, No LOF, no VB, no blurry vision, headaches or peripheral edema, and RUQ pain.  She plans on bottle feeding. She request POPs for birth control. [facility]  Dating: By 7wk [place]--->  Estimated Date of Delivery: 05/28/21  Sono:    @[redacted]w[redacted]d , CWD, normal anatomy, cephalic presentation, anterior lie, 2685g, 53% EFW   Prenatal History/Complications:  Obesity (BMI 40) Prediabetes, ?Diet controlled GDM  Hx of PEC in prior pregnancy   Past Medical History: Past Medical History:  Diagnosis Date   Asthma    has been a while since inhaler/neb use    Headache    Varicose vein of leg     Past Surgical History: Past Surgical History:  Procedure Laterality Date   LEG SURGERY Left     Obstetrical History: OB History     Gravida  2   Para  1   Term  1   Preterm      AB      Living  1      SAB      IAB      Ectopic      Multiple  0   Live Births  1        Obstetric Comments  Pre-eclampsia with first         Social History Social History   Socioeconomic History   Marital status: Single    Spouse name: Not on file   Number of children: Not on file   Years of education: Not on file   Highest education level: Not on file  Occupational History   Not on file  Tobacco Use   Smoking status: Never   Smokeless tobacco: Never  Vaping Use   Vaping Use: Never used  Substance and Sexual Activity   Alcohol use: No   Drug use: No   Sexual activity: Not Currently    Partners: Male    Comment: [employer]   Other Topics Concern   Not on file  Social History Narrative   Not on file   Social Determinants of Health   Financial Resource Strain: Not on file  Food Insecurity: Not on file  Transportation Needs: Not on file  Physical Activity: Not on file  Stress: Not on file  Social  Connections: Not on file    Family History: Family History  Problem Relation Age of Onset   Healthy Mother    Hypertension Father    Hyperlipidemia Father    Sleep apnea Father    Diabetes Paternal Grandmother    Heart attack Paternal Grandmother     Allergies: Allergies  Allergen Reactions   Pollen Extract     Runny nose    Medications Prior to Admission  Medication Sig Dispense Refill Last Dose   aspirin EC 81 MG tablet Take 1 tablet (81 mg total) by mouth daily. Swallow whole. 30 tablet 7 05/09/2021   butalbital-acetaminophen-caffeine (FIORICET) 50-325-40 MG tablet Take 1-2 tablets by mouth every 6 (six) hours as needed for headache. 20 tablet 0 05/08/2021   Cholecalciferol (VITAMIN D3) 1.25 MG (50000 UT) CAPS Take 1 capsule by mouth once a week.   05/09/2021   cyclobenzaprine (FLEXERIL) 10 MG tablet Take 1 tablet (10 mg total) by mouth every 8 (eight) hours as needed for muscle spasms. 30 tablet 2 05/08/2021  FEROSUL 325 (65 Fe) MG tablet Take by mouth.   05/09/2021   loratadine (CLARITIN) 10 MG tablet Take 10 mg by mouth daily.   05/09/2021   omeprazole (PRILOSEC) 20 MG capsule Take 1 capsule (20 mg total) by mouth 2 (two) times daily before a meal. 60 capsule 2 05/09/2021   Prenatal Vit-Fe Fumarate-FA (PRENATAL VITAMINS) 28-0.8 MG TABS Take by mouth.   05/09/2021   Accu-Chek Softclix Lancets lancets Use as instructed 100 each 12    albuterol (VENTOLIN HFA) 108 (90 Base) MCG/ACT inhaler Inhale into the lungs every 6 (six) hours as needed. (Patient not taking: Reported on 04/25/2021)   More than a month   Blood Glucose Monitoring Suppl (ACCU-CHEK GUIDE) w/Device KIT 1 Device by Does not apply route 4 (four) times daily. (Patient not taking: Reported on 05/02/2021) 1 kit 0    Blood Pressure Monitor KIT 1 Device by Does not apply route once a week. To be monitored Regularly at home. (Patient not taking: Reported on 05/02/2021) 1 kit 0    Blood Pressure Monitoring (BLOOD PRESSURE  KIT) DEVI 1 Device by Does not apply route as needed. (Patient not taking: Reported on 05/02/2021) 1 each 0    cyclobenzaprine (FLEXERIL) 10 MG tablet Take 0.5-1 tablets (5-10 mg total) by mouth 3 (three) times daily as needed (headache). (Patient not taking: Reported on 05/02/2021) 30 tablet 0    fluticasone (FLOVENT HFA) 44 MCG/ACT inhaler Inhale into the lungs 2 (two) times daily. (Patient not taking: Reported on 05/02/2021)   More than a month   glucose blood (ACCU-CHEK GUIDE) test strip Use to check blood sugars four times a day was instructed (Patient not taking: Reported on 04/25/2021) 50 each 12    Mometasone Furo-Formoterol Fum 50-5 MCG/ACT AERO Inhale into the lungs. (Patient not taking: Reported on 03/28/2021)   More than a month   terconazole (TERAZOL 7) 0.4 % vaginal cream Place 1 applicator vaginally at bedtime. Use for seven days (Patient not taking: Reported on 05/02/2021) 45 g 0      Review of Systems   All systems reviewed and negative except as stated in HPI  Blood pressure 109/61, pulse 70, temperature 98.2 F (36.8 C), temperature source Oral, resp. rate 18, height 5' 3"  (1.6 m), weight 104.5 kg, last menstrual period 06/23/2020, SpO2 99 %, not currently breastfeeding. General appearance: alert, cooperative, and no distress Lungs: Normal WOB Heart: regular rate and rhythm Abdomen: soft, non-tender Pelvic: NEFG Extremities: Homans sign is negative, no sign of DVT Presentation: cephalic Fetal monitoring: Baseline 135 bpm, moderate variability, accels present, no decels Uterine activity: present, every 3-4 minutes Dilation: 6 Effacement (%): 100 Station: -2, -3 Exam by:: Jolynn   Prenatal labs: ABO, Rh: --/--/B POS (11/23 5462) Antibody: NEG (11/23 0810) Rubella: 2.98 (05/25 1527) RPR: Non Reactive (09/14 1013)  HBsAg: Negative (05/25 1527)  HIV: Non Reactive (09/14 1013)  GBS: Negative/-- (11/16 1454)  Early 2 hr Glucola normal Genetic screening  LR NIPS,  negative AFP, negative [facility] US normal  Prenatal Transfer Tool  Maternal Diabetes: No, prediabetes A1c 5.7, normal early GTT Genetic Screening: Normal, LR NIPS, negative AFP, negative [facility] Maternal Ultrasounds/Referrals: Normal Fetal Ultrasounds or other Referrals:  None Maternal Substance Abuse:  No Significant Maternal Medications:  None Significant Maternal Lab Results: Group B Strep negative  Results for orders placed or performed during the hospital encounter of 05/09/21 (from the past 24 hour(s))  Resp Panel by RT-PCR (Flu A&B, Covid) Nasopharyngeal Swab  Collection Time: 05/09/21  8:09 AM   Specimen: Nasopharyngeal Swab; Nasopharyngeal(NP) swabs in vial transport medium  Result Value Ref Range   SARS Coronavirus 2 by RT PCR NEGATIVE NEGATIVE   Influenza A by PCR NEGATIVE NEGATIVE   Influenza B by PCR NEGATIVE NEGATIVE  CBC   Collection Time: 05/09/21  8:09 AM  Result Value Ref Range   WBC 7.0 4.0 - 10.5 K/uL   RBC 4.37 3.87 - 5.11 MIL/uL   Hemoglobin 11.9 (L) 12.0 - 15.0 g/dL   HCT 36.5 36.0 - 46.0 %   MCV 83.5 80.0 - 100.0 fL   MCH 27.2 26.0 - 34.0 pg   MCHC 32.6 30.0 - 36.0 g/dL   RDW 15.5 11.5 - 15.5 %   Platelets 209 150 - 400 K/uL   nRBC 0.0 0.0 - 0.2 %  Type and screen [facility]   Collection Time: 05/09/21  8:10 AM  Result Value Ref Range   ABO/RH(D) B POS    Antibody Screen NEG    Sample Expiration      05/12/2021,2359 Performed at [facility], [place]. [address], [place], [place] [address]     Patient Active Problem List   Diagnosis Date Noted   Supervision of other normal pregnancy, antepartum 05/09/2021   [redacted] weeks gestation of pregnancy 05/02/2021   Pregnancy headache in third trimester 12/06/2020   Encounter for supervision of normal pregnancy in first trimester 10/24/2020   History of gestational hypertension 04/18/2020   Prediabetes 08/09/2019   Exercise induced bronchospasm 11/29/2015   Arteriovenous  malformation 10/05/2015   Perennial allergic rhinitis 08/30/2015    Assessment/Plan:  Nicole Schaefer is a 20 y.o. G2P1001 at 76w2dhere for SOL.  #Labor: Expectant management for now as she is contracting about every 2-3 minutes. Will discuss AROM in the future if not SROM here shortly.  #Pain: Epidural #FWB: Cat 1 #ID: GBS neg #MOF: bottle #MOC: POPs #Circ:  Yes  #Pre-diabetes  ?GDM: Early A1c 5.7% with normal 2 hr GTT at that time. Failed 27 wk 3 hr GTT (93 fasting, rest normal) but on repeat one month had a normal GTT. Not on any medications. 320w2dFW 2685 53%. Will check CBG, however if normal will postpone q4 checks.   #History of pre-eclampsia: BP has been normal since admit. Cont to monitor.   SaPatriciaann ClanDO  05/09/2021, 9:59 AM

## 2021-05-10 MED ORDER — NORETHINDRONE 0.35 MG PO TABS: 1.0000 | ORAL_TABLET | Freq: Every day | ORAL | 11 refills | Status: DC

## 2021-05-10 MED ORDER — ACETAMINOPHEN 325 MG PO TABS: 650.0000 mg | ORAL_TABLET | ORAL | 0 refills | Status: DC | PRN

## 2021-05-10 MED ORDER — IBUPROFEN 600 MG PO TABS
600.0000 mg | ORAL_TABLET | Freq: Four times a day (QID) | ORAL | 0 refills | Status: AC
Start: 2021-05-11 — End: ?

## 2021-05-10 NOTE — Anesthesia Postprocedure Evaluation (Signed)
Anesthesia Post Note  Patient: [name]  Procedure(s) Performed: AN AD HOC LABOR EPIDURAL     Patient location during evaluation: Mother Baby Anesthesia Type: Epidural Level of consciousness: awake Pain management: satisfactory to patient Vital Signs Assessment: post-procedure vital signs reviewed and stable Respiratory status: spontaneous breathing Cardiovascular status: stable Anesthetic complications: no   No notable events documented.  Last Vitals:  Vitals:   05/10/21 0251 05/10/21 0631  BP: (!) 102/54 104/64  Pulse: 97 91  Resp: 16 17  Temp: 36.8 C 36.9 C  SpO2: 99% 100%    Last Pain:  Vitals:   05/10/21 0631  TempSrc: Oral  PainSc: 0-No pain   Pain Goal:                   [employer]

## 2021-05-15 ENCOUNTER — Encounter: Payer: Self-pay | Admitting: Obstetrics and Gynecology

## 2021-05-15 ENCOUNTER — Ambulatory Visit (INDEPENDENT_AMBULATORY_CARE_PROVIDER_SITE_OTHER): Payer: Medicaid Other | Admitting: Licensed Clinical Social Worker

## 2021-05-15 DIAGNOSIS — F4322 Adjustment disorder with anxiety: Secondary | ICD-10-CM

## 2021-05-16 ENCOUNTER — Ambulatory Visit (INDEPENDENT_AMBULATORY_CARE_PROVIDER_SITE_OTHER): Payer: Medicaid Other

## 2021-05-16 ENCOUNTER — Other Ambulatory Visit: Payer: Self-pay

## 2021-05-16 DIAGNOSIS — R030 Elevated blood-pressure reading, without diagnosis of hypertension: Secondary | ICD-10-CM

## 2021-05-16 NOTE — Progress Notes (Signed)
Subjective:  Nicole Schaefer is a 20 y.o. female here for BP check.   Hypertension ROS: no TIA's, no chest pain on exertion, no dyspnea on exertion, and no swelling of ankles.    Objective:  BP 120/81   Pulse 99   Ht 5\' 4"  (1.626 m)   Wt 216 lb (98 kg)   LMP 06/23/2020   Breastfeeding No   BMI 37.08 kg/m   Appearance alert, well appearing, and in no distress. General exam BP noted to be well controlled today in office.    Assessment:   Blood Pressure well controlled.   Plan:  Current treatment plan is effective, no change in therapy. Pt to follow up for postpartum visit. PreEclampsia precautions given to patient.

## 2021-05-16 NOTE — BH Specialist Note (Signed)
Integrated Behavioral Health via Telemedicine Visit  05/16/2021 Nicole Schaefer [id number]  Number of Integrated Behavioral Health visits: 1 Session Start time: 2:00pm  Session End time: 2:22pm Total time: 22 mins via phone   Referring Provider: Tawana Schaefer  Patient/Family location: Home  [facility] Provider location: [facility]  All persons participating in visit: Pt Nicole Schaefer and LCSW Nicole Schaefer  Types of Service: Telephone visit  I connected with [name] and/or [name] n/a via  Telephone or Video Enabled Telemedicine Application  (Video is Caregility application) and verified that I am speaking with the correct person using two identifiers. Discussed confidentiality: Yes   I discussed the limitations of telemedicine and the availability of in person appointments.  Discussed there is a possibility of technology failure and discussed alternative modes of communication if that failure occurs.  I discussed that engaging in this telemedicine visit, they consent to the provision of behavioral healthcare and the services will be billed under their insurance.  Patient and/or legal guardian expressed understanding and consented to Telemedicine visit: Yes   Presenting Concerns: Patient and/or family reports the following symptoms/concerns: stress  Duration of problem: 6 days ; Severity of problem: mild  Patient and/or Family's Strengths/Protective Factors: Concrete supports in place (healthy food, safe environments, etc.)  Goals Addressed: Patient will:  Reduce symptoms of: stress   Increase knowledge and/or ability of: coping skills   Demonstrate ability to: Increase adequate support systems for patient/family  Progress towards Goals: Ongoing  Interventions: Interventions utilized:  Supportive Counseling Standardized Assessments completed: PHQ 9   Assessment: Patient currently experiencing adjustment disorder with anxious mood .   Patient may benefit from integrated  behavioral health.  Plan: Follow up with behavioral health clinician on : as needed  Behavioral recommendations: Create routine to prevent burnout, delegata task with supportive family member and father of baby, prioritize rest  Referral(s): Integrated [employer] (In Clinic)  I discussed the assessment and treatment plan with the patient and/or parent/guardian. They were provided an opportunity to ask questions and all were answered. They agreed with the plan and demonstrated an understanding of the instructions.   They were advised to call back or seek an in-person evaluation if the symptoms worsen or if the condition fails to improve as anticipated.  Nicole Saxon, LCSW

## 2021-05-23 ENCOUNTER — Telehealth (HOSPITAL_COMMUNITY): Payer: Self-pay | Admitting: *Deleted

## 2021-05-23 NOTE — Telephone Encounter (Signed)
Hospital Discharge Follow-Up Call:  Patient reports that she is well and has no concerns about her healing process.  EPDS today is 0 and she endorses this accurately reflects that she is doing well emotionally.  Patient says that baby is well and she has no concerns about baby's health.  She reports that baby sleeps in a bassinet.   Reviewed ABCs of Safe Sleep.

## 2021-05-29 DIAGNOSIS — Z111 Encounter for screening for respiratory tuberculosis: Secondary | ICD-10-CM | POA: Diagnosis not present

## 2021-06-13 ENCOUNTER — Encounter: Payer: Self-pay | Admitting: Obstetrics and Gynecology

## 2021-06-13 ENCOUNTER — Other Ambulatory Visit: Payer: Self-pay

## 2021-06-13 ENCOUNTER — Ambulatory Visit (INDEPENDENT_AMBULATORY_CARE_PROVIDER_SITE_OTHER): Payer: Medicaid Other | Admitting: Obstetrics and Gynecology

## 2021-06-13 MED ORDER — NORETHIN ACE-ETH ESTRAD-FE 1-20 MG-MCG PO TABS: 1.0000 | ORAL_TABLET | Freq: Every day | ORAL | 11 refills | Status: AC

## 2021-06-13 NOTE — Progress Notes (Signed)
Post Partum Visit Note  Nicole Schaefer is a 20 y.o. [id number] female who presents for a postpartum visit. She is 4 weeks postpartum following a normal spontaneous vaginal delivery.  I have fully reviewed the prenatal and intrapartum course. The delivery was at 37.2 gestational weeks.  Anesthesia: epidural. Postpartum course has been uncomplicated. Baby is doing well: yes. Baby is feeding by bottle - Gerber Goodstart Gentle . Bleeding no bleeding. Bowel function is normal. Bladder function is normal. Patient is not sexually active. Contraception method is none. Was told to stop BCPs. Wants to start back. Not breastfeeding. Postpartum depression screening: negative.   The pregnancy intention screening data noted above was reviewed. Potential methods of contraception were discussed. The patient elected to proceed with No data recorded.   Edinburgh Postnatal Depression Scale - 06/13/21 1120       Edinburgh Postnatal Depression Scale:  In the Past 7 Days   I have been able to laugh and see the funny side of things. 0    I have looked forward with enjoyment to things. 0    I have blamed myself unnecessarily when things went wrong. 0    I have been anxious or worried for no good reason. 0    I have felt scared or panicky for no good reason. 0    Things have been getting on top of me. 0    I have been so unhappy that I have had difficulty sleeping. 0    I have felt sad or miserable. 0    I have been so unhappy that I have been crying. 0    The thought of harming myself has occurred to me. 0    Edinburgh Postnatal Depression Scale Total 0             Health Maintenance Due  Topic Date Due   COVID-19 Vaccine (1) Never done   Pneumococcal Vaccine 68-50 Years old (1 - PCV) Never done   URINE MICROALBUMIN  Never done   HPV VACCINES (1 - 2-dose series) Never done   TETANUS/TDAP  Never done    The following portions of the patient's history were reviewed and updated as appropriate:  allergies, current medications, past family history, past medical history, past social history, past surgical history, and problem list.  Review of Systems Pertinent items are noted in HPI.  Objective:  BP 117/76    Pulse 98    Ht 5\' 3"  (1.6 m)    Wt 211 lb 14.4 oz (96.1 kg)    BMI 37.54 kg/m    General:  alert, cooperative, and no distress   Breasts:  not indicated  Lungs: clear to auscultation bilaterally  Heart:  regular rate and rhythm, S1, S2 normal, no murmur, click, rub or gallop  Abdomen: soft, non-tender; bowel sounds normal; no masses,  no organomegaly   Wound N/a  GU exam:  not indicated       Assessment:    There are no diagnoses linked to this encounter.  6wk postpartum exam.   Plan:   Essential components of care per ACOG recommendations:  1.  Mood and well being: Patient with negative depression screening today. Reviewed local resources for support.  - Patient tobacco use? No.   - hx of drug use? No.    2. Infant care and feeding:  -Patient currently breastmilk feeding? No.  -Social determinants of health (SDOH) reviewed in EPIC. No concerns  3. Sexuality, contraception and birth spacing - Patient  does not want a pregnancy in the next year.  Desired family size is unknown - Reviewed forms of contraception in tiered fashion. Patient desired oral contraceptives (estrogen/progesterone) today.   - Discussed birth spacing of 18 months  4. Sleep and fatigue -Encouraged family/partner/community support of 4 hrs of uninterrupted sleep to help with mood and fatigue  5. Physical Recovery  - Discussed patients delivery and complications. She describes her labor as good. - Patient had a Vaginal, no problems at delivery. Patient had  no  laceration. Perineal healing reviewed. Patient expressed understanding - Patient has urinary incontinence? No. - Patient is safe to resume physical and sexual activity  6.  Health Maintenance - HM due items addressed Yes - Last  pap smear No results found for: DIAGPAP Pap smear not yet due until 21 -Breast Cancer screening indicated? No.   7. Chronic Disease/Pregnancy Condition follow up: Gestational Diabetes  - PCP follow up  [name], MD Center for [facility], [facility]

## 2021-07-05 ENCOUNTER — Telehealth (INDEPENDENT_AMBULATORY_CARE_PROVIDER_SITE_OTHER): Payer: Medicaid Other | Admitting: Obstetrics

## 2021-07-05 ENCOUNTER — Encounter: Payer: Self-pay | Admitting: Obstetrics

## 2021-07-05 DIAGNOSIS — J069 Acute upper respiratory infection, unspecified: Secondary | ICD-10-CM | POA: Diagnosis not present

## 2021-07-05 DIAGNOSIS — R519 Headache, unspecified: Secondary | ICD-10-CM | POA: Diagnosis not present

## 2021-07-05 MED ORDER — BUTALBITAL-APAP-CAFFEINE 50-325-40 MG PO TABS: 1.0000 | ORAL_TABLET | Freq: Four times a day (QID) | ORAL | 2 refills | Status: AC | PRN

## 2021-07-05 NOTE — Progress Notes (Signed)
° °  TELEHEALTH GYNECOLOGY VISIT ENCOUNTER NOTE  Provider location: Center for [facility] at [facility]   Patient location: Home  I connected with [facility] on 07/05/21 at  2:50 PM EST by telephone and verified that I am speaking with the correct person using two identifiers. Patient was unable to do MyChart audiovisual encounter due to technical difficulties, she tried several times.    I discussed the limitations, risks, security and privacy concerns of performing an evaluation and management service by telephone and the availability of in person appointments. I also discussed with the patient that there may be a patient responsible charge related to this service. The patient expressed understanding and agreed to proceed.   History:  Nicole Schaefer is a 21 y.o. [phone] female being evaluated today for frequent headaches since November 2022, after delivery of baby. She denies any abnormal vaginal discharge, bleeding, pelvic pain or other concerns.       Past Medical History:  Diagnosis Date   Asthma    has been a while since inhaler/neb use    Headache    Varicose vein of leg    Past Surgical History:  Procedure Laterality Date   LEG SURGERY Left    The following portions of the patient's history were reviewed and updated as appropriate: allergies, current medications, past family history, past medical history, past social history, past surgical history and problem list.   Health Maintenance:  Routine, no problems  Review of Systems:  Pertinent items noted in HPI and remainder of comprehensive ROS otherwise negative.  Physical Exam:   General:  Alert, oriented and cooperative.   Mental Status: Normal mood and affect perceived. Normal judgment and thought content.  Physical exam deferred due to nature of the encounter  Labs and Imaging No results found for this or any previous visit (from the past 336 hour(s)). No results found.    Assessment and Plan:     1.  Frequent headaches Rx: - Ambulatory referral to Neurology - butalbital-acetaminophen-caffeine (FIORICET) 50-325-40 MG tablet; Take 1-2 tablets by mouth every 6 (six) hours as needed for headache.  Dispense: 30 tablet; Refill: 2       I discussed the assessment and treatment plan with the patient. The patient was provided an opportunity to ask questions and all were answered. The patient agreed with the plan and demonstrated an understanding of the instructions.   The patient was advised to call back or seek an in-person evaluation/go to the ED if the symptoms worsen or if the condition fails to improve as anticipated.  I have spent a total of 15 minutes non-face-to-face time, excluding clinical staff time, reviewing notes and preparing to see patient, ordering tests and/or medications, and counseling the patient.    Coral Ceo, MD Center for [facility], [facility]  07/05/21

## 2021-07-05 NOTE — Progress Notes (Signed)
Virtual Visit via Telephone Note  I connected with [name] on 07/05/21 at  2:50 PM EST by telephone and verified that I am speaking with the correct person using two identifiers.  Pt c/o HA's since pregnancy. Pt not at home and does not have BP cuff with her at this time. No relief with IB nor Tylenol. Denies visual disturbances and lightheadedness.

## 2021-07-12 DIAGNOSIS — N750 Cyst of Bartholin's gland: Secondary | ICD-10-CM | POA: Diagnosis not present

## 2021-07-27 ENCOUNTER — Encounter: Payer: Self-pay | Admitting: Obstetrics and Gynecology

## 2021-08-07 ENCOUNTER — Ambulatory Visit: Payer: Medicaid Other | Admitting: Psychiatry

## 2021-08-07 ENCOUNTER — Encounter: Payer: Self-pay | Admitting: Psychiatry

## 2021-08-09 DIAGNOSIS — E669 Obesity, unspecified: Secondary | ICD-10-CM | POA: Diagnosis not present

## 2021-08-09 DIAGNOSIS — G8929 Other chronic pain: Secondary | ICD-10-CM | POA: Diagnosis not present

## 2021-08-09 DIAGNOSIS — M542 Cervicalgia: Secondary | ICD-10-CM | POA: Diagnosis not present

## 2021-08-09 DIAGNOSIS — N62 Hypertrophy of breast: Secondary | ICD-10-CM | POA: Diagnosis not present

## 2021-08-09 DIAGNOSIS — M549 Dorsalgia, unspecified: Secondary | ICD-10-CM | POA: Diagnosis not present

## 2021-08-23 NOTE — H&P (Signed)
Subjective:  ?  ? Patient ID: Nicole Schaefer is a 21 y.o. female. ?  ?HPI ?  ?Here for follow up discussion prior to planned breast reduction. Surgery previously scheduled in 2022 and canceled due to pregnancy. She delivered 05/09/2021. No breast feeding. Denies nipple discharge. ?  ?Current 42 DD- reports feels larger volume post pregnancy but wearing same bras. Reports over 4 year duration neck and back pain. Has tried specialty fitted bras, Tylenol, regular stretches/exercise, hot/cold packs for over year trial without relief. Notes pain limits her ability to work which requires lifting prolonged standing bending/stooping.  Denies rashes. Denies numbness hands.  ?  ?Wt stable. States highest weight with most recent pregnancy 220-230 lb. Denies any nipple/breast discharge. ?  ?No prior MMG. Denies and FH breast or ovarian ca. ?  ?Seen by Dr. Wynelle Link last 07/2020 for follow up LE venous malformation. ?  ?Lives with two infant children and boyfriend. Working at [employer]. States parents would assist with post op child care. ?  ?Review of Systems  ?Musculoskeletal: Positive for back pain and neck pain.  ?Remainder 12 point review negative ?   ?Objective:  ? Physical Exam  ?Cardiovascular: Normal rate, regular rhythm and normal heart sounds.  ?Pulmonary/Chest: Effort normal and breath sounds normal.  ?Neurological: She is alert.  ?Skin:  ?Fitzpatrick 6  ?  ?Grade 3 ptosis bilateral ?Left>>right volume ?No masses ?+shoulder grooving ?SN to nipple R 37 L 39 cm ?BW R 24 L 25 cm ?Nipple to IMF R 16 L 18cm ?   ?Assessment:  ?   ?Macromastia ?Chronic neck and back pain ?Obesity ?   ?Plan:  ?   ?Chronic neck and back pain in setting of macromastia with functional impairment that has failed conservative measures. The pain is not associated with another diagnosis. There are no other causes for back and neck pain on exam or by history. Breast reduction has expectation to result in significant improvement of impairment. ?   ?Reviewed reduction with anchor type scars, possible drains, post operative visits and limitations, recovery. Diminished sensation nipple and breast skin, risk of nipple loss, wound healing problems, asymmetry, incidental carcinoma, changes with wt gain/loss, aging, pregnancy unacceptable cosmetic appearance reviewed. Discussed effect on breast feeding. Counseled cannot assure her cup size. ?  ?Additional risks including but not limited to bleeding seroma hematoma infection need for additional procedures blood clots in legs or lungs reviewed.  ?  ?Drain teaching completed. Rx for tramadol given. ?  ?Anticipate 778 g reduction from each breast.  ?  ? ?

## 2021-08-31 ENCOUNTER — Other Ambulatory Visit: Payer: Self-pay

## 2021-08-31 ENCOUNTER — Encounter (HOSPITAL_BASED_OUTPATIENT_CLINIC_OR_DEPARTMENT_OTHER): Payer: Self-pay | Admitting: Plastic Surgery

## 2021-09-04 NOTE — Progress Notes (Signed)

## 2021-09-10 NOTE — Anesthesia Preprocedure Evaluation (Addendum)
Anesthesia Evaluation  ?Patient identified by MRN, date of birth, ID band ?Patient awake ? ? ? ?Reviewed: ?Allergy & Precautions, NPO status , Patient's Chart, lab work & pertinent test results ? ?History of Anesthesia Complications ?Negative for: history of anesthetic complications ? ?Airway ?Mallampati: II ? ?TM Distance: >3 FB ?Neck ROM: Full ? ? ? Dental ? ?(+) Teeth Intact, Dental Advisory Given ?  ?Pulmonary ?asthma ,  ?  ?breath sounds clear to auscultation ? ? ? ? ? ? Cardiovascular ?negative cardio ROS ? ? ?Rhythm:Regular Rate:Normal ? ? ?  ?Neuro/Psych ? Headaches,   ? GI/Hepatic ?negative GI ROS, Neg liver ROS,   ?Endo/Other  ?Morbid obesity ? Renal/GU ?negative Renal ROS  ? ?  ?Musculoskeletal ? ? Abdominal ?(+) + obese,   ?Peds ? Hematology ?negative hematology ROS ?(+)   ?Anesthesia Other Findings ? ? Reproductive/Obstetrics ? ?  ? ? ? ? ? ? ? ? ? ? ? ? ? ?  ?  ? ? ? ? ? ? ? ?Anesthesia Physical ?Anesthesia Plan ? ?ASA: 2 ? ?Anesthesia Plan: General  ? ?Post-op Pain Management: Tylenol PO (pre-op)* and Toradol IV (intra-op)*  ? ?Induction: Intravenous ? ?PONV Risk Score and Plan: 3 and Ondansetron, Dexamethasone and Scopolamine patch - Pre-op ? ?Airway Management Planned: Oral ETT ? ?Additional Equipment: None ? ?Intra-op Plan:  ? ?Post-operative Plan: Extubation in OR ? ?Informed Consent: I have reviewed the patients History and Physical, chart, labs and discussed the procedure including the risks, benefits and alternatives for the proposed anesthesia with the patient or authorized representative who has indicated his/her understanding and acceptance.  ? ? ? ?Dental advisory given ? ?Plan Discussed with: CRNA and Surgeon ? ?Anesthesia Plan Comments:   ? ? ? ? ? ?Anesthesia Quick Evaluation ? ?

## 2021-09-11 ENCOUNTER — Ambulatory Visit (HOSPITAL_BASED_OUTPATIENT_CLINIC_OR_DEPARTMENT_OTHER): Payer: Medicaid Other | Admitting: Anesthesiology

## 2021-09-11 ENCOUNTER — Ambulatory Visit (HOSPITAL_BASED_OUTPATIENT_CLINIC_OR_DEPARTMENT_OTHER)
Admission: RE | Admit: 2021-09-11 | Discharge: 2021-09-11 | Disposition: A | Discharge status: Home / Self Care | Payer: Medicaid Other | Attending: Plastic Surgery | Admitting: Plastic Surgery

## 2021-09-11 ENCOUNTER — Encounter (HOSPITAL_BASED_OUTPATIENT_CLINIC_OR_DEPARTMENT_OTHER)
Admission: RE | Disposition: A | Discharge status: Home / Self Care | Payer: Self-pay | Source: Home / Self Care | Attending: Plastic Surgery

## 2021-09-11 ENCOUNTER — Other Ambulatory Visit: Payer: Self-pay

## 2021-09-11 ENCOUNTER — Encounter (HOSPITAL_BASED_OUTPATIENT_CLINIC_OR_DEPARTMENT_OTHER): Payer: Self-pay | Admitting: Plastic Surgery

## 2021-09-11 DIAGNOSIS — N6031 Fibrosclerosis of right breast: Secondary | ICD-10-CM | POA: Insufficient documentation

## 2021-09-11 DIAGNOSIS — N62 Hypertrophy of breast: Secondary | ICD-10-CM | POA: Insufficient documentation

## 2021-09-11 DIAGNOSIS — N6032 Fibrosclerosis of left breast: Secondary | ICD-10-CM | POA: Diagnosis not present

## 2021-09-11 DIAGNOSIS — G8929 Other chronic pain: Secondary | ICD-10-CM | POA: Diagnosis not present

## 2021-09-11 DIAGNOSIS — Z6841 Body Mass Index (BMI) 40.0 and over, adult: Secondary | ICD-10-CM | POA: Diagnosis not present

## 2021-09-11 HISTORY — PX: BREAST REDUCTION SURGERY: SHX8

## 2021-09-11 LAB — POCT PREGNANCY, URINE: Preg Test, Ur: NEGATIVE

## 2021-09-11 SURGERY — MAMMOPLASTY, REDUCTION
Anesthesia: General | Site: Breast | Laterality: Bilateral

## 2021-09-11 MED ORDER — CEFAZOLIN SODIUM-DEXTROSE 2-4 GM/100ML-% IV SOLN
2.0000 g | INTRAVENOUS | Status: AC
  Administered 2021-09-11: 2 g via INTRAVENOUS

## 2021-09-11 MED ORDER — ONDANSETRON HCL 4 MG/2ML IJ SOLN
INTRAMUSCULAR | Status: AC
  Filled 2021-09-11: qty 2

## 2021-09-11 MED ORDER — CHLORHEXIDINE GLUCONATE CLOTH 2 % EX PADS: 6.0000 | MEDICATED_PAD | Freq: Once | CUTANEOUS | Status: DC

## 2021-09-11 MED ORDER — SUGAMMADEX SODIUM 500 MG/5ML IV SOLN
INTRAVENOUS | Status: DC | PRN
  Administered 2021-09-11: 250 mg via INTRAVENOUS

## 2021-09-11 MED ORDER — FENTANYL CITRATE (PF) 100 MCG/2ML IJ SOLN
INTRAMUSCULAR | Status: DC | PRN
  Administered 2021-09-11 (×3): 50 ug via INTRAVENOUS
  Administered 2021-09-11: 150 ug via INTRAVENOUS

## 2021-09-11 MED ORDER — CELECOXIB 200 MG PO CAPS
200.0000 mg | ORAL_CAPSULE | ORAL | Status: AC
  Administered 2021-09-11: 200 mg via ORAL

## 2021-09-11 MED ORDER — CEFAZOLIN SODIUM-DEXTROSE 2-4 GM/100ML-% IV SOLN
INTRAVENOUS | Status: AC
  Filled 2021-09-11: qty 100

## 2021-09-11 MED ORDER — PHENYLEPHRINE HCL (PRESSORS) 10 MG/ML IV SOLN
INTRAVENOUS | Status: DC | PRN
  Administered 2021-09-11 (×2): 80 ug via INTRAVENOUS
  Administered 2021-09-11: 40 ug via INTRAVENOUS

## 2021-09-11 MED ORDER — PROPOFOL 10 MG/ML IV BOLUS
INTRAVENOUS | Status: DC | PRN
Start: 2021-09-11 — End: 2021-09-11
  Administered 2021-09-11: 200 mg via INTRAVENOUS

## 2021-09-11 MED ORDER — LIDOCAINE HCL (PF) 1 % IJ SOLN
INTRAMUSCULAR | Status: AC
  Filled 2021-09-11: qty 60

## 2021-09-11 MED ORDER — ROCURONIUM BROMIDE 100 MG/10ML IV SOLN
INTRAVENOUS | Status: DC | PRN
  Administered 2021-09-11: 10 mg via INTRAVENOUS
  Administered 2021-09-11: 70 mg via INTRAVENOUS
  Administered 2021-09-11: 20 mg via INTRAVENOUS

## 2021-09-11 MED ORDER — DEXAMETHASONE SODIUM PHOSPHATE 10 MG/ML IJ SOLN
INTRAMUSCULAR | Status: AC
  Filled 2021-09-11: qty 1

## 2021-09-11 MED ORDER — GABAPENTIN 300 MG PO CAPS
300.0000 mg | ORAL_CAPSULE | ORAL | Status: AC
  Administered 2021-09-11: 300 mg via ORAL

## 2021-09-11 MED ORDER — EPHEDRINE 5 MG/ML INJ
INTRAVENOUS | Status: AC
  Filled 2021-09-11: qty 5

## 2021-09-11 MED ORDER — HYDROMORPHONE HCL 1 MG/ML IJ SOLN
INTRAMUSCULAR | Status: AC
  Filled 2021-09-11: qty 0.5

## 2021-09-11 MED ORDER — PHENYLEPHRINE 40 MCG/ML (10ML) SYRINGE FOR IV PUSH (FOR BLOOD PRESSURE SUPPORT)
PREFILLED_SYRINGE | INTRAVENOUS | Status: AC
  Filled 2021-09-11: qty 10

## 2021-09-11 MED ORDER — BUPIVACAINE HCL (PF) 0.25 % IJ SOLN
INTRAMUSCULAR | Status: AC
  Filled 2021-09-11: qty 30

## 2021-09-11 MED ORDER — SCOPOLAMINE 1 MG/3DAYS TD PT72
MEDICATED_PATCH | TRANSDERMAL | Status: AC
  Filled 2021-09-11: qty 1

## 2021-09-11 MED ORDER — ACETAMINOPHEN 500 MG PO TABS
ORAL_TABLET | ORAL | Status: AC
Start: 2021-09-11 — End: ?
  Filled 2021-09-11: qty 2

## 2021-09-11 MED ORDER — ATROPINE SULFATE 0.4 MG/ML IV SOLN
INTRAVENOUS | Status: AC
  Filled 2021-09-11: qty 1

## 2021-09-11 MED ORDER — CELECOXIB 200 MG PO CAPS
ORAL_CAPSULE | ORAL | Status: AC
Start: 2021-09-11 — End: ?
  Filled 2021-09-11: qty 1

## 2021-09-11 MED ORDER — HYDROMORPHONE HCL 1 MG/ML IJ SOLN
0.2500 mg | INTRAMUSCULAR | Status: DC | PRN
  Administered 2021-09-11 (×2): 0.5 mg via INTRAVENOUS

## 2021-09-11 MED ORDER — MIDAZOLAM HCL 2 MG/2ML IJ SOLN
INTRAMUSCULAR | Status: AC
  Filled 2021-09-11: qty 2

## 2021-09-11 MED ORDER — LIDOCAINE 2% (20 MG/ML) 5 ML SYRINGE
INTRAMUSCULAR | Status: AC
  Filled 2021-09-11: qty 5

## 2021-09-11 MED ORDER — LIDOCAINE HCL (CARDIAC) PF 100 MG/5ML IV SOSY
PREFILLED_SYRINGE | INTRAVENOUS | Status: DC | PRN
  Administered 2021-09-11: 60 mg via INTRAVENOUS

## 2021-09-11 MED ORDER — FENTANYL CITRATE (PF) 100 MCG/2ML IJ SOLN
INTRAMUSCULAR | Status: AC
  Filled 2021-09-11: qty 2

## 2021-09-11 MED ORDER — ALBUMIN HUMAN 5 % IV SOLN
INTRAVENOUS | Status: AC
  Filled 2021-09-11: qty 250

## 2021-09-11 MED ORDER — BUPIVACAINE HCL (PF) 0.5 % IJ SOLN
INTRAMUSCULAR | Status: AC
  Filled 2021-09-11: qty 30

## 2021-09-11 MED ORDER — ONDANSETRON HCL 4 MG/2ML IJ SOLN
INTRAMUSCULAR | Status: DC | PRN
  Administered 2021-09-11: 4 mg via INTRAVENOUS

## 2021-09-11 MED ORDER — PROMETHAZINE HCL 25 MG/ML IJ SOLN: 6.2500 mg | INTRAMUSCULAR | Status: DC | PRN

## 2021-09-11 MED ORDER — ROCURONIUM BROMIDE 10 MG/ML (PF) SYRINGE
PREFILLED_SYRINGE | INTRAVENOUS | Status: AC
  Filled 2021-09-11: qty 10

## 2021-09-11 MED ORDER — OXYCODONE HCL 5 MG/5ML PO SOLN: 5.0000 mg | Freq: Once | ORAL | Status: DC | PRN

## 2021-09-11 MED ORDER — ACETAMINOPHEN 500 MG PO TABS: 1000.0000 mg | ORAL_TABLET | ORAL | Status: DC

## 2021-09-11 MED ORDER — MIDAZOLAM HCL 2 MG/2ML IJ SOLN: 0.5000 mg | Freq: Once | INTRAMUSCULAR | Status: DC | PRN

## 2021-09-11 MED ORDER — SUCCINYLCHOLINE CHLORIDE 200 MG/10ML IV SOSY
PREFILLED_SYRINGE | INTRAVENOUS | Status: AC
  Filled 2021-09-11: qty 10

## 2021-09-11 MED ORDER — SODIUM BICARBONATE 4.2 % IV SOLN
INTRAVENOUS | Status: AC
  Filled 2021-09-11: qty 20

## 2021-09-11 MED ORDER — GABAPENTIN 300 MG PO CAPS
ORAL_CAPSULE | ORAL | Status: AC
  Filled 2021-09-11: qty 1

## 2021-09-11 MED ORDER — MIDAZOLAM HCL 5 MG/5ML IJ SOLN
INTRAMUSCULAR | Status: DC | PRN
Start: 2021-09-11 — End: 2021-09-11
  Administered 2021-09-11: 2 mg via INTRAVENOUS

## 2021-09-11 MED ORDER — ACETAMINOPHEN 500 MG PO TABS
1000.0000 mg | ORAL_TABLET | Freq: Once | ORAL | Status: AC
  Administered 2021-09-11: 1000 mg via ORAL

## 2021-09-11 MED ORDER — MEPERIDINE HCL 25 MG/ML IJ SOLN: 6.2500 mg | INTRAMUSCULAR | Status: DC | PRN

## 2021-09-11 MED ORDER — EPINEPHRINE PF 1 MG/ML IJ SOLN
INTRAMUSCULAR | Status: AC
  Filled 2021-09-11: qty 1

## 2021-09-11 MED ORDER — BUPIVACAINE HCL (PF) 0.5 % IJ SOLN
INTRAMUSCULAR | Status: DC | PRN
  Administered 2021-09-11: 30 mL

## 2021-09-11 MED ORDER — OXYCODONE HCL 5 MG PO TABS: 5.0000 mg | ORAL_TABLET | Freq: Once | ORAL | Status: DC | PRN

## 2021-09-11 MED ORDER — SCOPOLAMINE 1 MG/3DAYS TD PT72
1.0000 | MEDICATED_PATCH | TRANSDERMAL | Status: DC
  Administered 2021-09-11: 1.5 mg via TRANSDERMAL

## 2021-09-11 MED ORDER — BUPIVACAINE-EPINEPHRINE (PF) 0.25% -1:200000 IJ SOLN
INTRAMUSCULAR | Status: AC
  Filled 2021-09-11: qty 30

## 2021-09-11 MED ORDER — DEXAMETHASONE SODIUM PHOSPHATE 4 MG/ML IJ SOLN
INTRAMUSCULAR | Status: DC | PRN
  Administered 2021-09-11: 10 mg via INTRAVENOUS

## 2021-09-11 MED ORDER — LACTATED RINGERS IV SOLN: INTRAVENOUS | Status: DC

## 2021-09-11 MED ORDER — 0.9 % SODIUM CHLORIDE (POUR BTL) OPTIME
TOPICAL | Status: DC | PRN
  Administered 2021-09-11: 1000 mL

## 2021-09-11 SURGICAL SUPPLY — 52 items
ADH SKN CLS APL DERMABOND .7 (GAUZE/BANDAGES/DRESSINGS) ×4
APL PRP STRL LF DISP 70% ISPRP (MISCELLANEOUS) ×2
BINDER BREAST 3XL (GAUZE/BANDAGES/DRESSINGS) IMPLANT
BINDER BREAST LRG (GAUZE/BANDAGES/DRESSINGS) IMPLANT
BINDER BREAST MEDIUM (GAUZE/BANDAGES/DRESSINGS) IMPLANT
BINDER BREAST XLRG (GAUZE/BANDAGES/DRESSINGS) ×1 IMPLANT
BINDER BREAST XXLRG (GAUZE/BANDAGES/DRESSINGS) IMPLANT
BLADE SURG 10 STRL SS (BLADE) ×7 IMPLANT
BNDG GAUZE ELAST 4 BULKY (GAUZE/BANDAGES/DRESSINGS) ×4 IMPLANT
CANISTER SUCT 1200ML W/VALVE (MISCELLANEOUS) ×2 IMPLANT
CHLORAPREP W/TINT 26 (MISCELLANEOUS) ×4 IMPLANT
COVER BACK TABLE 60X90IN (DRAPES) ×2 IMPLANT
COVER MAYO STAND STRL (DRAPES) ×2 IMPLANT
DERMABOND ADVANCED (GAUZE/BANDAGES/DRESSINGS) ×4
DERMABOND ADVANCED .7 DNX12 (GAUZE/BANDAGES/DRESSINGS) ×1 IMPLANT
DRAIN CHANNEL 15F RND FF W/TCR (WOUND CARE) ×2 IMPLANT
DRAPE TOP ARMCOVERS (MISCELLANEOUS) ×2 IMPLANT
DRAPE U-SHAPE 76X120 STRL (DRAPES) ×2 IMPLANT
DRAPE UTILITY XL STRL (DRAPES) ×3 IMPLANT
DRSG PAD ABDOMINAL 8X10 ST (GAUZE/BANDAGES/DRESSINGS) ×4 IMPLANT
ELECT COATED BLADE 2.86 ST (ELECTRODE) ×2 IMPLANT
ELECT REM PT RETURN 9FT ADLT (ELECTROSURGICAL) ×2
ELECTRODE REM PT RTRN 9FT ADLT (ELECTROSURGICAL) ×1 IMPLANT
EVACUATOR SILICONE 100CC (DRAIN) ×2 IMPLANT
GLOVE SURG HYDRASOFT LTX SZ5.5 (GLOVE) ×2 IMPLANT
GLOVE SURG UNDER POLY LF SZ6.5 (GLOVE) ×1 IMPLANT
GLOVE SURG UNDER POLY LF SZ7 (GLOVE) ×3 IMPLANT
GOWN STRL REUS W/ TWL LRG LVL3 (GOWN DISPOSABLE) ×2 IMPLANT
GOWN STRL REUS W/TWL LRG LVL3 (GOWN DISPOSABLE) ×4
MARKER SKIN DUAL TIP RULER LAB (MISCELLANEOUS) IMPLANT
NDL HYPO 25X1 1.5 SAFETY (NEEDLE) IMPLANT
NEEDLE HYPO 25X1 1.5 SAFETY (NEEDLE) ×2 IMPLANT
NS IRRIG 1000ML POUR BTL (IV SOLUTION) ×2 IMPLANT
PACK BASIN DAY SURGERY FS (CUSTOM PROCEDURE TRAY) ×2 IMPLANT
PENCIL SMOKE EVACUATOR (MISCELLANEOUS) ×2 IMPLANT
PIN SAFETY STERILE (MISCELLANEOUS) ×2 IMPLANT
SHEET MEDIUM DRAPE 40X70 STRL (DRAPES) ×4 IMPLANT
SLEEVE SCD COMPRESS KNEE MED (STOCKING) ×2 IMPLANT
SPONGE T-LAP 18X18 ~~LOC~~+RFID (SPONGE) ×7 IMPLANT
STAPLER VISISTAT 35W (STAPLE) ×3 IMPLANT
SUT ETHILON 2 0 FS 18 (SUTURE) ×2 IMPLANT
SUT MNCRL AB 4-0 PS2 18 (SUTURE) ×7 IMPLANT
SUT VIC AB 3-0 PS1 18 (SUTURE) ×16
SUT VIC AB 3-0 PS1 18XBRD (SUTURE) ×4 IMPLANT
SUT VICRYL 4-0 PS2 18IN ABS (SUTURE) ×4 IMPLANT
SYR BULB IRRIG 60ML STRL (SYRINGE) ×2 IMPLANT
SYR CONTROL 10ML LL (SYRINGE) ×1 IMPLANT
TAPE MEASURE VINYL STERILE (MISCELLANEOUS) IMPLANT
TOWEL GREEN STERILE FF (TOWEL DISPOSABLE) ×4 IMPLANT
TUBE CONNECTING 20X1/4 (TUBING) ×2 IMPLANT
UNDERPAD 30X36 HEAVY ABSORB (UNDERPADS AND DIAPERS) ×4 IMPLANT
YANKAUER SUCT BULB TIP NO VENT (SUCTIONS) ×2 IMPLANT

## 2021-09-11 NOTE — Anesthesia Postprocedure Evaluation (Signed)
Anesthesia Post Note ? ?Patient: Dabney Dolliver ? ?Procedure(s) Performed: MAMMARY REDUCTION  (BREAST) (Bilateral: Breast) ? ?  ? ?Patient location during evaluation: Phase II ?Anesthesia Type: General ?Level of consciousness: oriented, patient cooperative and awake ?Pain management: pain level controlled ?Vital Signs Assessment: post-procedure vital signs reviewed and stable ?Respiratory status: spontaneous breathing, nonlabored ventilation and respiratory function stable ?Cardiovascular status: blood pressure returned to baseline and stable ?Postop Assessment: no apparent nausea or vomiting, able to ambulate and adequate PO intake ?Anesthetic complications: no ? ? ?No notable events documented. ? ?Last Vitals:  ?Vitals:  ? 09/11/21 1300 09/11/21 1310  ?BP: (!) 155/76 114/71  ?Pulse: 82 67  ?Resp: 15 16  ?Temp:  36.6 ?C  ?SpO2: 95% 95%  ?  ?Last Pain:  ?Vitals:  ? 09/11/21 1310  ?TempSrc:   ?PainSc: 0-No pain  ? ? ?  ?  ?  ?  ?  ?  ? ?Howard Patton,E. Yared Barefoot ? ? ? ? ?

## 2021-09-11 NOTE — Anesthesia Procedure Notes (Signed)
Procedure Name: Intubation ?Date/Time: 09/11/2021 7:35 AM ?Performed by: Willa Frater, CRNA ?Pre-anesthesia Checklist: Patient identified, Emergency Drugs available, Suction available and Patient being monitored ?Patient Re-evaluated:Patient Re-evaluated prior to induction ?Oxygen Delivery Method: Circle system utilized ?Preoxygenation: Pre-oxygenation with 100% oxygen ?Induction Type: IV induction ?Ventilation: Mask ventilation without difficulty ?Laryngoscope Size: Mac and 3 ?Grade View: Grade I ?Tube type: Oral ?Tube size: 7.0 mm ?Number of attempts: 1 ?Airway Equipment and Method: Stylet and Oral airway ?Placement Confirmation: ETT inserted through vocal cords under direct vision, positive ETCO2 and breath sounds checked- equal and bilateral ?Secured at: 22 cm ?Tube secured with: Tape ?Dental Injury: Teeth and Oropharynx as per pre-operative assessment  ? ? ? ? ?

## 2021-09-11 NOTE — Interval H&P Note (Signed)
History and Physical Interval Note: ? ?09/11/2021 ?6:50 AM ? ?Nicole Schaefer  has presented today for surgery, with the diagnosis of macromastia, chronic neck and back pain.  The various methods of treatment have been discussed with the patient and family. After consideration of risks, benefits and other options for treatment, the patient has consented to  Procedure(s): ?MAMMARY REDUCTION  (BREAST) (Bilateral) as a surgical intervention.  The patient's history has been reviewed, patient examined, no change in status, stable for surgery.  I have reviewed the patient's chart and labs.  Questions were answered to the patient's satisfaction.   ? ? ?[name] ? ? ?

## 2021-09-11 NOTE — Op Note (Signed)
Operative Note  ? ?DATE OF OPERATION: 3.28.2023 ? ?LOCATION: [facility]-outpatient ? ?SURGICAL DIVISION: Plastic Surgery ? ?PREOPERATIVE DIAGNOSES:  1. Macromastia 2. Chronic neck and back pain ? ?POSTOPERATIVE DIAGNOSES:  same ? ?PROCEDURE:  Bilateral breast reduction ? ?SURGEON: Glenna Fellows MD MBA ? ?ASSISTANT: none ? ?ANESTHESIA:  General.  ? ?EBL: 45 ml ? ?COMPLICATIONS: None immediate.  ? ?INDICATIONS FOR PROCEDURE:  ?The patient, Nicole Schaefer, is a 21 y.o. female born on 2000-12-11, is here for treatment chronic neck and back pain in setting of macromastia that has failed conservative measures. ?  ?FINDINGS: right reduction 649 g left reduction 1236 g ? ?DESCRIPTION OF PROCEDURE:  ?The patient was marked standing in the preoperative area to mark sternal notch, chest midline, anterior axillary lines, inframammary folds. The location of new nipple areolar complex was marked at level of on inframammary fold on anterior surface breast by palpation. This was marked symmetric over bilateral breasts. With aid of Wise pattern marker, location of new nipple areolar complex and vertical limbs (7 cm) were marked by displacement of breasts along meridian. The patient was taken to the operating room. SCDs were placed and IV antibiotics were given. The patient's operative site was prepped and draped in a sterile fashion. A time out was performed and all information was confirmed to be correct.   ?  ?Over left breast, superior medial pedicle marked and nipple areolar complex incised with 45 mm diameter marker. Pedicle deepithlialized and developed to chest wall. Breast tissue resected over lower pole. Medial and lateral flaps developed. Additional lateral and superior breast tissue excised. Breast tailor tacked closed.  ?  ?I then directed attention to right breast where superior medial pedicle designed. NAC marked with 45 mm diameter marker. The pedicle was deepithelialized. Pedicle developed to chest wall.  Breast tissue resected over lower pole. Medial and lateral flaps developed. Additional lateral and superior pole breast tissue excised. Breast tailor tacked closed, and patient brought to upright sitting position and assessed for symmetry. Patent returned to supine position. Breast cavities irrigated and hemostasis obtained. Local anesthetic infiltrated throughout each breast. 15 Fr JP placed in each breast and secured with 2-0 nylon. Closure completed bilateral with 3-0 vicryl to approximate dermis along inframammary fold and vertical limb. NAC inset with 4-0 vicryl in dermis. Skin closure completed with 4-0 monocryl subcuticular throughout vertical limbs and NAC. 4-0 monocryl and 4-0 Stratafix subcuticular skin closure completed along inframammary folds. Tissue adhesive applied. Dry dressing and breast binder applied. ?  ?The patient was allowed to wake from anesthesia, extubated and taken to the recovery room in satisfactory condition.  ? ?SPECIMENS: right and left breast reduction ? ?DRAINS: 15 Fr JP in right and left breast ? ?

## 2021-09-11 NOTE — Discharge Instructions (Signed)

## 2021-09-11 NOTE — Transfer of Care (Signed)
Immediate Anesthesia Transfer of Care Note ? ?Patient: Nicole Schaefer ? ?Procedure(s) Performed: MAMMARY REDUCTION  (BREAST) (Bilateral: Breast) ? ?Patient Location: PACU ? ?Anesthesia Type:General ? ?Level of Consciousness: drowsy ? ?Airway & Oxygen Therapy: Patient Spontanous Breathing and Patient connected to face mask oxygen ? ?Post-op Assessment: Report given to RN and Post -op Vital signs reviewed and stable ? ?Post vital signs: Reviewed and stable ? ?Last Vitals:  ?Vitals Value Taken Time  ?BP 128/67 09/11/21 1139  ?Temp    ?Pulse 96 09/11/21 1142  ?Resp 15 09/11/21 1142  ?SpO2 100 % 09/11/21 1142  ?Vitals shown include unvalidated device data. ? ?Last Pain:  ?Vitals:  ? 09/11/21 0636  ?TempSrc: Oral  ?PainSc: 0-No pain  ?   ? ?Patients Stated Pain Goal: 4 (09/11/21 0636) ? ?Complications: No notable events documented. ?

## 2021-09-12 ENCOUNTER — Encounter (HOSPITAL_BASED_OUTPATIENT_CLINIC_OR_DEPARTMENT_OTHER): Payer: Self-pay | Admitting: Plastic Surgery

## 2021-09-12 LAB — SURGICAL PATHOLOGY

## 2021-10-04 IMAGING — US US OB COMP LESS 14 WK
1 series · 15 of 28 positions shown · non-contrast
Comparison: None.

CLINICAL DATA: Pelvic cramping. Gestational age by last menstrual
period is 15 weeks and 4 days.

EXAM:
OBSTETRIC <14 WK ULTRASOUND
TECHNIQUE: Transabdominal ultrasound was performed for evaluation of the
gestation as well as the maternal uterus and adnexal regions.

[Series 1: us ob comp less 14 wk · 15 of 35 slices shown]
[im 1/35]
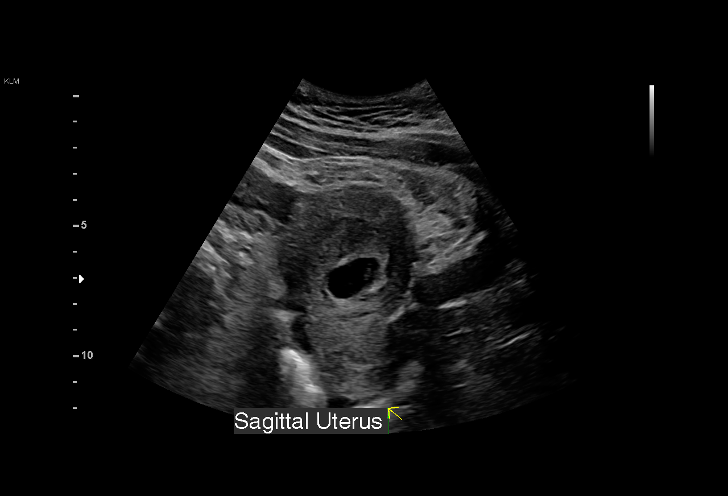
[im 3/35]
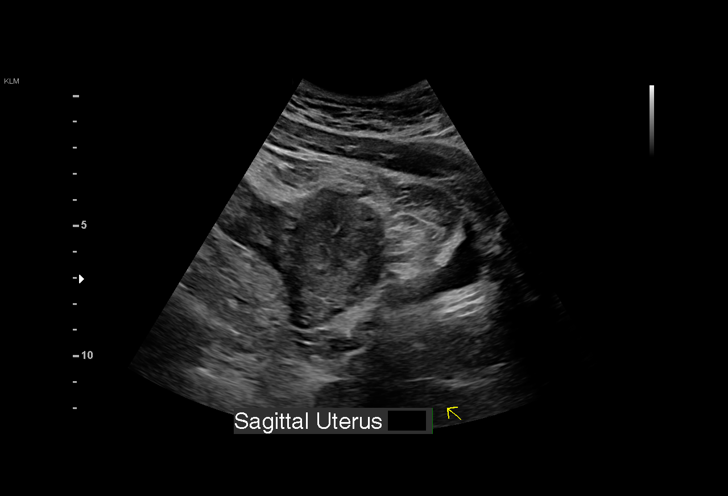
[im 6/35]
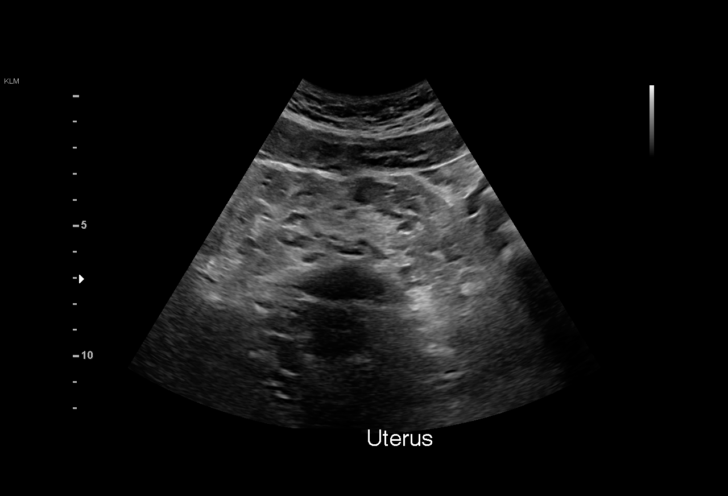
[im 8/35]
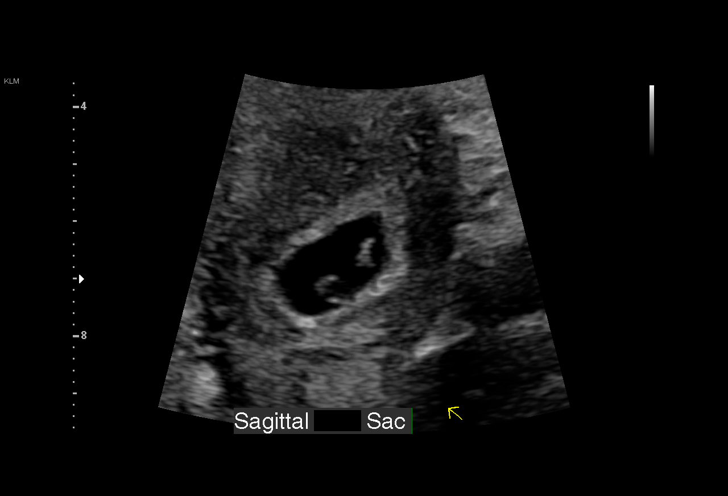
[im 11/35]
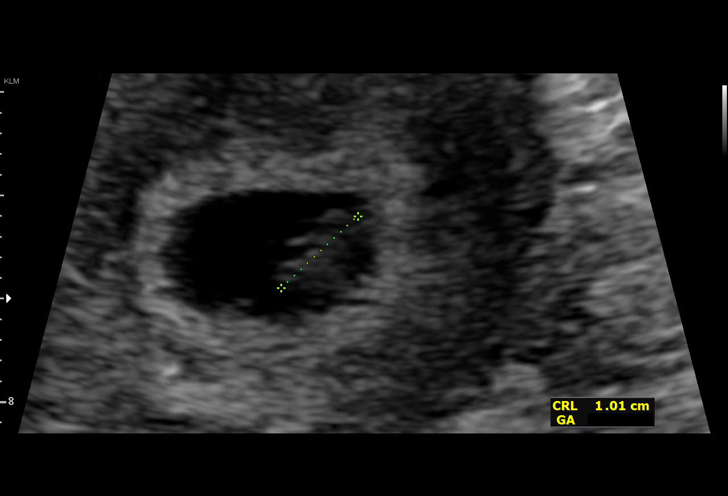
[im 13/35]
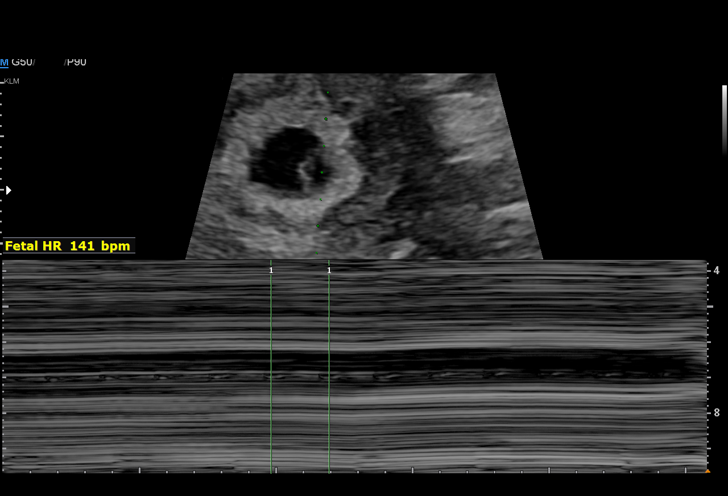
[im 16/35]
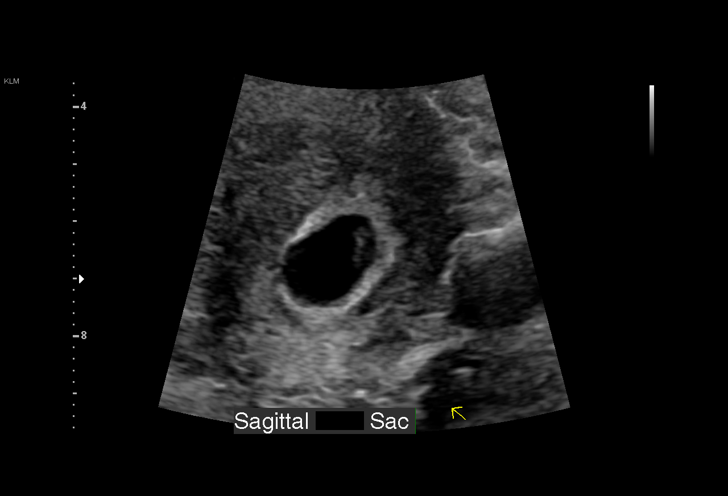
[im 18/35]
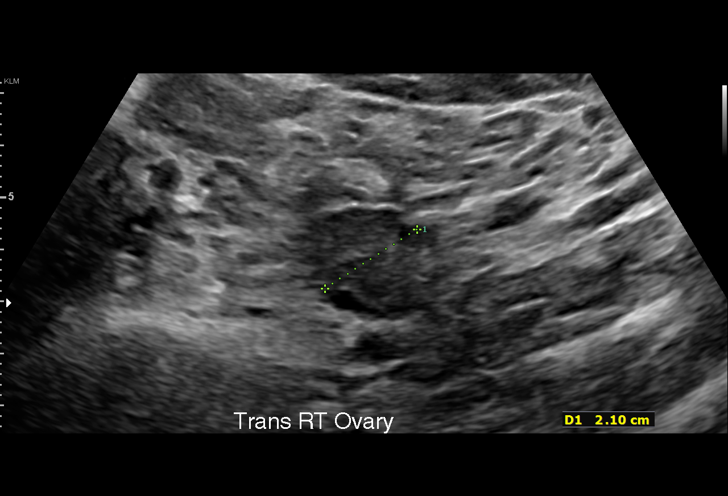
[im 19/35]
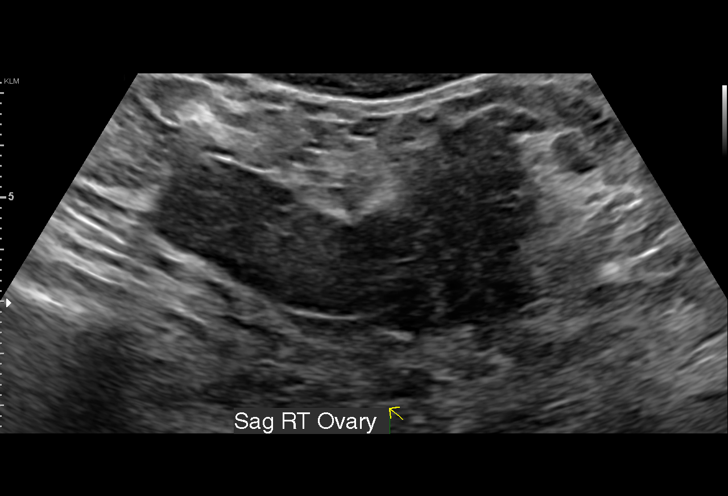
[im 22/35]
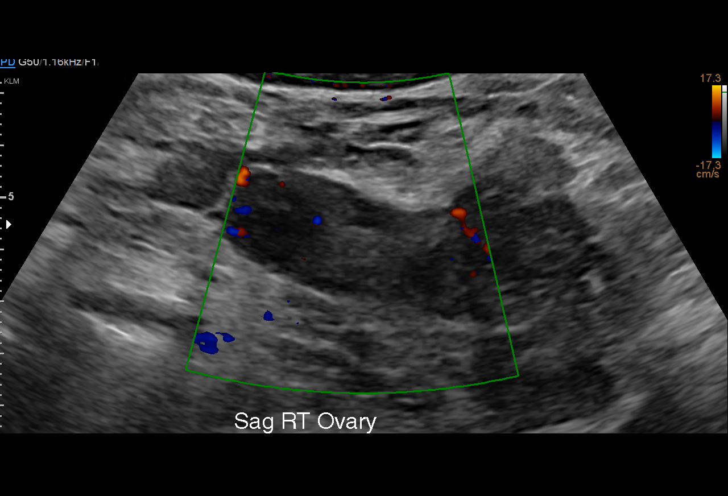
[im 24/35]
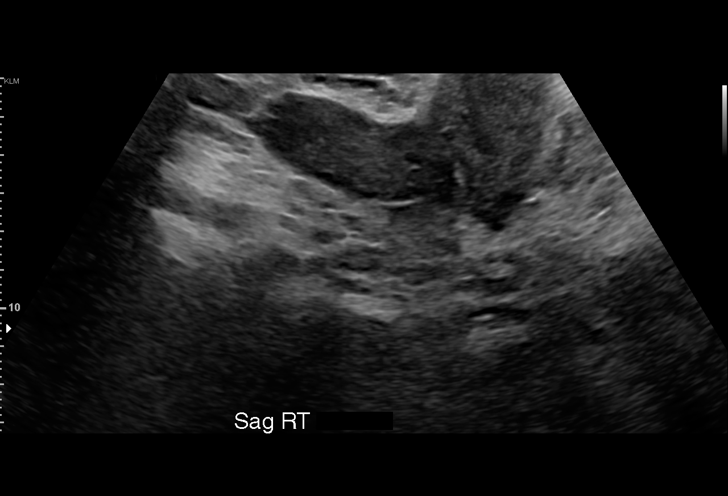
[im 27/35]
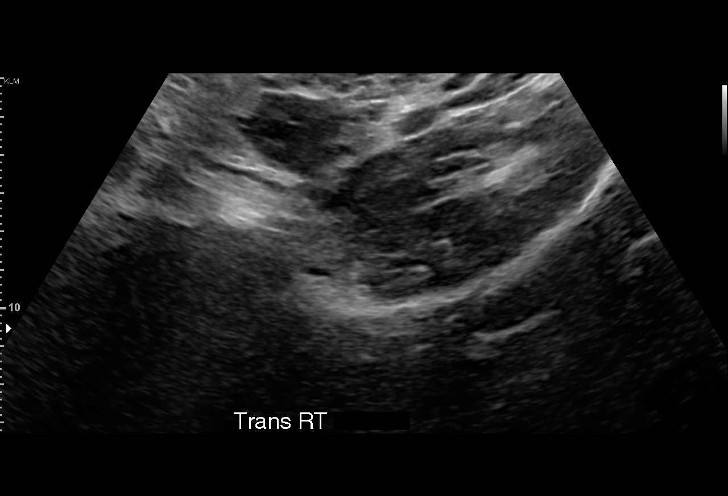
[im 29/35]
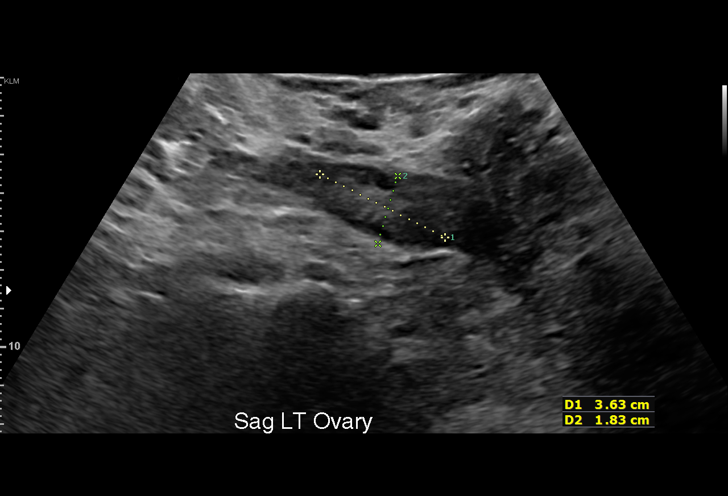
[im 32/35]
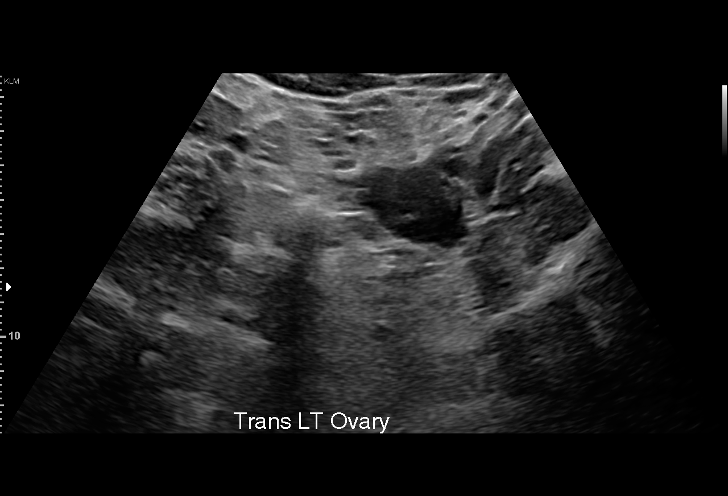
[im 35/35]
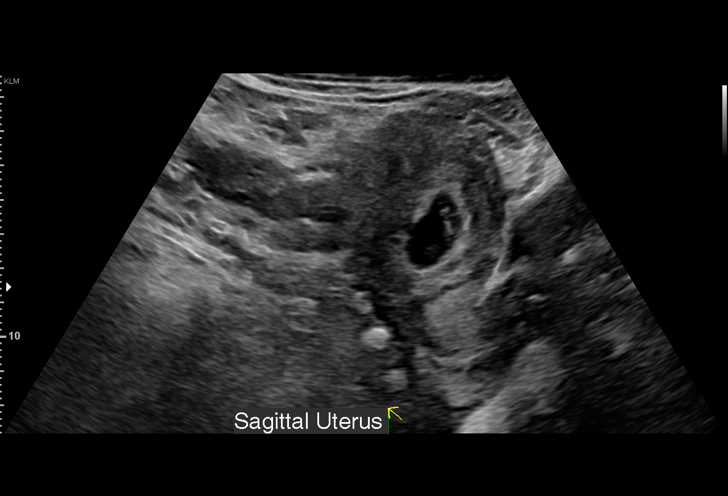

[15 of 28 positions shown; findings below may reference images not displayed]

FINDINGS: Intrauterine gestational sac: Single

Yolk sac:  Visualized

Embryo:  Visualized

Cardiac Activity: Visualized

Heart Rate: 141 bpm

CRL:   10.6 mm   7 w 1 d                  US EDC: 05/28/2021

Subchorionic hemorrhage:  None visualized.

Maternal uterus/adnexae:

Subchorionic hemorrhage: None

Right ovary: Normal containing small corpus luteum

Left ovary: Normal

Other :None

Free fluid:  None
IMPRESSION: 1. Single living intrauterine gestation with estimated gestational
age of 7 weeks and 1 day. No complications identified. Note: The
gestational age by today's ultrasound is discordant with the
clinical gestational age of 15 weeks and 4 days.

## 2021-11-05 DIAGNOSIS — J302 Other seasonal allergic rhinitis: Secondary | ICD-10-CM | POA: Diagnosis not present

## 2021-11-05 DIAGNOSIS — Z9109 Other allergy status, other than to drugs and biological substances: Secondary | ICD-10-CM | POA: Diagnosis not present

## 2021-12-12 DIAGNOSIS — Z9889 Other specified postprocedural states: Secondary | ICD-10-CM | POA: Diagnosis not present

## 2022-01-02 DIAGNOSIS — Z9889 Other specified postprocedural states: Secondary | ICD-10-CM | POA: Diagnosis not present

## 2022-01-21 DIAGNOSIS — Z9889 Other specified postprocedural states: Secondary | ICD-10-CM | POA: Diagnosis not present

## 2022-03-18 DIAGNOSIS — Z1159 Encounter for screening for other viral diseases: Secondary | ICD-10-CM | POA: Diagnosis not present

## 2022-03-18 DIAGNOSIS — Z23 Encounter for immunization: Secondary | ICD-10-CM | POA: Diagnosis not present

## 2022-03-18 DIAGNOSIS — Z0000 Encounter for general adult medical examination without abnormal findings: Secondary | ICD-10-CM | POA: Diagnosis not present

## 2022-03-18 DIAGNOSIS — Z113 Encounter for screening for infections with a predominantly sexual mode of transmission: Secondary | ICD-10-CM | POA: Diagnosis not present

## 2022-03-18 DIAGNOSIS — Z114 Encounter for screening for human immunodeficiency virus [HIV]: Secondary | ICD-10-CM | POA: Diagnosis not present

## 2022-03-18 DIAGNOSIS — E559 Vitamin D deficiency, unspecified: Secondary | ICD-10-CM | POA: Diagnosis not present

## 2022-03-18 DIAGNOSIS — Z124 Encounter for screening for malignant neoplasm of cervix: Secondary | ICD-10-CM | POA: Diagnosis not present

## 2022-04-29 DIAGNOSIS — Z9889 Other specified postprocedural states: Secondary | ICD-10-CM | POA: Diagnosis not present

## 2022-06-06 DIAGNOSIS — H66003 Acute suppurative otitis media without spontaneous rupture of ear drum, bilateral: Secondary | ICD-10-CM | POA: Diagnosis not present

## 2022-07-17 DIAGNOSIS — J4530 Mild persistent asthma, uncomplicated: Secondary | ICD-10-CM | POA: Diagnosis not present

## 2022-07-17 DIAGNOSIS — J45990 Exercise induced bronchospasm: Secondary | ICD-10-CM | POA: Diagnosis not present

## 2022-08-02 DIAGNOSIS — G44209 Tension-type headache, unspecified, not intractable: Secondary | ICD-10-CM | POA: Diagnosis not present

## 2022-11-19 ENCOUNTER — Ambulatory Visit
Admission: EM | Admit: 2022-11-19 | Discharge: 2022-11-19 | Disposition: A | Discharge status: Home / Self Care | Payer: Medicaid Other | Attending: Urgent Care | Admitting: Urgent Care

## 2022-11-19 DIAGNOSIS — J4530 Mild persistent asthma, uncomplicated: Secondary | ICD-10-CM | POA: Diagnosis not present

## 2022-11-19 MED ORDER — NEBULIZER MASK ADULT MISC: 0 refills | Status: AC

## 2022-11-19 MED ORDER — PREDNISONE 20 MG PO TABS: ORAL_TABLET | ORAL | 0 refills | Status: AC

## 2022-11-19 NOTE — ED Triage Notes (Addendum)
Pt presents with c/o localized chest tightness x 3 days. Hx of asthma, the inhaler and breathing tx has not helped.  Pt states tightness worsens when moving around, and has mild tightness when sitting and standing still. Denies headaches, SOB, coughing, n/v/d and fevers.

## 2022-11-19 NOTE — ED Provider Notes (Addendum)
[facility]  Note:  This document was prepared using Conservation officer, historic buildings and may include unintentional dictation errors.  MRN: [id number] DOB: May 19, 2001  Subjective:   Nicole Schaefer is a 22 y.o. female presenting for 3-day history of chest tightness, mild shortness of breath.  Patient states that when she becomes a bit more active she feels the symptoms.  Has a history of asthma, was supposed to have fluticasone for daily maintenance but was not covered by her insurance.  Therefore she has just been using her albuterol inhaler.  Denies fever, body aches, sinus symptoms.  She does take Zyrtec and Claritin daily as well.  Does not want a COVID or flu test.  Does not feel sick.  No current facility-administered medications for this encounter.  Current Outpatient Medications:    Blood Pressure Monitoring (BLOOD PRESSURE KIT) DEVI, 1 Device by Does not apply route as needed. (Patient not taking: Reported on 05/02/2021), Disp: 1 each, Rfl: 0   cetirizine (ZYRTEC) 10 MG tablet, Take 10 mg by mouth daily., Disp: , Rfl:    fluticasone (FLONASE) 50 MCG/ACT nasal spray, SMARTSIG:2 Spray(s) Both Nares Daily PRN, Disp: , Rfl:    ibuprofen (ADVIL) 600 MG tablet, Take 1 tablet (600 mg total) by mouth every 6 (six) hours. (Patient not taking: Reported on 06/13/2021), Disp: 30 tablet, Rfl: 0   loratadine (CLARITIN) 10 MG tablet, Take 10 mg by mouth daily., Disp: , Rfl:    norethindrone-ethinyl estradiol-FE (JUNEL FE 1/20) 1-20 MG-MCG tablet, Take 1 tablet by mouth daily., Disp: 28 tablet, Rfl: 11   VENTOLIN HFA 108 (90 Base) MCG/ACT inhaler, Inhale 2 puffs into the lungs every 4 (four) hours as needed., Disp: , Rfl:    Allergies  Allergen Reactions   Pollen Extract     Runny nose    Past Medical History:  Diagnosis Date   Asthma    has been a while since inhaler/neb use    Headache    Varicose vein of leg      Past Surgical History:  Procedure Laterality  Date   BREAST REDUCTION SURGERY Bilateral 09/11/2021   Procedure: MAMMARY REDUCTION  (BREAST);  Surgeon: Glenna Fellows, MD;  Location: [facility];  Service: Plastics;  Laterality: Bilateral;   LEG SURGERY Left     Family History  Problem Relation Age of Onset   Healthy Mother    Hypertension Father    Hyperlipidemia Father    Sleep apnea Father    Diabetes Paternal Grandmother    Heart attack Paternal Grandmother     Social History   Tobacco Use   Smoking status: Never   Smokeless tobacco: Never  Vaping Use   Vaping Use: Never used  Substance Use Topics   Alcohol use: No   Drug use: No    ROS   Objective:   Vitals: BP 115/79 (BP Location: Right Arm)   Pulse 94   Temp 98 F (36.7 C) (Oral)   Resp 18   LMP  (Exact Date)   SpO2 96%   Physical Exam Constitutional:      General: She is not in acute distress.    Appearance: Normal appearance. She is well-developed. She is not ill-appearing, toxic-appearing or diaphoretic.  HENT:     Head: Normocephalic and atraumatic.     Nose: Nose normal.     Mouth/Throat:     Mouth: Mucous membranes are moist.  Eyes:     General: No scleral icterus.  Right eye: No discharge.        Left eye: No discharge.     Extraocular Movements: Extraocular movements intact.  Cardiovascular:     Rate and Rhythm: Normal rate and regular rhythm.     Heart sounds: Normal heart sounds. No murmur heard.    No friction rub. No gallop.  Pulmonary:     Effort: Pulmonary effort is normal. No respiratory distress.     Breath sounds: No stridor. No wheezing, rhonchi or rales.  Chest:     Chest wall: No tenderness.  Skin:    General: Skin is warm and dry.  Neurological:     General: No focal deficit present.     Mental Status: She is alert and oriented to person, place, and time.  Psychiatric:        Mood and Affect: Mood normal.        Behavior: Behavior normal.     Assessment and Plan :   PDMP not reviewed  this encounter.  1. Mild persistent asthma, uncomplicated    Deferred imaging given clear cardiopulmonary exam, hemodynamically stable vital signs. Recommended oral prednisone course.  Patient just filled albuterol inhaler.  She requested a nebulizer mask prescription.  Follow-up with her PCP.  Counseled patient on potential for adverse effects with medications prescribed/recommended today, ER and return-to-clinic precautions discussed, patient verbalized understanding.    [name], [place] 11/19/22 1610

## 2022-12-30 DIAGNOSIS — J4530 Mild persistent asthma, uncomplicated: Secondary | ICD-10-CM | POA: Diagnosis not present

## 2022-12-30 DIAGNOSIS — Z09 Encounter for follow-up examination after completed treatment for conditions other than malignant neoplasm: Secondary | ICD-10-CM | POA: Diagnosis not present

## 2022-12-30 DIAGNOSIS — Z91199 Patient's noncompliance with other medical treatment and regimen due to unspecified reason: Secondary | ICD-10-CM | POA: Diagnosis not present

## 2022-12-30 DIAGNOSIS — J302 Other seasonal allergic rhinitis: Secondary | ICD-10-CM | POA: Diagnosis not present

## 2023-04-01 DIAGNOSIS — Z3200 Encounter for pregnancy test, result unknown: Secondary | ICD-10-CM | POA: Diagnosis not present

## 2023-04-01 DIAGNOSIS — N898 Other specified noninflammatory disorders of vagina: Secondary | ICD-10-CM | POA: Diagnosis not present

## 2023-04-13 ENCOUNTER — Encounter (HOSPITAL_COMMUNITY): Payer: Self-pay

## 2023-04-13 ENCOUNTER — Emergency Department (HOSPITAL_COMMUNITY)
Admission: EM | Admit: 2023-04-13 | Discharge: 2023-04-13 | Disposition: A | Discharge status: Home / Self Care | Payer: Medicaid Other | Attending: Emergency Medicine | Admitting: Emergency Medicine

## 2023-04-13 DIAGNOSIS — J4521 Mild intermittent asthma with (acute) exacerbation: Secondary | ICD-10-CM | POA: Diagnosis not present

## 2023-04-13 DIAGNOSIS — Z7951 Long term (current) use of inhaled steroids: Secondary | ICD-10-CM | POA: Insufficient documentation

## 2023-04-13 MED ORDER — PREDNISONE 20 MG PO TABS
60.0000 mg | ORAL_TABLET | Freq: Once | ORAL | Status: AC
  Administered 2023-04-13: 60 mg via ORAL
  Filled 2023-04-13: qty 3

## 2023-04-13 MED ORDER — IPRATROPIUM-ALBUTEROL 0.5-2.5 (3) MG/3ML IN SOLN
3.0000 mL | Freq: Once | RESPIRATORY_TRACT | Status: AC
  Administered 2023-04-13: 3 mL via RESPIRATORY_TRACT
  Filled 2023-04-13: qty 3

## 2023-04-13 NOTE — ED Provider Notes (Signed)
[place] AT [facility] Provider Note   CSN: [id number] Arrival date & time: 04/13/23  3086     History  Chief Complaint  Patient presents with   Asthma    Nicole Schaefer is a 22 y.o. female with history of asthma who presents to the emergency department complaining of a asthma flare.  Patient states that for the past few days she has been using her inhaler more.  She has been using her Advair inhaler more frequently, does not often use her Ventolin inhaler.  Her chest got significantly tighter this morning, especially with deep breathing.  Has not had much of a cough, no nasal congestion.  No fevers or chills. Does not have nebulizers. Has never been admitted or intubated for asthma before.    Asthma Associated symptoms include shortness of breath.       Home Medications Prior to Admission medications   Medication Sig Start Date End Date Taking? Authorizing Provider  Blood Pressure Monitoring (BLOOD PRESSURE KIT) DEVI 1 Device by Does not apply route as needed. Patient not taking: Reported on 05/02/2021 03/09/21   Constant, [name], MD  cetirizine (ZYRTEC) 10 MG tablet Take 10 mg by mouth daily. 06/30/21   [provider]  fluticasone [name]) 50 MCG/ACT nasal spray SMARTSIG:2 Spray(s) Both Nares Daily PRN 06/11/21   [provider]  ibuprofen (ADVIL) 600 MG tablet Take 1 tablet (600 mg total) by mouth every 6 (six) hours. Patient not taking: Reported on 06/13/2021 05/11/21   [name], MD  loratadine (CLARITIN) 10 MG tablet Take 10 mg by mouth daily.    [provider]  norethindrone-ethinyl estradiol-FE (JUNEL FE 1/20) 1-20 MG-MCG tablet Take 1 tablet by mouth daily. 06/13/21   [name], MD  predniSONE (DELTASONE) 20 MG tablet Take 2 tablets daily with breakfast. 11/19/22   [name], PA-C  Respiratory Therapy Supplies (NEBULIZER MASK ADULT) MISC Use mask with nebulizer machine. 11/19/22   [name], PA-C   VENTOLIN HFA 108 (90 Base) MCG/ACT inhaler Inhale 2 puffs into the lungs every 4 (four) hours as needed. 07/02/21   [provider]      Allergies    Pollen extract    Review of Systems   Review of Systems  Respiratory:  Positive for cough, chest tightness and shortness of breath.   All other systems reviewed and are negative.   Physical Exam Updated Vital Signs BP 113/70   Pulse 74   Temp 97.8 F (36.6 C)   Resp (!) 23   Ht 5\' 3"  (1.6 m)   Wt 102.1 kg   SpO2 99%   BMI 39.86 kg/m  Physical Exam Vitals and nursing note reviewed.  Constitutional:      Appearance: Normal appearance.  HENT:     Head: Normocephalic and atraumatic.  Eyes:     Conjunctiva/sclera: Conjunctivae normal.  Cardiovascular:     Rate and Rhythm: Normal rate and regular rhythm.  Pulmonary:     Effort: Pulmonary effort is normal. No respiratory distress.     Breath sounds: Normal breath sounds. Decreased air movement present.     Comments: Slightly decreased air movement, but overall lungs are clear Abdominal:     General: There is no distension.     Palpations: Abdomen is soft.     Tenderness: There is no abdominal tenderness.  Musculoskeletal:     Right lower leg: No edema.     Left lower leg: No edema.  Skin:  General: Skin is warm and dry.  Neurological:     General: No focal deficit present.     Mental Status: She is alert.     ED Results / Procedures / Treatments   Labs (all labs ordered are listed, but only abnormal results are displayed) Labs Reviewed - No data to display  EKG None  Radiology No results found.  Procedures Procedures    Medications Ordered in ED Medications  ipratropium-albuterol (DUONEB) 0.5-2.5 (3) MG/3ML nebulizer solution 3 mL (3 mLs Nebulization Given 04/13/23 1032)  predniSONE (DELTASONE) tablet 60 mg (60 mg Oral Given 04/13/23 1031)    ED Course/ Medical Decision Making/ A&P                                 Medical Decision  Making Risk Prescription drug management.   This patient is a 22 y.o. female  who presents to the ED for concern of shortness of breath.   Differential diagnoses prior to evaluation: The emergent differential diagnosis includes, but is not limited to,  pericardial effusion/tamponade, arrhythmias, ACS, COPD, asthma, bronchitis, pneumonia, pneumothorax, PE, anemia. This is not an exhaustive differential.   Past Medical History / Co-morbidities / Social History: asthma  Additional history: Chart reviewed. Pertinent results include: No record of hospitalization for asthma previously  Physical Exam: Physical exam performed. The pertinent findings include: Normal vital signs, no acute distress.  Slightly decreased air movement, but clear lung sounds.  Normal oxygen saturation on room air.    Medications: I ordered medication including DuoNeb and prednisone.  I have reviewed the patients home medicines and have made adjustments as needed.  On reevaluation patient states that symptoms have improved, only having mild chest tightness.    Disposition: After consideration of the diagnostic results and the patients response to treatment, I feel that emergency department workup does not suggest an emergent condition requiring admission or immediate intervention beyond what has been performed at this time. The plan is: Discharged home with ongoing management of asthma exacerbation.  Patient feeling improved after treatments given in the ER.  She is clinically well-appearing, not septic.  She feels comfortable with managing her symptoms at home with her prescribed inhalers. The patient is safe for discharge and has been instructed to return immediately for worsening symptoms, change in symptoms or any other concerns.  Final Clinical Impression(s) / ED Diagnoses Final diagnoses:  Mild intermittent asthma with exacerbation    Rx / DC Orders ED Discharge Orders     None      Portions of this  report may have been transcribed using voice recognition software. Every effort was made to ensure accuracy; however, inadvertent computerized transcription errors may be present.    Su Monks, PA-C 04/13/23 1150    Horton, Clabe Seal, DO 04/13/23 1606

## 2023-04-13 NOTE — ED Triage Notes (Signed)
Pt presents with c/o asthma flare-up. Pt reports she has been having to use her inhaler the last few days. Pt reports some tightness in her chest when she takes a deep breath.

## 2023-04-13 NOTE — Discharge Instructions (Addendum)
You were seen in the ER for asthma exacerbation.  I'm glad you're feeling somewhat better after the breathing treatment and steroids given in the ER. I recommend using your ventolin inhaler 1-2 puffs every 4 hours as needed for shortness of breath or chest tightness.   Continue to monitor how you're doing and return to the ER for new or worsening symptoms.

## 2023-04-14 ENCOUNTER — Other Ambulatory Visit: Payer: Self-pay | Admitting: Physician Assistant

## 2023-04-14 DIAGNOSIS — E7800 Pure hypercholesterolemia, unspecified: Secondary | ICD-10-CM | POA: Diagnosis not present

## 2023-04-14 DIAGNOSIS — Z23 Encounter for immunization: Secondary | ICD-10-CM | POA: Diagnosis not present

## 2023-04-14 DIAGNOSIS — J4530 Mild persistent asthma, uncomplicated: Secondary | ICD-10-CM | POA: Diagnosis not present

## 2023-04-14 DIAGNOSIS — Z0001 Encounter for general adult medical examination with abnormal findings: Secondary | ICD-10-CM | POA: Diagnosis not present

## 2023-04-14 DIAGNOSIS — E559 Vitamin D deficiency, unspecified: Secondary | ICD-10-CM | POA: Diagnosis not present

## 2023-04-14 DIAGNOSIS — Z3041 Encounter for surveillance of contraceptive pills: Secondary | ICD-10-CM | POA: Diagnosis not present

## 2023-04-14 DIAGNOSIS — Z9109 Other allergy status, other than to drugs and biological substances: Secondary | ICD-10-CM | POA: Diagnosis not present

## 2023-04-14 DIAGNOSIS — N6314 Unspecified lump in the right breast, lower inner quadrant: Secondary | ICD-10-CM | POA: Diagnosis not present

## 2023-04-14 DIAGNOSIS — Z131 Encounter for screening for diabetes mellitus: Secondary | ICD-10-CM | POA: Diagnosis not present

## 2023-04-14 DIAGNOSIS — N6310 Unspecified lump in the right breast, unspecified quadrant: Secondary | ICD-10-CM

## 2023-04-14 DIAGNOSIS — Z113 Encounter for screening for infections with a predominantly sexual mode of transmission: Secondary | ICD-10-CM | POA: Diagnosis not present

## 2023-04-21 DIAGNOSIS — E7800 Pure hypercholesterolemia, unspecified: Secondary | ICD-10-CM | POA: Diagnosis not present

## 2023-04-22 ENCOUNTER — Ambulatory Visit
Admission: RE | Admit: 2023-04-22 | Discharge: 2023-04-22 | Disposition: A | Discharge status: Home / Self Care | Payer: Medicaid Other | Source: Ambulatory Visit | Attending: Physician Assistant | Admitting: Physician Assistant

## 2023-04-22 ENCOUNTER — Other Ambulatory Visit: Payer: Medicaid Other

## 2023-04-22 ENCOUNTER — Other Ambulatory Visit: Payer: Self-pay | Admitting: Physician Assistant

## 2023-04-22 DIAGNOSIS — N6313 Unspecified lump in the right breast, lower outer quadrant: Secondary | ICD-10-CM | POA: Diagnosis not present

## 2023-04-22 DIAGNOSIS — N6310 Unspecified lump in the right breast, unspecified quadrant: Secondary | ICD-10-CM

## 2023-04-22 DIAGNOSIS — N6315 Unspecified lump in the right breast, overlapping quadrants: Secondary | ICD-10-CM | POA: Diagnosis not present

## 2023-04-24 ENCOUNTER — Other Ambulatory Visit: Payer: Medicaid Other

## 2023-05-06 ENCOUNTER — Emergency Department (HOSPITAL_COMMUNITY)
Admission: EM | Admit: 2023-05-06 | Discharge: 2023-05-06 | Disposition: A | Discharge status: Home / Self Care | Payer: Medicaid Other | Attending: Emergency Medicine | Admitting: Emergency Medicine

## 2023-05-06 ENCOUNTER — Emergency Department (HOSPITAL_COMMUNITY): Payer: Medicaid Other

## 2023-05-06 ENCOUNTER — Other Ambulatory Visit: Payer: Self-pay

## 2023-05-06 ENCOUNTER — Encounter (HOSPITAL_COMMUNITY): Payer: Self-pay

## 2023-05-06 DIAGNOSIS — J4520 Mild intermittent asthma, uncomplicated: Secondary | ICD-10-CM | POA: Diagnosis not present

## 2023-05-06 DIAGNOSIS — R0602 Shortness of breath: Secondary | ICD-10-CM | POA: Diagnosis not present

## 2023-05-06 DIAGNOSIS — Z1152 Encounter for screening for COVID-19: Secondary | ICD-10-CM | POA: Insufficient documentation

## 2023-05-06 DIAGNOSIS — R079 Chest pain, unspecified: Secondary | ICD-10-CM | POA: Diagnosis not present

## 2023-05-06 LAB — RESP PANEL BY RT-PCR (RSV, FLU A&B, COVID)  RVPGX2
Influenza A by PCR: NEGATIVE
Influenza B by PCR: NEGATIVE
Resp Syncytial Virus by PCR: NEGATIVE
SARS Coronavirus 2 by RT PCR: NEGATIVE

## 2023-05-06 MED ORDER — IPRATROPIUM-ALBUTEROL 0.5-2.5 (3) MG/3ML IN SOLN
3.0000 mL | Freq: Once | RESPIRATORY_TRACT | Status: AC
  Administered 2023-05-06: 3 mL via RESPIRATORY_TRACT
  Filled 2023-05-06: qty 3

## 2023-05-06 MED ORDER — DEXAMETHASONE SODIUM PHOSPHATE 10 MG/ML IJ SOLN
10.0000 mg | Freq: Once | INTRAMUSCULAR | Status: AC
  Administered 2023-05-06: 10 mg via INTRAMUSCULAR
  Filled 2023-05-06: qty 1

## 2023-05-06 MED ORDER — PREDNISONE 10 MG PO TABS: 20.0000 mg | ORAL_TABLET | Freq: Every day | ORAL | 0 refills | Status: AC

## 2023-05-06 MED ORDER — ALBUTEROL SULFATE HFA 108 (90 BASE) MCG/ACT IN AERS: 1.0000 | INHALATION_SPRAY | Freq: Four times a day (QID) | RESPIRATORY_TRACT | 0 refills | Status: DC | PRN

## 2023-05-06 NOTE — ED Provider Notes (Signed)
[facility] AT [facility] Provider Note   CSN: [id number] Arrival date & time: 05/06/23  0018     History  Chief Complaint  Patient presents with   asthma attack    Nicole Schaefer is a 22 y.o. female.  The history is provided by the patient.  Wheezing Severity:  Moderate Severity compared to prior episodes:  Similar Onset quality:  Sudden Timing:  Constant Progression:  Unchanged Chronicity:  Recurrent Context: not exercise and not medical treatments   Context comment:  Weather change Relieved by:  Nothing Worsened by:  Nothing Ineffective treatments:  Beta-agonist inhaler Associated symptoms: no fever, no PND and no stridor   Risk factors: no smoke inhalation   Patient with asthma whose home medication was not helping her breathing.       Home Medications Prior to Admission medications   Medication Sig Start Date End Date Taking? Authorizing Provider  albuterol (VENTOLIN HFA) 108 (90 Base) MCG/ACT inhaler Inhale 1-2 puffs into the lungs every 6 (six) hours as needed for wheezing or shortness of breath. 05/06/23  Yes Hughie Melroy, MD  predniSONE (DELTASONE) 10 MG tablet Take 2 tablets (20 mg total) by mouth daily. 05/06/23  Yes [name], MD  Blood Pressure Monitoring (BLOOD PRESSURE KIT) DEVI 1 Device by Does not apply route as needed. Patient not taking: Reported on 05/02/2021 03/09/21   Constant, [name], MD  cetirizine (ZYRTEC) 10 MG tablet Take 10 mg by mouth daily. 06/30/21   [provider]  fluticasone [name]) 50 MCG/ACT nasal spray SMARTSIG:2 Spray(s) Both Nares Daily PRN 06/11/21   [provider]  ibuprofen (ADVIL) 600 MG tablet Take 1 tablet (600 mg total) by mouth every 6 (six) hours. Patient not taking: Reported on 06/13/2021 05/11/21   [name], MD  loratadine (CLARITIN) 10 MG tablet Take 10 mg by mouth daily.    [provider]  norethindrone-ethinyl estradiol-FE (JUNEL FE 1/20) 1-20  MG-MCG tablet Take 1 tablet by mouth daily. 06/13/21   [name], MD  predniSONE (DELTASONE) 20 MG tablet Take 2 tablets daily with breakfast. 11/19/22   [name], PA-C  Respiratory Therapy Supplies (NEBULIZER MASK ADULT) MISC Use mask with nebulizer machine. 11/19/22   [name], PA-C  VENTOLIN HFA 108 (90 Base) MCG/ACT inhaler Inhale 2 puffs into the lungs every 4 (four) hours as needed. 07/02/21   [provider]      Allergies    Pollen extract    Review of Systems   Review of Systems  Constitutional:  Negative for fever.  HENT:  Negative for facial swelling.   Eyes:  Negative for redness.  Respiratory:  Positive for wheezing. Negative for stridor.   Cardiovascular:  Negative for palpitations, leg swelling and PND.  Gastrointestinal:  Negative for vomiting.  All other systems reviewed and are negative.   Physical Exam Updated Vital Signs BP 116/84   Pulse 84   Temp 98.4 F (36.9 C) (Oral)   Resp 17   Ht 5\' 3"  (1.6 m)   Wt 94.3 kg   SpO2 99%   BMI 36.85 kg/m  Physical Exam Vitals and nursing note reviewed.  Constitutional:      General: She is not in acute distress.    Appearance: Normal appearance. She is well-developed.  HENT:     Head: Normocephalic and atraumatic.     Nose: Nose normal.  Eyes:     Pupils: Pupils are equal, round, and reactive to light.  Cardiovascular:  Rate and Rhythm: Normal rate and regular rhythm.     Pulses: Normal pulses.     Heart sounds: Normal heart sounds.  Pulmonary:     Effort: Pulmonary effort is normal. No respiratory distress.     Breath sounds: Wheezing present.  Abdominal:     General: Bowel sounds are normal. There is no distension.     Palpations: Abdomen is soft.     Tenderness: There is no abdominal tenderness. There is no guarding or rebound.  Musculoskeletal:        General: Normal range of motion.     Cervical back: Normal range of motion and neck supple.  Skin:    General: Skin is dry.      Capillary Refill: Capillary refill takes less than 2 seconds.     Findings: No erythema or rash.  Neurological:     General: No focal deficit present.     Mental Status: She is alert.     Deep Tendon Reflexes: Reflexes normal.  Psychiatric:        Mood and Affect: Mood normal.     ED Results / Procedures / Treatments   Labs (all labs ordered are listed, but only abnormal results are displayed) Labs Reviewed  RESP PANEL BY RT-PCR (RSV, FLU A&B, COVID)  [id number]    EKG None  Radiology DG Chest Portable 1 View  Result Date: 05/06/2023 CLINICAL DATA:  Shortness of breath EXAM: PORTABLE CHEST 1 VIEW COMPARISON:  08/02/2005 FINDINGS: The heart size and mediastinal contours are within normal limits. Both lungs are clear. The visualized skeletal structures are unremarkable. IMPRESSION: No active disease. Electronically Signed   By: [name] M.D.   On: 05/06/2023 02:33    Procedures Procedures    Medications Ordered in ED Medications  dexamethasone (DECADRON) injection 10 mg (10 mg Intramuscular Given 05/06/23 0057)  ipratropium-albuterol (DUONEB) 0.5-2.5 (3) MG/3ML nebulizer solution 3 mL (3 mLs Nebulization Given 05/06/23 0058)    ED Course/ Medical Decision Making/ A&P                                 Medical Decision Making Patient with wheezing   Amount and/or Complexity of Data Reviewed External Data Reviewed: notes.    Details: Previous notes reviewed  Labs: ordered.    Details: Negative covid and flu  Radiology: ordered and independent interpretation performed.    Details: Negative CXR by me  Risk Prescription drug management. Risk Details: PERC negative Wells negative highly doubt PE in this low risk patient.  Patient states this is like previous asthma attacks.  Clear with normal vitals and Xray.  Will start steroids and refill inhaler, stable for discharge.      Final Clinical Impression(s) / ED Diagnoses Final diagnoses:  Mild intermittent asthma,  unspecified whether complicated  Return for intractable cough, coughing up blood, fevers > 100.4 unrelieved by medication, shortness of breath, intractable vomiting, chest pain, shortness of breath, weakness, numbness, changes in speech, facial asymmetry, abdominal pain, passing out, Inability to tolerate liquids or food, cough, altered mental status or any concerns. No signs of systemic illness or infection. The patient is nontoxic-appearing on exam and vital signs are within normal limits.  I have reviewed the triage vital signs and the nursing notes. Pertinent labs & imaging results that were available during my care of the patient were reviewed by me and considered in my medical decision making (see chart for  details). After history, exam, and medical workup I feel the patient has been appropriately medically screened and is safe for discharge home. Pertinent diagnoses were discussed with the patient. Patient was given return precautions.       Rx / DC Orders ED Discharge Orders          Ordered    albuterol (VENTOLIN HFA) 108 (90 Base) MCG/ACT inhaler  Every 6 hours PRN        05/06/23 0243    predniSONE (DELTASONE) 10 MG tablet  Daily        05/06/23 0243              Cypher Paule, MD 05/06/23 1610

## 2023-05-06 NOTE — ED Triage Notes (Signed)
Pt BIB [facility] for asthma exacerbation. Pt had difficulty breathing that led to multiple use of inhaler. Also reported mid sternal pain BP 126/82 HR 96  RR 14  O2 sat 99 RA  Temp 98.1

## 2023-06-23 DIAGNOSIS — E7800 Pure hypercholesterolemia, unspecified: Secondary | ICD-10-CM | POA: Diagnosis not present

## 2023-08-25 DIAGNOSIS — G44209 Tension-type headache, unspecified, not intractable: Secondary | ICD-10-CM | POA: Diagnosis not present

## 2023-08-25 DIAGNOSIS — E7800 Pure hypercholesterolemia, unspecified: Secondary | ICD-10-CM | POA: Diagnosis not present

## 2023-10-02 ENCOUNTER — Encounter (HOSPITAL_COMMUNITY): Payer: Self-pay

## 2023-10-02 ENCOUNTER — Other Ambulatory Visit: Payer: Self-pay

## 2023-10-02 ENCOUNTER — Emergency Department (HOSPITAL_COMMUNITY)
Admission: EM | Admit: 2023-10-02 | Discharge: 2023-10-02 | Discharge status: Against Medical Advice | Attending: Emergency Medicine | Admitting: Emergency Medicine

## 2023-10-02 DIAGNOSIS — M79641 Pain in right hand: Secondary | ICD-10-CM | POA: Insufficient documentation

## 2023-10-02 DIAGNOSIS — Z5321 Procedure and treatment not carried out due to patient leaving prior to being seen by health care provider: Secondary | ICD-10-CM | POA: Insufficient documentation

## 2023-10-02 DIAGNOSIS — G5601 Carpal tunnel syndrome, right upper limb: Secondary | ICD-10-CM | POA: Diagnosis not present
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
## 2023-11-06 ENCOUNTER — Other Ambulatory Visit: Payer: Self-pay | Admitting: Physician Assistant

## 2023-11-06 ENCOUNTER — Ambulatory Visit
Admission: RE | Admit: 2023-11-06 | Discharge: 2023-11-06 | Disposition: A | Discharge status: Home / Self Care | Payer: Medicaid Other | Source: Ambulatory Visit | Attending: Physician Assistant | Admitting: Physician Assistant

## 2023-11-06 DIAGNOSIS — N6313 Unspecified lump in the right breast, lower outer quadrant: Secondary | ICD-10-CM | POA: Diagnosis not present

## 2023-11-06 DIAGNOSIS — N6315 Unspecified lump in the right breast, overlapping quadrants: Secondary | ICD-10-CM | POA: Diagnosis not present

## 2023-11-06 DIAGNOSIS — N6310 Unspecified lump in the right breast, unspecified quadrant: Secondary | ICD-10-CM

## 2023-11-11 DIAGNOSIS — E7800 Pure hypercholesterolemia, unspecified: Secondary | ICD-10-CM | POA: Diagnosis not present

## 2023-11-11 DIAGNOSIS — R519 Headache, unspecified: Secondary | ICD-10-CM | POA: Diagnosis not present

## 2023-11-11 DIAGNOSIS — J3089 Other allergic rhinitis: Secondary | ICD-10-CM | POA: Diagnosis not present

## 2023-11-28 DIAGNOSIS — E7800 Pure hypercholesterolemia, unspecified: Secondary | ICD-10-CM | POA: Diagnosis not present

## 2023-12-23 DIAGNOSIS — F4323 Adjustment disorder with mixed anxiety and depressed mood: Secondary | ICD-10-CM | POA: Diagnosis not present

## 2024-03-06 ENCOUNTER — Other Ambulatory Visit: Payer: Self-pay

## 2024-03-06 ENCOUNTER — Emergency Department (HOSPITAL_COMMUNITY)
Admission: EM | Admit: 2024-03-06 | Discharge: 2024-03-06 | Disposition: A | Discharge status: Home / Self Care | Attending: Emergency Medicine | Admitting: Emergency Medicine

## 2024-03-06 ENCOUNTER — Encounter (HOSPITAL_COMMUNITY): Payer: Self-pay

## 2024-03-06 ENCOUNTER — Emergency Department (HOSPITAL_COMMUNITY)

## 2024-03-06 DIAGNOSIS — R059 Cough, unspecified: Secondary | ICD-10-CM | POA: Diagnosis not present

## 2024-03-06 DIAGNOSIS — J45909 Unspecified asthma, uncomplicated: Secondary | ICD-10-CM | POA: Insufficient documentation

## 2024-03-06 DIAGNOSIS — R0602 Shortness of breath: Secondary | ICD-10-CM | POA: Diagnosis not present

## 2024-03-06 DIAGNOSIS — E876 Hypokalemia: Secondary | ICD-10-CM | POA: Insufficient documentation

## 2024-03-06 DIAGNOSIS — R0789 Other chest pain: Secondary | ICD-10-CM | POA: Diagnosis not present

## 2024-03-06 DIAGNOSIS — R079 Chest pain, unspecified: Secondary | ICD-10-CM | POA: Diagnosis not present

## 2024-03-06 DIAGNOSIS — J45901 Unspecified asthma with (acute) exacerbation: Secondary | ICD-10-CM | POA: Diagnosis not present

## 2024-03-06 LAB — BASIC METABOLIC PANEL WITH GFR
Anion gap: 14 (ref 5–15)
BUN: 9 mg/dL (ref 6–20)
CO2: 22 mmol/L (ref 22–32)
Calcium: 9 mg/dL (ref 8.9–10.3)
Chloride: 103 mmol/L (ref 98–111)
Creatinine, Ser: 0.82 mg/dL (ref 0.44–1.00)
GFR, Estimated: >60 mL/min (ref >60)
Glucose, Bld: 79 mg/dL (ref 70–99)
Potassium: 3.2 mmol/L — ABNORMAL LOW (ref 3.5–5.1)
Sodium: 139 mmol/L (ref 135–145)

## 2024-03-06 LAB — CBC
HCT: 40 % (ref 36.0–46.0)
Hemoglobin: 13.3 g/dL (ref 12.0–15.0)
MCH: 29.9 pg (ref 26.0–34.0)
MCHC: 33.3 g/dL (ref 30.0–36.0)
MCV: 89.9 fL (ref 80.0–100.0)
Platelets: 234 K/uL (ref 150–400)
RBC: 4.45 MIL/uL (ref 3.87–5.11)
RDW: 11.9 % (ref 11.5–15.5)
WBC: 8.6 K/uL (ref 4.0–10.5)
nRBC: 0 % (ref 0.0–0.2)

## 2024-03-06 LAB — HCG, SERUM, QUALITATIVE: Preg, Serum: NEGATIVE

## 2024-03-06 LAB — TROPONIN I (HIGH SENSITIVITY): Troponin I (High Sensitivity): 8 ng/L (ref <18)

## 2024-03-06 MED ORDER — ALBUTEROL SULFATE HFA 108 (90 BASE) MCG/ACT IN AERS: 1.0000 | INHALATION_SPRAY | Freq: Four times a day (QID) | RESPIRATORY_TRACT | 0 refills | Status: AC | PRN

## 2024-03-06 NOTE — ED Notes (Signed)
 Pt verbalized understanding of discharge instructions. Opportunity for questions provided.

## 2024-03-06 NOTE — ED Provider Notes (Signed)
 [facility] Provider Note   CSN: [id number] Arrival date & time: 03/06/24  1519     Patient presents with: Chest Pain and Shortness of Breath   Nicole Schaefer is a 23 y.o. female with a history of mild persistent asthma who presents emergency department with right sided chest pain.  Patient reports that she states that started at 5 AM this morning.  It is worse with movement and pressure.  She feels like it might be her asthma but denies any wheezing, coughing, or exertional dyspnea hemoptysis, fever, palpitations, abdominal pain , nausea,, hx dvt, pe, and does not take exogenous estrogens or OCPs       Chest Pain Associated symptoms: shortness of breath   Shortness of Breath Associated symptoms: chest pain        Prior to Admission medications   Medication Sig Start Date End Date Taking? Authorizing Provider  albuterol  (VENTOLIN  HFA) 108 (90 Base) MCG/ACT inhaler Inhale 1-2 puffs into the lungs every 6 (six) hours as needed for wheezing or shortness of breath. 03/06/24   Analynn Daum, PA-C  Blood Pressure Monitoring (BLOOD PRESSURE KIT) DEVI 1 Device by Does not apply route as needed. Patient not taking: Reported on 05/02/2021 03/09/21   Constant, [name], MD  cetirizine (ZYRTEC) 10 MG tablet Take 10 mg by mouth daily. 06/30/21   [provider]  fluticasone OREN) 50 MCG/ACT nasal spray SMARTSIG:2 Spray(s) Both Nares Daily PRN 06/11/21   [provider]  ibuprofen  (ADVIL ) 600 MG tablet Take 1 tablet (600 mg total) by mouth every 6 (six) hours. Patient not taking: Reported on 06/13/2021 05/11/21   Das, Anuka, MD  loratadine (CLARITIN) 10 MG tablet Take 10 mg by mouth daily.    [provider]  norethindrone -ethinyl estradiol-FE (JUNEL FE 1/20) 1-20 MG-MCG tablet Take 1 tablet by mouth daily. 06/13/21   Cleatus Moccasin, MD  predniSONE  (DELTASONE ) 10 MG tablet Take 2 tablets (20 mg total) by mouth daily.  05/06/23   Palumbo, April, MD  predniSONE  (DELTASONE ) 20 MG tablet Take 2 tablets daily with breakfast. 11/19/22   Christopher Savannah, PA-C  Respiratory Therapy Supplies (NEBULIZER MASK ADULT) MISC Use mask with nebulizer machine. 11/19/22   Christopher Savannah, PA-C  VENTOLIN  HFA 108 (90 Base) MCG/ACT inhaler Inhale 2 puffs into the lungs every 4 (four) hours as needed. 07/02/21   [provider]    Allergies: Pollen extract    Review of Systems  Respiratory:  Positive for shortness of breath.   Cardiovascular:  Positive for chest pain.    Updated Vital Signs BP 132/72   Pulse 89   Temp 98.2 F (36.8 C)   Resp 18   Ht 5' 3 (1.6 m)   Wt 94.3 kg   SpO2 100%   BMI 36.85 kg/m   Physical Exam Vitals and nursing note reviewed.  Constitutional:      General: She is not in acute distress.    Appearance: She is well-developed. She is not diaphoretic.  HENT:     Head: Normocephalic and atraumatic.     Right Ear: External ear normal.     Left Ear: External ear normal.     Nose: Nose normal.     Mouth/Throat:     Mouth: Mucous membranes are moist.  Eyes:     General: No scleral icterus.    Conjunctiva/sclera: Conjunctivae normal.  Cardiovascular:     Rate and Rhythm: Normal rate and regular rhythm.  Heart sounds: Normal heart sounds. No murmur heard.    No friction rub. No gallop.  Pulmonary:     Effort: Pulmonary effort is normal. No respiratory distress.     Breath sounds: Normal breath sounds and air entry.     Comments: Normal air entry, no wheezing, audible breath sounds in all lung fields with inspiration and expiration. Chest:     Chest wall: Tenderness present. No edema.       Comments: Ttp Right chest wall- pain reproduces her complaint  Abdominal:     General: Bowel sounds are normal. There is no distension.     Palpations: Abdomen is soft. There is no mass.     Tenderness: There is no abdominal tenderness. There is no guarding.  Musculoskeletal:     Cervical back:  Normal range of motion.     Right lower leg: No edema.     Left lower leg: No edema.  Skin:    General: Skin is warm and dry.  Neurological:     Mental Status: She is alert and oriented to person, place, and time.  Psychiatric:        Behavior: Behavior normal.     (all labs ordered are listed, but only abnormal results are displayed) Labs Reviewed  BASIC METABOLIC PANEL WITH GFR - Abnormal; Notable for the following components:      Result Value   Potassium 3.2 (*)    All other components within normal limits  CBC  HCG, SERUM, QUALITATIVE  TROPONIN I (HIGH SENSITIVITY)  TROPONIN I (HIGH SENSITIVITY)    EKG: EKG Interpretation Date/Time:  Saturday March 06 2024 15:31:02 EDT Ventricular Rate:  67 PR Interval:  130 QRS Duration:  76 QT Interval:  396 QTC Calculation: 418 R Axis:   59  Text Interpretation: Normal sinus rhythm with sinus arrhythmia Normal ECG When compared with ECG of 06-Nov-2019 23:40, PREVIOUS ECG IS PRESENT No significant change since last tracing Confirmed by Doretha Folks ([id number]) on 03/06/2024 6:24:16 PM  Radiology: [facility] Chest 2 View Result Date: 03/06/2024 CLINICAL DATA:  Asthma flare with cough, shortness of breath, and right-sided chest pain. EXAM: CHEST - 2 VIEW COMPARISON:  05/06/2023. FINDINGS: The heart size and mediastinal contours are within normal limits. No consolidation, effusion, or pneumothorax is seen. No acute osseous abnormality. IMPRESSION: No active cardiopulmonary disease. Electronically Signed   By: Leita Birmingham M.D.   On: 03/06/2024 16:40     Procedures   Medications Ordered in the ED - No data to display              HEART Score: 0                Geneva (Revised) Score: 3, Geneva Score Interpretation: Low Risk Group: 7-9% incidence of pulmonary embolism from several studies PERC Score: 0, PERC Score Interpretation: No need for further workup, as <2% chance of PE.  If no criteria are positive and clinicians pre-test  probability is <15%, PERC Rule criteria are satisfied Medical Decision Making Given the large differential diagnosis for Nicole Schaefer, the decision making in this case is of high complexity.  After evaluating all of the data points in this case, the presentation of Nicole Schaefer is NOT consistent with Acute Coronary Syndrome (ACS) and/or myocardial ischemia, pulmonary embolism, aortic dissection; Borhaave's, significant arrythmia, pneumothorax, cardiac tamponade, or other emergent cardiopulmonary condition.  Further, the presentation of Nicole Schaefer is NOT consistent with pericarditis, myocarditis, cholecystitis, pancreatitis, mediastinitis, endocarditis, new valvular disease.  Additionally, the presentation of Nicole Schaefer NOT consistent with flail chest, cardiac contusion, ARDS, or significant intra-thoracic or intra-abdominal bleeding.  Moreover, this presentation is NOT consistent with pneumonia, sepsis, or pyelonephritis.  The patient has a HEART Score: 0  Patient has reproducible chest wall pain on exam which provokes her symptom of complaint.  She is not wheezing has excellent air movement and audible lung sounds in all lung fields.  She denies specifically any exertional dyspnea coughing wheezing or URI symptoms and I have very low suspicion for asthma exacerbation.  Also typically asthma does not cause chest pain.  However patient does have an expired MDI inhaler and I have refilled this for the patient.   Strict return and follow-up precautions have been given by me personally or by detailed written instruction given verbally by nursing staff using the teach back method to the patient/family/caregiver(s).  Data Reviewed/Counseling: I have reviewed the patient's vital signs, nursing notes, and other relevant tests/information. I had a detailed discussion regarding the historical points, exam findings, and any diagnostic results supporting the discharge diagnosis. I also discussed  the need for outpatient follow-up and the need to return to the ED if symptoms worsen or if there are any questions or concerns that arise at home.    Amount and/or Complexity of Data Reviewed Labs: ordered. Decision-making details documented in ED Course.    Details: No abnormal findings Radiology: ordered and independent interpretation performed.    Details: I personally visualized and interpreted the images using our PACS system. Acute findings include:  Negative cxr  ECG/medicine tests: ordered and independent interpretation performed.    Details: Normal ecg  Risk Prescription drug management.        Final diagnoses:  Chest wall pain    ED Discharge Orders          Ordered    albuterol  (VENTOLIN  HFA) 108 (90 Base) MCG/ACT inhaler  Every 6 hours PRN        03/06/24 1838               Arloa Chroman, PA-C 03/06/24 1845    Doretha Folks, MD 03/06/24 2325

## 2024-03-06 NOTE — ED Notes (Signed)
 Pt refused to allow phlebotomy to draw second troponin. PA made aware and will discuss with MD.

## 2024-03-06 NOTE — ED Triage Notes (Signed)
 PT ambulatory to triage reports CP and mild SHOB that started around 0600.  Pt reports hx of asthma.

## 2024-03-06 NOTE — Discharge Instructions (Signed)
You have been diagnosed by your caregiver as having chest wall pain. °SEEK IMMEDIATE MEDICAL ATTENTION IF: °You develop a fever.  °Your chest pains become severe or intolerable.  °You develop new, unexplained symptoms (problems).  °You develop shortness of breath, nausea, vomiting, sweating or feel light headed.  °You develop a new cough or you cough up blood. ° °

## 2024-03-06 NOTE — ED Triage Notes (Signed)
 Pt came in via POV d/t Rt sided CP that started at approx. 0700. Denies radiation of pain, some SOB this AM but not currently. A/Ox4, rates her pain 7/10 during triage.

## 2024-03-06 NOTE — ED Notes (Signed)
 Pt stated she would like to leave

## 2024-03-15 DIAGNOSIS — R0789 Other chest pain: Secondary | ICD-10-CM | POA: Diagnosis not present

## 2024-04-30 DIAGNOSIS — Z1159 Encounter for screening for other viral diseases: Secondary | ICD-10-CM | POA: Diagnosis not present

## 2024-04-30 DIAGNOSIS — E559 Vitamin D deficiency, unspecified: Secondary | ICD-10-CM | POA: Diagnosis not present

## 2024-04-30 DIAGNOSIS — Z130 Encounter for screening for diseases of the blood and blood-forming organs and certain disorders involving the immune mechanism: Secondary | ICD-10-CM | POA: Diagnosis not present

## 2024-04-30 DIAGNOSIS — Z23 Encounter for immunization: Secondary | ICD-10-CM | POA: Diagnosis not present

## 2024-04-30 DIAGNOSIS — Z131 Encounter for screening for diabetes mellitus: Secondary | ICD-10-CM | POA: Diagnosis not present

## 2024-04-30 DIAGNOSIS — Z0000 Encounter for general adult medical examination without abnormal findings: Secondary | ICD-10-CM | POA: Diagnosis not present

## 2024-04-30 DIAGNOSIS — Z1329 Encounter for screening for other suspected endocrine disorder: Secondary | ICD-10-CM | POA: Diagnosis not present

## 2024-04-30 DIAGNOSIS — Z113 Encounter for screening for infections with a predominantly sexual mode of transmission: Secondary | ICD-10-CM | POA: Diagnosis not present

## 2024-04-30 DIAGNOSIS — E7800 Pure hypercholesterolemia, unspecified: Secondary | ICD-10-CM | POA: Diagnosis not present

## 2024-05-10 ENCOUNTER — Other Ambulatory Visit

## 2024-05-17 ENCOUNTER — Other Ambulatory Visit

## 2024-05-17 DIAGNOSIS — Z113 Encounter for screening for infections with a predominantly sexual mode of transmission: Secondary | ICD-10-CM | POA: Diagnosis not present

## 2024-05-17 DIAGNOSIS — Z130 Encounter for screening for diseases of the blood and blood-forming organs and certain disorders involving the immune mechanism: Secondary | ICD-10-CM | POA: Diagnosis not present

## 2024-05-17 DIAGNOSIS — E559 Vitamin D deficiency, unspecified: Secondary | ICD-10-CM | POA: Diagnosis not present

## 2024-05-17 DIAGNOSIS — E7800 Pure hypercholesterolemia, unspecified: Secondary | ICD-10-CM | POA: Diagnosis not present

## 2024-05-17 DIAGNOSIS — Z131 Encounter for screening for diabetes mellitus: Secondary | ICD-10-CM | POA: Diagnosis not present

## 2024-05-17 DIAGNOSIS — Z1329 Encounter for screening for other suspected endocrine disorder: Secondary | ICD-10-CM | POA: Diagnosis not present

## 2024-05-17 DIAGNOSIS — Z1159 Encounter for screening for other viral diseases: Secondary | ICD-10-CM | POA: Diagnosis not present

## 2024-05-24 DIAGNOSIS — Q279 Congenital malformation of peripheral vascular system, unspecified: Secondary | ICD-10-CM | POA: Diagnosis not present

## 2024-05-31 ENCOUNTER — Ambulatory Visit
Admission: RE | Admit: 2024-05-31 | Discharge: 2024-05-31 | Disposition: A | Source: Ambulatory Visit | Attending: Physician Assistant | Admitting: Physician Assistant

## 2024-05-31 DIAGNOSIS — N6310 Unspecified lump in the right breast, unspecified quadrant: Secondary | ICD-10-CM

## 2024-05-31 DIAGNOSIS — N6315 Unspecified lump in the right breast, overlapping quadrants: Secondary | ICD-10-CM | POA: Diagnosis not present

## 2024-06-24 ENCOUNTER — Ambulatory Visit (INDEPENDENT_AMBULATORY_CARE_PROVIDER_SITE_OTHER): Admitting: Internal Medicine

## 2024-06-24 ENCOUNTER — Encounter (HOSPITAL_BASED_OUTPATIENT_CLINIC_OR_DEPARTMENT_OTHER): Payer: Self-pay | Admitting: Internal Medicine

## 2024-06-24 VITALS — BP 102/70 | HR 84 | Ht 63.0 in | Wt 201.2 lb

## 2024-06-24 DIAGNOSIS — E7800 Pure hypercholesterolemia, unspecified: Secondary | ICD-10-CM

## 2024-06-24 NOTE — Progress Notes (Signed)
 "   LIPID CLINIC CONSULT NOTE  Chief Complaint:  Manage dyslipidemia  Primary Care Physician: [facility]  Primary Cardiologist:  None  HPI:  Nicole Schaefer is a 24 y.o. female who is being seen today for the evaluation of dyslipidemia at the request of Duwaine Annabella SAILOR, FNP.  This a pleasant 24 year old female kindly referred for evaluation management of dyslipidemia.  Recently she was noted to have very high cholesterol with total cholesterol 262, triglycerides 125, HDL 44 and LDL 195.  She reports that her father has heart disease and had a bypass in his 19s.  She also has 2 children at home.  She has not been on any medical therapy until recently but her primary care provider started her on pravastatin 40 mg daily.  She has been on that for about a month and seems to be tolerating it.  She has not had repeat lipid testing.  She admits that she needs to work on diet and lifestyle changes.  Particular we discussed today lowering saturated fats and trying to avoid certain foods that may be more likely to cause high cholesterol.  PMHx:  Past Medical History:  Diagnosis Date   Asthma    has been a while since inhaler/neb use    Headache    Varicose vein of leg     Past Surgical History:  Procedure Laterality Date   BREAST REDUCTION SURGERY Bilateral 09/11/2021   Procedure: MAMMARY REDUCTION  (BREAST);  Surgeon: Arelia Filippo, MD;  Location: [facility];  Service: Plastics;  Laterality: Bilateral;   LEG SURGERY Left     FAMHx:  Family History  Problem Relation Age of Onset   Healthy Mother    Hypertension Father    Hyperlipidemia Father    Sleep apnea Father    Diabetes Paternal Grandmother    Heart attack Paternal Grandmother     SOCHx:   reports that she has never smoked. She has never used smokeless tobacco. She reports that she does not drink alcohol and does not use drugs.  ALLERGIES:  Allergies[1]  ROS: Pertinent items  noted in HPI and remainder of comprehensive ROS otherwise negative.  HOME MEDS: Medications Ordered Prior to Encounter[2]  LABS/IMAGING: No results found for this or any previous visit (from the past 48 hours). No results found.  LIPID PANEL: No results found for: CHOL, TRIG, HDL, CHOLHDL, VLDL, LDLCALC, LDLDIRECT  No results found for: LIPOA   WEIGHTS: Wt Readings from Last 3 Encounters:  06/24/24 201 lb 3.2 oz (91.3 kg)  03/06/24 208 lb (94.3 kg)  10/02/23 209 lb 7 oz (95 kg)    VITALS: BP 102/70   Pulse 84   Ht 5' 3 (1.6 m)   Wt 201 lb 3.2 oz (91.3 kg)   SpO2 97%   BMI 35.64 kg/m   EXAM: Deferred  EKG: Deferred  ASSESSMENT: Probable familial hyperlipidemia, LDL greater than 190 Family history of early onset heart disease in her father  PLAN: 1.   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  There is early onset heart disease in her father.  She recently started on moderate intensity pravastatin 40 mg daily.  Guidelines recommend high intensity therapy however we will see what improvement she gets with this.  Hopefully with additional diet and lifestyle measures her cholesterol will come down.  I will go ahead and order an NMR and LP(a) in about 3 months.  In addition we discussed  genetic testing today.  As she has Medicaid this will not cover commercial testing however there may be an option for free FH testing through the health system.  I will reach out to our geneticist to see if this is available and how we would go about ordering that.  Thanks again for the kind referral.  Vinie KYM Maxcy, MD, [facility], FNLA, FACP  [place]  [facility]  Medical Director of the [facility]  [facility] Diplomate of the American Board of Clinical Lipidology Attending Cardiologist  Direct Dial: [phone]  Fax: [phone]  Website:  [email or web address]  Vinie JAYSON Maxcy 06/24/2024, 2:31 PM     [1]  Allergies Allergen Reactions   Pollen Extract     Runny nose  [2]  Current Outpatient Medications on File Prior to Visit  Medication Sig Dispense Refill   albuterol  (VENTOLIN  HFA) 108 (90 Base) MCG/ACT inhaler Inhale 1-2 puffs into the lungs every 6 (six) hours as needed for wheezing or shortness of breath. 1 each 0   Blood Pressure Monitoring (BLOOD PRESSURE KIT) DEVI 1 Device by Does not apply route as needed. 1 each 0   cetirizine (ZYRTEC) 10 MG tablet Take 10 mg by mouth daily.     fluticasone (FLONASE) 50 MCG/ACT nasal spray SMARTSIG:2 Spray(s) Both Nares Daily PRN     fluticasone-salmeterol (ADVAIR HFA) 230-21 MCG/ACT inhaler Inhale 2 puffs into the lungs.     ibuprofen  (ADVIL ) 600 MG tablet Take 1 tablet (600 mg total) by mouth every 6 (six) hours. 30 tablet 0   loratadine (CLARITIN) 10 MG tablet Take 10 mg by mouth daily.     naproxen (NAPROSYN) 500 MG tablet Take 500 mg by mouth.     pravastatin (PRAVACHOL) 40 MG tablet Take 40 mg by mouth.     predniSONE  (DELTASONE ) 20 MG tablet Take 2 tablets daily with breakfast. 10 tablet 0   Respiratory Therapy Supplies (NEBULIZER MASK ADULT) MISC Use mask with nebulizer machine. 1 each 0   VENTOLIN  HFA 108 (90 Base) MCG/ACT inhaler Inhale 2 puffs into the lungs every 4 (four) hours as needed.     Vitamin D, Ergocalciferol, (DRISDOL) 1.25 MG (50000 UNIT) CAPS capsule Take 50,000 Units by mouth every 7 (seven) days.     norethindrone -ethinyl estradiol-FE (JUNEL FE 1/20) 1-20 MG-MCG tablet Take 1 tablet by mouth daily. (Patient not taking: Reported on 06/24/2024) 28 tablet 11   predniSONE  (DELTASONE ) 10 MG tablet Take 2 tablets (20 mg total) by mouth daily. (Patient not taking: Reported on 06/24/2024) 15 tablet 0   No current facility-administered medications on file prior to visit.   "

## 2024-06-24 NOTE — Patient Instructions (Signed)
 Medication Instructions:  No changes *If you need a refill on your cardiac medications before your next appointment, please call your pharmacy*  Lab Work: Your physician recommends that you return for lab work in: 3 months   NMR Lipoprofile and Lp(a)   Testing/Procedures: None ordered today - will let you know if [facility] is an option  Keep up the good work with diet!  See information provided today.
